# Patient Record
Sex: Female | Born: 1975
Health system: Southern US, Community
[De-identification: ages and names within clinical notes are randomized; demographics above are authoritative.]

## PROBLEM LIST (undated history)

## (undated) ENCOUNTER — Ambulatory Visit (HOSPITAL_COMMUNITY): Admission: EM | Payer: Commercial Managed Care - HMO

## (undated) DIAGNOSIS — E669 Obesity, unspecified: Secondary | ICD-10-CM

## (undated) DIAGNOSIS — G510 Bell's palsy: Secondary | ICD-10-CM

## (undated) DIAGNOSIS — E119 Type 2 diabetes mellitus without complications: Secondary | ICD-10-CM

## (undated) DIAGNOSIS — D649 Anemia, unspecified: Secondary | ICD-10-CM

## (undated) DIAGNOSIS — I10 Essential (primary) hypertension: Secondary | ICD-10-CM

## (undated) DIAGNOSIS — M722 Plantar fascial fibromatosis: Secondary | ICD-10-CM

## (undated) HISTORY — PX: WISDOM TOOTH EXTRACTION: SHX21

## (undated) HISTORY — PX: TUBAL LIGATION: SHX77

## (undated) HISTORY — DX: Obesity, unspecified: E66.9

## (undated) HISTORY — PX: DILATION AND CURETTAGE OF UTERUS: SHX78

---

## 2000-07-17 ENCOUNTER — Encounter: Payer: Self-pay | Admitting: Emergency Medicine

## 2000-07-17 ENCOUNTER — Emergency Department (HOSPITAL_COMMUNITY): Admission: EM | Admit: 2000-07-17 | Discharge: 2000-07-17 | Payer: Self-pay | Admitting: Emergency Medicine

## 2000-09-06 ENCOUNTER — Emergency Department (HOSPITAL_COMMUNITY): Admission: EM | Admit: 2000-09-06 | Discharge: 2000-09-06 | Payer: Self-pay

## 2001-02-16 ENCOUNTER — Inpatient Hospital Stay (HOSPITAL_COMMUNITY): Admission: AD | Admit: 2001-02-16 | Discharge: 2001-02-16 | Payer: Self-pay | Admitting: Obstetrics and Gynecology

## 2001-03-10 ENCOUNTER — Inpatient Hospital Stay (HOSPITAL_COMMUNITY): Admission: AD | Admit: 2001-03-10 | Discharge: 2001-03-10 | Payer: Self-pay | Admitting: Obstetrics and Gynecology

## 2001-06-01 ENCOUNTER — Emergency Department (HOSPITAL_COMMUNITY): Admission: EM | Admit: 2001-06-01 | Discharge: 2001-06-02 | Payer: Self-pay | Admitting: Unknown Physician Specialty

## 2001-06-03 ENCOUNTER — Emergency Department (HOSPITAL_COMMUNITY): Admission: EM | Admit: 2001-06-03 | Discharge: 2001-06-03 | Payer: Self-pay | Admitting: Emergency Medicine

## 2001-07-02 ENCOUNTER — Other Ambulatory Visit: Admission: RE | Admit: 2001-07-02 | Discharge: 2001-07-02 | Payer: Self-pay | Admitting: Obstetrics and Gynecology

## 2001-08-07 ENCOUNTER — Inpatient Hospital Stay (HOSPITAL_COMMUNITY): Admission: AD | Admit: 2001-08-07 | Discharge: 2001-08-07 | Payer: Self-pay | Admitting: Obstetrics and Gynecology

## 2001-08-20 ENCOUNTER — Ambulatory Visit (HOSPITAL_COMMUNITY): Admission: RE | Admit: 2001-08-20 | Discharge: 2001-08-20 | Payer: Self-pay | Admitting: Obstetrics and Gynecology

## 2001-08-20 ENCOUNTER — Encounter: Payer: Self-pay | Admitting: Obstetrics and Gynecology

## 2001-11-11 ENCOUNTER — Inpatient Hospital Stay (HOSPITAL_COMMUNITY): Admission: AD | Admit: 2001-11-11 | Discharge: 2001-11-11 | Payer: Self-pay | Admitting: Obstetrics and Gynecology

## 2001-11-20 ENCOUNTER — Inpatient Hospital Stay (HOSPITAL_COMMUNITY): Admission: AD | Admit: 2001-11-20 | Discharge: 2001-11-24 | Payer: Self-pay | Admitting: Obstetrics and Gynecology

## 2001-11-20 ENCOUNTER — Encounter (INDEPENDENT_AMBULATORY_CARE_PROVIDER_SITE_OTHER): Payer: Self-pay

## 2001-11-25 ENCOUNTER — Encounter: Admission: RE | Admit: 2001-11-25 | Discharge: 2001-12-25 | Payer: Self-pay | Admitting: Obstetrics and Gynecology

## 2001-12-26 ENCOUNTER — Encounter: Admission: RE | Admit: 2001-12-26 | Discharge: 2002-01-25 | Payer: Self-pay | Admitting: Obstetrics and Gynecology

## 2002-01-28 ENCOUNTER — Encounter: Payer: Self-pay | Admitting: Obstetrics and Gynecology

## 2002-01-28 ENCOUNTER — Inpatient Hospital Stay (HOSPITAL_COMMUNITY): Admission: AD | Admit: 2002-01-28 | Discharge: 2002-01-28 | Payer: Self-pay | Admitting: Obstetrics and Gynecology

## 2002-02-19 ENCOUNTER — Inpatient Hospital Stay (HOSPITAL_COMMUNITY): Admission: AD | Admit: 2002-02-19 | Discharge: 2002-02-19 | Payer: Self-pay | Admitting: Obstetrics and Gynecology

## 2002-02-22 ENCOUNTER — Encounter: Payer: Self-pay | Admitting: Obstetrics and Gynecology

## 2002-02-22 ENCOUNTER — Ambulatory Visit (HOSPITAL_COMMUNITY): Admission: RE | Admit: 2002-02-22 | Discharge: 2002-02-22 | Payer: Self-pay | Admitting: Obstetrics and Gynecology

## 2002-03-23 ENCOUNTER — Inpatient Hospital Stay (HOSPITAL_COMMUNITY): Admission: AD | Admit: 2002-03-23 | Discharge: 2002-03-23 | Payer: Self-pay | Admitting: Obstetrics and Gynecology

## 2002-06-21 ENCOUNTER — Emergency Department (HOSPITAL_COMMUNITY): Admission: EM | Admit: 2002-06-21 | Discharge: 2002-06-21 | Payer: Self-pay

## 2002-08-18 ENCOUNTER — Emergency Department (HOSPITAL_COMMUNITY): Admission: EM | Admit: 2002-08-18 | Discharge: 2002-08-18 | Payer: Self-pay

## 2002-08-18 ENCOUNTER — Encounter: Payer: Self-pay | Admitting: Emergency Medicine

## 2002-10-03 ENCOUNTER — Other Ambulatory Visit: Admission: RE | Admit: 2002-10-03 | Discharge: 2002-10-03 | Payer: Self-pay | Admitting: Obstetrics and Gynecology

## 2002-10-28 ENCOUNTER — Inpatient Hospital Stay (HOSPITAL_COMMUNITY): Admission: AD | Admit: 2002-10-28 | Discharge: 2002-10-28 | Payer: Self-pay | Admitting: Obstetrics and Gynecology

## 2002-11-13 ENCOUNTER — Inpatient Hospital Stay (HOSPITAL_COMMUNITY): Admission: AD | Admit: 2002-11-13 | Discharge: 2002-11-13 | Payer: Self-pay | Admitting: Obstetrics and Gynecology

## 2002-11-21 ENCOUNTER — Encounter: Payer: Self-pay | Admitting: Obstetrics and Gynecology

## 2002-11-21 ENCOUNTER — Ambulatory Visit (HOSPITAL_COMMUNITY): Admission: RE | Admit: 2002-11-21 | Discharge: 2002-11-21 | Payer: Self-pay | Admitting: Obstetrics and Gynecology

## 2003-02-03 ENCOUNTER — Inpatient Hospital Stay (HOSPITAL_COMMUNITY): Admission: AD | Admit: 2003-02-03 | Discharge: 2003-02-03 | Payer: Self-pay | Admitting: Obstetrics and Gynecology

## 2003-02-13 ENCOUNTER — Inpatient Hospital Stay (HOSPITAL_COMMUNITY): Admission: AD | Admit: 2003-02-13 | Discharge: 2003-02-14 | Payer: Self-pay | Admitting: Obstetrics and Gynecology

## 2003-02-14 ENCOUNTER — Inpatient Hospital Stay (HOSPITAL_COMMUNITY): Admission: AD | Admit: 2003-02-14 | Discharge: 2003-02-14 | Payer: Self-pay | Admitting: Obstetrics and Gynecology

## 2003-02-15 ENCOUNTER — Inpatient Hospital Stay (HOSPITAL_COMMUNITY): Admission: AD | Admit: 2003-02-15 | Discharge: 2003-02-15 | Payer: Self-pay | Admitting: Obstetrics and Gynecology

## 2003-02-18 ENCOUNTER — Inpatient Hospital Stay (HOSPITAL_COMMUNITY): Admission: AD | Admit: 2003-02-18 | Discharge: 2003-02-19 | Payer: Self-pay | Admitting: Obstetrics and Gynecology

## 2003-02-20 ENCOUNTER — Inpatient Hospital Stay (HOSPITAL_COMMUNITY): Admission: AD | Admit: 2003-02-20 | Discharge: 2003-02-20 | Payer: Self-pay | Admitting: Obstetrics and Gynecology

## 2003-03-24 ENCOUNTER — Inpatient Hospital Stay (HOSPITAL_COMMUNITY): Admission: AD | Admit: 2003-03-24 | Discharge: 2003-03-25 | Payer: Self-pay | Admitting: Obstetrics and Gynecology

## 2003-03-26 ENCOUNTER — Inpatient Hospital Stay (HOSPITAL_COMMUNITY): Admission: AD | Admit: 2003-03-26 | Discharge: 2003-03-26 | Payer: Self-pay | Admitting: Obstetrics and Gynecology

## 2003-03-31 ENCOUNTER — Inpatient Hospital Stay (HOSPITAL_COMMUNITY): Admission: AD | Admit: 2003-03-31 | Discharge: 2003-04-02 | Payer: Self-pay | Admitting: Obstetrics and Gynecology

## 2003-07-08 ENCOUNTER — Emergency Department (HOSPITAL_COMMUNITY): Admission: EM | Admit: 2003-07-08 | Discharge: 2003-07-08 | Payer: Self-pay | Admitting: Emergency Medicine

## 2004-05-29 ENCOUNTER — Emergency Department (HOSPITAL_COMMUNITY): Admission: EM | Admit: 2004-05-29 | Discharge: 2004-05-29 | Payer: Self-pay | Admitting: Family Medicine

## 2004-07-12 ENCOUNTER — Emergency Department (HOSPITAL_COMMUNITY): Admission: EM | Admit: 2004-07-12 | Discharge: 2004-07-12 | Payer: Self-pay | Admitting: Emergency Medicine

## 2005-11-29 ENCOUNTER — Emergency Department (HOSPITAL_COMMUNITY): Admission: EM | Admit: 2005-11-29 | Discharge: 2005-11-29 | Payer: Self-pay | Admitting: *Deleted

## 2005-12-27 ENCOUNTER — Emergency Department (HOSPITAL_COMMUNITY): Admission: EM | Admit: 2005-12-27 | Discharge: 2005-12-27 | Payer: Self-pay | Admitting: Emergency Medicine

## 2006-06-09 ENCOUNTER — Other Ambulatory Visit: Admission: RE | Admit: 2006-06-09 | Discharge: 2006-06-09 | Payer: Self-pay | Admitting: Family Medicine

## 2006-08-30 ENCOUNTER — Emergency Department (HOSPITAL_COMMUNITY): Admission: EM | Admit: 2006-08-30 | Discharge: 2006-08-30 | Payer: Self-pay | Admitting: Emergency Medicine

## 2006-08-31 ENCOUNTER — Encounter: Admission: RE | Admit: 2006-08-31 | Discharge: 2006-08-31 | Payer: Self-pay | Admitting: Family Medicine

## 2006-12-07 ENCOUNTER — Emergency Department (HOSPITAL_COMMUNITY): Admission: EM | Admit: 2006-12-07 | Discharge: 2006-12-07 | Payer: Self-pay | Admitting: Emergency Medicine

## 2007-08-26 ENCOUNTER — Emergency Department (HOSPITAL_COMMUNITY): Admission: EM | Admit: 2007-08-26 | Discharge: 2007-08-26 | Payer: Self-pay | Admitting: Emergency Medicine

## 2008-02-26 ENCOUNTER — Emergency Department (HOSPITAL_COMMUNITY): Admission: EM | Admit: 2008-02-26 | Discharge: 2008-02-26 | Payer: Self-pay | Admitting: Emergency Medicine

## 2008-12-13 ENCOUNTER — Emergency Department (HOSPITAL_COMMUNITY): Admission: EM | Admit: 2008-12-13 | Discharge: 2008-12-13 | Payer: Self-pay | Admitting: Family Medicine
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
## 2010-10-31 ENCOUNTER — Emergency Department (HOSPITAL_COMMUNITY)
Admission: EM | Admit: 2010-10-31 | Discharge: 2010-10-31 | Disposition: A | Discharge status: Home / Self Care | Payer: Medicaid Other | Attending: Emergency Medicine | Admitting: Emergency Medicine

## 2010-10-31 DIAGNOSIS — M25539 Pain in unspecified wrist: Secondary | ICD-10-CM | POA: Insufficient documentation

## 2010-10-31 DIAGNOSIS — L2989 Other pruritus: Secondary | ICD-10-CM | POA: Insufficient documentation

## 2010-10-31 DIAGNOSIS — M25439 Effusion, unspecified wrist: Secondary | ICD-10-CM | POA: Insufficient documentation

## 2010-10-31 DIAGNOSIS — IMO0002 Reserved for concepts with insufficient information to code with codable children: Secondary | ICD-10-CM | POA: Insufficient documentation

## 2010-10-31 DIAGNOSIS — L298 Other pruritus: Secondary | ICD-10-CM | POA: Insufficient documentation

## 2010-10-31 DIAGNOSIS — IMO0001 Reserved for inherently not codable concepts without codable children: Secondary | ICD-10-CM | POA: Insufficient documentation

## 2010-12-25 ENCOUNTER — Inpatient Hospital Stay (INDEPENDENT_AMBULATORY_CARE_PROVIDER_SITE_OTHER)
Admission: RE | Admit: 2010-12-25 | Discharge: 2010-12-25 | Disposition: A | Discharge status: Home / Self Care | Payer: Medicaid Other | Source: Ambulatory Visit | Attending: Emergency Medicine | Admitting: Emergency Medicine

## 2010-12-25 DIAGNOSIS — S139XXA Sprain of joints and ligaments of unspecified parts of neck, initial encounter: Secondary | ICD-10-CM

## 2010-12-25 DIAGNOSIS — M799 Soft tissue disorder, unspecified: Secondary | ICD-10-CM

## 2011-02-15 LAB — URINE MICROSCOPIC-ADD ON

## 2011-02-15 LAB — CBC
MCHC: 33.3
MCV: 81.8
RDW: 13.5

## 2011-02-15 LAB — POCT PREGNANCY, URINE: Preg Test, Ur: NEGATIVE

## 2011-02-15 LAB — POCT I-STAT, CHEM 8
Calcium, Ion: 1.16
Creatinine, Ser: 1
Glucose, Bld: 82
Hemoglobin: 13.6
Sodium: 141
TCO2: 27

## 2011-02-15 LAB — URINALYSIS, ROUTINE W REFLEX MICROSCOPIC
Bilirubin Urine: NEGATIVE
Leukocytes, UA: NEGATIVE
Nitrite: NEGATIVE
Specific Gravity, Urine: 1.026
pH: 6

## 2011-02-15 LAB — DIFFERENTIAL
Basophils Absolute: 0.1
Basophils Relative: 1
Eosinophils Absolute: 0.3
Neutro Abs: 3.9
Neutrophils Relative %: 48

## 2011-02-21 LAB — URINALYSIS, ROUTINE W REFLEX MICROSCOPIC
Bilirubin Urine: NEGATIVE
Glucose, UA: NEGATIVE
Ketones, ur: NEGATIVE
Nitrite: NEGATIVE
Protein, ur: NEGATIVE

## 2011-02-21 LAB — CBC
HCT: 36.3
Hemoglobin: 12.2
MCHC: 33.5
Platelets: 300
RDW: 13.4

## 2011-02-21 LAB — POCT I-STAT, CHEM 8
BUN: 5 — ABNORMAL LOW
Calcium, Ion: 1.12
HCT: 38
Hemoglobin: 12.9
Sodium: 139
TCO2: 27

## 2011-02-21 LAB — DIFFERENTIAL
Basophils Absolute: 0
Basophils Relative: 1
Eosinophils Relative: 4
Lymphocytes Relative: 43
Monocytes Absolute: 0.5

## 2011-02-21 LAB — GLUCOSE, CAPILLARY

## 2011-02-25 ENCOUNTER — Other Ambulatory Visit: Payer: Self-pay | Admitting: Family Medicine

## 2011-02-25 DIAGNOSIS — Z803 Family history of malignant neoplasm of breast: Secondary | ICD-10-CM

## 2011-02-25 DIAGNOSIS — Z1231 Encounter for screening mammogram for malignant neoplasm of breast: Secondary | ICD-10-CM

## 2011-03-23 ENCOUNTER — Ambulatory Visit: Payer: Medicaid Other

## 2011-09-11 ENCOUNTER — Emergency Department (HOSPITAL_COMMUNITY)
Admission: EM | Admit: 2011-09-11 | Discharge: 2011-09-12 | Disposition: A | Discharge status: Home / Self Care | Payer: Medicaid Other | Attending: Emergency Medicine | Admitting: Emergency Medicine

## 2011-09-11 ENCOUNTER — Encounter (HOSPITAL_COMMUNITY): Payer: Self-pay | Admitting: Emergency Medicine

## 2011-09-11 DIAGNOSIS — IMO0002 Reserved for concepts with insufficient information to code with codable children: Secondary | ICD-10-CM | POA: Insufficient documentation

## 2011-09-11 DIAGNOSIS — S0093XA Contusion of unspecified part of head, initial encounter: Secondary | ICD-10-CM

## 2011-09-11 DIAGNOSIS — S0003XA Contusion of scalp, initial encounter: Secondary | ICD-10-CM | POA: Insufficient documentation

## 2011-09-11 DIAGNOSIS — R51 Headache: Secondary | ICD-10-CM | POA: Insufficient documentation

## 2011-09-11 DIAGNOSIS — Y92009 Unspecified place in unspecified non-institutional (private) residence as the place of occurrence of the external cause: Secondary | ICD-10-CM | POA: Insufficient documentation

## 2011-09-11 DIAGNOSIS — M542 Cervicalgia: Secondary | ICD-10-CM | POA: Insufficient documentation

## 2011-09-11 DIAGNOSIS — S139XXA Sprain of joints and ligaments of unspecified parts of neck, initial encounter: Secondary | ICD-10-CM | POA: Insufficient documentation

## 2011-09-11 DIAGNOSIS — S161XXA Strain of muscle, fascia and tendon at neck level, initial encounter: Secondary | ICD-10-CM

## 2011-09-11 NOTE — ED Notes (Signed)
Pt states she was moving into a new apartment and a cabinet fell and hit the L side of her head/forehead.  C/o pain to head and neck pain.  Denies LOC.

## 2011-09-12 ENCOUNTER — Emergency Department (HOSPITAL_COMMUNITY): Payer: Medicaid Other

## 2011-09-12 ENCOUNTER — Encounter (HOSPITAL_COMMUNITY): Payer: Self-pay | Admitting: Radiology

## 2011-09-12 MED ORDER — OXYCODONE-ACETAMINOPHEN 5-325 MG PO TABS: 2.0000 | ORAL_TABLET | Freq: Once | ORAL | Status: AC

## 2011-09-12 MED ORDER — METAXALONE 800 MG PO TABS: 400.0000 mg | ORAL_TABLET | Freq: Three times a day (TID) | ORAL | Status: AC

## 2011-09-12 MED ORDER — OXYCODONE-ACETAMINOPHEN 5-325 MG PO TABS
2.0000 | ORAL_TABLET | Freq: Once | ORAL | Status: AC
  Administered 2011-09-12: 2 via ORAL
  Filled 2011-09-12: qty 2

## 2011-09-12 NOTE — ED Notes (Signed)
Patient transported to X-ray 

## 2011-09-12 NOTE — ED Provider Notes (Signed)
Medical screening examination/treatment/procedure(s) were performed by non-physician practitioner and as supervising physician I was immediately available for consultation/collaboration.  Jasmine Awe, MD 09/12/11 [phone]
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
## 2011-09-12 NOTE — ED Provider Notes (Signed)
History     CSN: [id number]  Arrival date & time 09/11/11  2225   First MD Initiated Contact with Patient 09/11/11 2358      Chief Complaint  Patient presents with  . Head Injury    (Consider location/radiation/quality/duration/timing/severity/associated sxs/prior treatment) HPI Comments: Patient states she was moving into a new apartment when a wall.  Cabinet fell, striking her on the left for head.  She is now complaining of headache, nausea, that has resolved, and neck pain.  She has not any over-the-counter medication, denies loss of consciousness  Patient is a 36 y.o. female presenting with head injury. The history is provided by the patient.  Head Injury  The incident occurred 1 to 2 hours ago. She came to the ER via walk-in. The injury mechanism was a direct blow. There was no loss of consciousness. There was no blood loss. The quality of the pain is described as throbbing. The pain is at a severity of 6/10. The pain is moderate. The pain has been constant since the injury. Pertinent negatives include no vomiting, no weakness and no memory loss. She has tried nothing for the symptoms.    History reviewed. No pertinent past medical history.  Past Surgical History  Procedure Date  . Tubal ligation     No family history on file.  History  Substance Use Topics  . Smoking status: Never Smoker   . Smokeless tobacco: Not on file  . Alcohol Use: No    OB History    Grav Para Term Preterm Abortions TAB SAB Ect Mult Living                  Review of Systems  Constitutional: Negative for fever.  Eyes: Negative for visual disturbance.  Gastrointestinal: Negative for vomiting.  Musculoskeletal: Negative for joint swelling.  Neurological: Positive for headaches. Negative for dizziness and weakness.  Psychiatric/Behavioral: Negative for memory loss.    Allergies  Review of patient's allergies indicates no known allergies.  Home Medications  No current outpatient  prescriptions on file.  BP 124/81  Pulse 83  Temp(Src) 98.5 F (36.9 C) (Oral)  Resp 18  SpO2 100%  LMP 09/09/2011  Physical Exam  Constitutional: She is oriented to person, place, and time. She appears well-developed and well-nourished.  HENT:  Head: Normocephalic. Head is with contusion. Head is without abrasion and without laceration.  Neck: Spinous process tenderness present.  Cardiovascular: Normal rate.   Pulmonary/Chest: Effort normal.  Abdominal: Soft.  Musculoskeletal: Normal range of motion.  Neurological: She is alert and oriented to person, place, and time.  Skin: Skin is warm.    ED Course  Procedures (including critical care time)  Labs Reviewed - No data to display No results found.   No diagnosis found.    MDM  Patient has midline cervical spine tenderness and a small hematoma on the left for head.  Will CT head and neck to evaluate for intracranial injury, as well as cervical spine injury, although this is doubtful.  While patient is awaiting scans and x-ray will provide pain medication in the form of Percocet CT scan and x-rays are normal       Arman Filter, NP 09/12/11 0058  Arman Filter, NP 09/12/11 0100

## 2012-11-09 ENCOUNTER — Encounter (HOSPITAL_COMMUNITY): Payer: Self-pay | Admitting: Emergency Medicine

## 2012-11-09 ENCOUNTER — Emergency Department (INDEPENDENT_AMBULATORY_CARE_PROVIDER_SITE_OTHER): Payer: Medicaid Other

## 2012-11-09 ENCOUNTER — Emergency Department (HOSPITAL_COMMUNITY)
Admission: EM | Admit: 2012-11-09 | Discharge: 2012-11-09 | Disposition: A | Discharge status: Home / Self Care | Payer: Medicaid Other | Source: Home / Self Care | Attending: Family Medicine | Admitting: Family Medicine

## 2012-11-09 DIAGNOSIS — S9030XA Contusion of unspecified foot, initial encounter: Secondary | ICD-10-CM

## 2012-11-09 DIAGNOSIS — S9032XA Contusion of left foot, initial encounter: Secondary | ICD-10-CM

## 2012-11-09 HISTORY — DX: Plantar fascial fibromatosis: M72.2

## 2012-11-09 MED ORDER — IBUPROFEN 600 MG PO TABS: 600.0000 mg | ORAL_TABLET | Freq: Three times a day (TID) | ORAL | Status: DC | PRN

## 2012-11-09 MED ORDER — TRAMADOL HCL 50 MG PO TABS: 50.0000 mg | ORAL_TABLET | Freq: Four times a day (QID) | ORAL | Status: DC | PRN

## 2012-11-09 NOTE — ED Notes (Signed)
Left foot pain and swelling .  Pain in top of left foot, at the base of toes, into great toe.  Patient reports dropping can on left foot.

## 2012-11-09 NOTE — ED Provider Notes (Signed)
History     CSN: [id number]  Arrival date & time 11/09/12  1303   First MD Initiated Contact with Patient 11/09/12 1339      Chief Complaint  Patient presents with  . Foot Pain    (Consider location/radiation/quality/duration/timing/severity/associated sxs/prior treatment) HPI Comments: 37 year old female here complaining of left foot pain and swelling. Patient states she had it can drop over her left foot about a week ago. Area has been stat tender and swollen things. Not taking any medications for her symptoms. Uncomfortable with walking but no limping.  Patient is a 37 y.o. female presenting with lower extremity pain.  Foot Pain    Past Medical History  Diagnosis Date  . Plantar fasciitis, bilateral     Past Surgical History  Procedure Laterality Date  . Tubal ligation      No family history on file.  History  Substance Use Topics  . Smoking status: Never Smoker   . Smokeless tobacco: Not on file  . Alcohol Use: No    OB History   Grav Para Term Preterm Abortions TAB SAB Ect Mult Living                  Review of Systems  Constitutional: Negative for fever and chills.  Musculoskeletal:       Left foot pain as per HPI  Skin: Negative for wound.  Neurological: Negative for weakness and numbness.    Allergies  Review of patient's allergies indicates no known allergies.  Home Medications   Current Outpatient Rx  Name  Route  Sig  Dispense  Refill  . acetaminophen (TYLENOL) 325 MG tablet   Oral   Take 650 mg by mouth every 6 (six) hours as needed for pain.         Marland Kitchen ibuprofen (ADVIL,MOTRIN) 600 MG tablet   Oral   Take 1 tablet (600 mg total) by mouth every 8 (eight) hours as needed for pain (take with food).   21 tablet   0   . traMADol (ULTRAM) 50 MG tablet   Oral   Take 1 tablet (50 mg total) by mouth every 6 (six) hours as needed for pain.   15 tablet   0     BP 128/83  Pulse 90  Temp(Src) 98.3 F (36.8 C)  Resp 18  SpO2 99%   LMP 11/09/2012  Physical Exam  Nursing note and vitals reviewed. Constitutional: She is oriented to person, place, and time. She appears well-developed and well-nourished. No distress.  Cardiovascular: Normal heart sounds.   Pulmonary/Chest: Breath sounds normal.  Musculoskeletal:  Left foot: no swelling or deformity when compared with right foot. Reported diffused tenderness with palpation over lateral dorsum of the foot pain increased with dorsiflexion. No crepitus or irregularities. No ankle laxity. Entire right foot appears neurovascularly intact.   Neurological: She is alert and oriented to person, place, and time.  Skin: No rash noted. She is not diaphoretic.  No bruising, rash, abrasions or wounds.    ED Course  Procedures (including critical care time)  Labs Reviewed - No data to display Dg Foot Complete Left  11/09/2012   *RADIOLOGY REPORT*  Clinical Data: Injury to left foot.  Left foot pain.  LEFT FOOT - COMPLETE 3+ VIEW  Comparison: None.  Findings: No fracture.  The joints normally spaced and aligned. The soft tissues are unremarkable.  IMPRESSION: No fracture or joint abnormality.   Original Report Authenticated By: Amie Portland, M.D.     1.  Foot contusion, left, initial encounter       MDM  No significant swelling compared with right foot. No fractures on x-rays. Placed on rigid soled shoe. Prescribed ibuprofen and tramadol.     Sharin Grave, MD 11/10/12 1652

## 2013-01-24 ENCOUNTER — Emergency Department (HOSPITAL_COMMUNITY): Payer: Medicaid Other

## 2013-01-24 ENCOUNTER — Encounter (HOSPITAL_COMMUNITY): Payer: Self-pay | Admitting: Neurology

## 2013-01-24 ENCOUNTER — Emergency Department (HOSPITAL_COMMUNITY)
Admission: EM | Admit: 2013-01-24 | Discharge: 2013-01-24 | Disposition: A | Discharge status: Home / Self Care | Payer: Medicaid Other | Attending: Emergency Medicine | Admitting: Emergency Medicine

## 2013-01-24 DIAGNOSIS — R300 Dysuria: Secondary | ICD-10-CM | POA: Insufficient documentation

## 2013-01-24 DIAGNOSIS — Z8739 Personal history of other diseases of the musculoskeletal system and connective tissue: Secondary | ICD-10-CM | POA: Insufficient documentation

## 2013-01-24 DIAGNOSIS — R1011 Right upper quadrant pain: Secondary | ICD-10-CM | POA: Insufficient documentation

## 2013-01-24 DIAGNOSIS — Z9851 Tubal ligation status: Secondary | ICD-10-CM | POA: Insufficient documentation

## 2013-01-24 DIAGNOSIS — R112 Nausea with vomiting, unspecified: Secondary | ICD-10-CM

## 2013-01-24 DIAGNOSIS — R1013 Epigastric pain: Secondary | ICD-10-CM | POA: Insufficient documentation

## 2013-01-24 HISTORY — DX: Type 2 diabetes mellitus without complications: E11.9

## 2013-01-24 LAB — COMPREHENSIVE METABOLIC PANEL
ALT: 16 U/L (ref 0–35)
AST: 15 U/L (ref 0–37)
Albumin: 3.6 g/dL (ref 3.5–5.2)
Alkaline Phosphatase: 71 U/L (ref 39–117)
Calcium: 9.1 mg/dL (ref 8.4–10.5)
GFR calc Af Amer: >90 mL/min (ref >90)
Potassium: 4.3 mEq/L (ref 3.5–5.1)
Sodium: 136 mEq/L (ref 135–145)
Total Protein: 7.7 g/dL (ref 6.0–8.3)

## 2013-01-24 LAB — URINALYSIS, ROUTINE W REFLEX MICROSCOPIC
Leukocytes, UA: NEGATIVE
Nitrite: NEGATIVE
Specific Gravity, Urine: 1.015 (ref 1.005–1.030)
Urobilinogen, UA: 0.2 mg/dL (ref 0.0–1.0)
pH: 6.5 (ref 5.0–8.0)

## 2013-01-24 LAB — CBC WITH DIFFERENTIAL/PLATELET
Basophils Relative: 0 % (ref 0–1)
Eosinophils Relative: 4 % (ref 0–5)
HCT: 39 % (ref 36.0–46.0)
Hemoglobin: 12.9 g/dL (ref 12.0–15.0)
MCHC: 33.1 g/dL (ref 30.0–36.0)
MCV: 81.6 fL (ref 78.0–100.0)
Monocytes Absolute: 0.5 10*3/uL (ref 0.1–1.0)
Monocytes Relative: 6 % (ref 3–12)
Neutro Abs: 3.2 10*3/uL (ref 1.7–7.7)

## 2013-01-24 LAB — WET PREP, GENITAL
Clue Cells Wet Prep HPF POC: NONE SEEN
Trich, Wet Prep: NONE SEEN

## 2013-01-24 LAB — URINE MICROSCOPIC-ADD ON

## 2013-01-24 LAB — POCT PREGNANCY, URINE: Preg Test, Ur: NEGATIVE

## 2013-01-24 MED ORDER — ONDANSETRON HCL 4 MG PO TABS: 4.0000 mg | ORAL_TABLET | Freq: Three times a day (TID) | ORAL | Status: DC | PRN

## 2013-01-24 MED ORDER — IOHEXOL 300 MG/ML  SOLN
25.0000 mL | INTRAMUSCULAR | Status: AC
  Administered 2013-01-24: 25 mL via ORAL

## 2013-01-24 MED ORDER — IOHEXOL 300 MG/ML  SOLN
100.0000 mL | Freq: Once | INTRAMUSCULAR | Status: AC | PRN
  Administered 2013-01-24: 80 mL via INTRAVENOUS

## 2013-01-24 NOTE — ED Notes (Signed)
Pt discharged.Vital signs stable and GCS 15 

## 2013-01-24 NOTE — ED Provider Notes (Signed)
I saw and evaluated the patient, reviewed the resident's note and I agree with the findings and plan.   Dagmar Hait, MD 01/24/13 [phone]

## 2013-01-24 NOTE — ED Notes (Signed)
Pt reporting RLQ side, flank and back pain since this morning. Also pain when she urinates. Yesterday was vomiting. Today is nauseated. Pt is a x 4

## 2013-01-24 NOTE — ED Provider Notes (Signed)
CSN: [id number]     Arrival date & time 01/24/13  0759 History     Chief Complaint  Patient presents with  . Flank Pain   HPI Comments: Pt is a 37 y/o female with PMHx of D&C in 1999 w/ Tubal Ligation who presents to the ED with RUQ pain, R flank pain, and suprapubic/groin pain with urination.  Pt was in her normal state of health up until two days ago when she started to not feel well.  Yesterday, pt became nauseated and had multiple episodes of nonbloody/non bilious vomitus, abdominal pain.  She was not able to keep PO down and progressed to have suprapubic pain and dysuria today when she decided to come into the ED.  She denies sick contacts, recent travel, fever, chills, sweats, CVA pain, pain with deep inspiration or referred to her shoulder, hematuria, melena, hematochezia, diarrhea, constipation, RUQ pain with fatty meals, vague periumbilical pain with radiation to the RLQ, recent vaginal discharge/sexual intercourse.  She does not have Hx of appendectomy or cholecystectomy.  Does state her menses are irregular and her FDLMP was 01/10/13, but thought maybe the pain was related to the onset.    The history is provided by the patient.    Past Medical History  Diagnosis Date  . Plantar fasciitis, bilateral    Past Surgical History  Procedure Laterality Date  . Tubal ligation     No family history on file. History  Substance Use Topics  . Smoking status: Never Smoker   . Smokeless tobacco: Not on file  . Alcohol Use: No   OB History   Grav Para Term Preterm Abortions TAB SAB Ect Mult Living                 Review of Systems  Constitutional: Positive for appetite change. Negative for fever, chills, diaphoresis, fatigue and unexpected weight change.  Eyes: Negative.   Respiratory: Negative.   Cardiovascular: Negative.   Gastrointestinal: Positive for nausea, vomiting and abdominal pain (epigastric, RUQ, suprapubic, radiation to R groin). Negative for diarrhea, constipation, blood  in stool, abdominal distention, anal bleeding and rectal pain.  Endocrine: Negative.   Genitourinary: Positive for dysuria and flank pain. Negative for frequency, hematuria, decreased urine volume, vaginal bleeding, vaginal discharge, difficulty urinating, vaginal pain, menstrual problem and pelvic pain.  Musculoskeletal: Negative.   Skin: Negative.   Allergic/Immunologic: Negative.   Neurological: Negative.  Negative for headaches.  Hematological: Negative.   Psychiatric/Behavioral: Negative.     Allergies  Review of patient's allergies indicates no known allergies.  Home Medications   No current outpatient prescriptions on file. There were no vitals taken for this visit. Physical Exam  Constitutional: She is oriented to person, place, and time. She appears well-developed and well-nourished. No distress.  HENT:  Head: Normocephalic and atraumatic.  Mouth/Throat: Oropharynx is clear and moist.  Eyes: EOM are normal. No scleral icterus.  Neck: Normal range of motion. Neck supple.  Cardiovascular: Normal rate, regular rhythm and normal heart sounds.   No murmur heard. Pulmonary/Chest: Effort normal and breath sounds normal.  Abdominal: Soft. Bowel sounds are normal. She exhibits no distension and no pulsatile liver. There is no hepatosplenomegaly. There is tenderness (RUQ (worse with inspiration, but no cessation), epigastric, suprapubic with slight radiation to R groin/RLQ). There is positive Murphy's sign. There is no rigidity, no rebound, no guarding, no CVA tenderness and no tenderness at McBurney's point. No hernia.  Negative Rovsing Sign   Genitourinary: Vagina normal and uterus normal.  There is no rash, tenderness, lesion or injury on the right labia. There is no rash, tenderness, lesion or injury on the left labia. Cervix exhibits no motion tenderness, no discharge and no friability. Right adnexum displays no mass, no tenderness and no fullness. Left adnexum displays no mass, no  tenderness and no fullness.  Musculoskeletal: Normal range of motion.  Lymphadenopathy:    She has no cervical adenopathy.  Neurological: She is alert and oriented to person, place, and time.  Skin: Skin is warm and dry. No rash noted. No erythema.  Psychiatric: She has a normal mood and affect. Her behavior is normal.    ED Course  Pelvic exam Date/Time: 01/24/2013 8:43 AM Performed by: Briscoe Deutscher Authorized by: Dagmar Hait Consent: Verbal consent obtained. written consent not obtained. Risks and benefits: risks, benefits and alternatives were discussed Consent given by: patient Required items: required blood products, implants, devices, and special equipment available Patient identity confirmed: verbally with patient Local anesthesia used: no Patient sedated: no Patient tolerance: Patient tolerated the procedure well with no immediate complications.   (including critical care time) Labs Review Labs Reviewed  WET PREP, GENITAL - Abnormal; Notable for the following:    WBC, Wet Prep HPF POC MANY (*)    All other components within normal limits  URINALYSIS, ROUTINE W REFLEX MICROSCOPIC - Abnormal; Notable for the following:    Hgb urine dipstick SMALL (*)    All other components within normal limits  COMPREHENSIVE METABOLIC PANEL - Abnormal; Notable for the following:    Glucose, Bld 108 (*)    GFR calc non Af Amer 88 (*)    All other components within normal limits  CBC WITH DIFFERENTIAL - Abnormal; Notable for the following:    Neutrophils Relative % 42 (*)    Lymphocytes Relative 47 (*)    All other components within normal limits  URINE MICROSCOPIC-ADD ON - Abnormal; Notable for the following:    Squamous Epithelial / LPF FEW (*)    All other components within normal limits  LIPASE, BLOOD  MISCELLANEOUS TEST  POCT PREGNANCY, URINE  CERVICOVAGINAL ANCILLARY ONLY   Imaging Review US Abdomen Complete  01/24/2013   *RADIOLOGY REPORT*  Abdominal ultrasound   History: Abdominal pain  Comparison: CT abdomen September 01, 2006  Findings:  Gallbladder is visualized in multiple projections. There are no gallstones, gallbladder wall thickening, or pericholecystic fluid collection.  There is no intrahepatic, common hepatic, or common bile duct dilatation.  Pancreas appears normal.  There is diffuse increased echogenicity throughout the liver.  No focal liver lesions are identified on this study.  Spleen is normal in size and homogeneous in echotexture.  Kidneys bilaterally appear normal.  There is no ascites.  Aorta is nonaneurysmal.  Inferior vena cava appears normal.  Conclusion:  Diffuse increased echogenicity of the liver, probably indicative of diffuse fatty change.  While no focal liver lesions are identified, it must be cautioned that the sensitivity of ultrasound for focal liver lesions is diminished given this degree of increased echogenicity.  Study otherwise unremarkable.   Original Report Authenticated By: Bretta Bang, M.D.   Ct Abdomen Pelvis W Contrast  01/24/2013   *RADIOLOGY REPORT*  Clinical Data: Right lower quadrant pain and right flank pain since early this morning.  Pain upon urination.  Nausea vomiting.  Denies fever.  History of tubal ligation.  CT ABDOMEN AND PELVIS WITH CONTRAST  Technique:  Multidetector CT imaging of the abdomen and pelvis was performed following  the standard protocol during bolus administration of intravenous contrast.  Contrast: 80mL OMNIPAQUE IOHEXOL 300 MG/ML  SOLN  Comparison: 09/01/2006.  Findings: No extraluminal bowel inflammatory process, free fluid or free air.  Specifically, no inflammation noted surrounding the appendix.  Portions of the bowel are under distended with slight wall thickening (ascending colon).  Elongated fatty liver without focal worrisome hepatic lesion.  No splenic, pancreatic, renal or adrenal abnormality.  Distended gallbladder without calcified gallstones or CT evidence of inflammation.  If this  were of high clinical concern then ultrasound can be obtained for further delineation.  Lung bases clear.  No abdominal aortic aneurysm.  Tiny calcification in the left internal iliac artery may represent slightly advanced atherosclerotic type changes for the patient's age.  No adenopathy.  No bowel containing hernia.  Noncontrast filled views of the urinary bladder unremarkable. Uterus unremarkable.  Post tubal ligation.  Bilateral ovarian cysts measuring up to 3.9 cm on the right with left ovary containing 2 cysts side-by-side or single cyst with a septation measuring up to 4.7 cm.  This can be evaluated by follow- up pelvic sonogram if clinically desired.  No bony destructive lesion.  Mild sacroiliac joint degenerative changes.  IMPRESSION: No extraluminal bowel inflammatory process, free fluid or free air. Specifically, no inflammation noted surrounding the appendix.  Elongated fatty liver without focal worrisome hepatic lesion.  Distended gallbladder without calcified gallstones or CT evidence of inflammation.  Tiny calcification in the left internal iliac artery may represent slightly advanced atherosclerotic type changes for the patient's age.  Bilateral ovarian cysts measuring up to 3.9 cm on the right with left ovary containing 2 cysts side-by-side or single cyst with a septation measuring up to 4.7 cm.  This can be evaluated by follow- up pelvic sonogram if clinically desired.   Original Report Authenticated By: Lacy Duverney, M.D.    MDM   1. RUQ abdominal pain   2. Nausea & vomiting   Differential is quite broad and includes GI source including viral/bacterial gastroenteritis (no fever, bloody stool), cholecystitis, biliary colic, hepatic source, pancreatitis, appendicitis.  Will get Lipase, CMET, RUQ [place] as cholecystitis/biliary colic are high on the differential.  If these are negative, will consider CT abdomen to r/o appendicitis.  GU complaints include pyelo (no systemic Sx), UTI, STD, BV,  pregnancy.  Will get U preg, UA, GC/Chlamydia, and do speculum/manual exam to r/o possible causes.    9:30 AM - [place] negative for cholecystitis or chololithiasis, Labs unremarkable to this point.  Speculum exam w/o frank pus, and no CMT.  Will get CT abdomen w/ contrast to r/o appendicitis as pt continuing to have some RLQ tenderness.   12:15 PM - CT abdomen pelvis w/o free fluid or appendix inflammation.  Most likely viral gastro or biliary colic causing her pain/nausea.  Will tx symptomatically with zofran and ibuprofen/tylenol PRN. Pt will be d/c home w/ specific instructions on when to return and when to follow up with her PCP.   Twana First Paulina Fusi, DO of [facility] 01/24/2013, 12:16 PM      Briscoe Deutscher, DO 01/24/13 1216

## 2013-01-25 LAB — MISCELLANEOUS TEST: Miscellaneous Test: 5990

## 2013-05-14 ENCOUNTER — Emergency Department (HOSPITAL_COMMUNITY)
Admission: EM | Admit: 2013-05-14 | Discharge: 2013-05-14 | Disposition: A | Discharge status: Home / Self Care | Payer: Medicaid Other | Attending: Emergency Medicine | Admitting: Emergency Medicine

## 2013-05-14 ENCOUNTER — Encounter (HOSPITAL_COMMUNITY): Payer: Self-pay | Admitting: Emergency Medicine

## 2013-05-14 ENCOUNTER — Emergency Department (HOSPITAL_COMMUNITY): Payer: Medicaid Other

## 2013-05-14 DIAGNOSIS — R209 Unspecified disturbances of skin sensation: Secondary | ICD-10-CM | POA: Insufficient documentation

## 2013-05-14 DIAGNOSIS — E119 Type 2 diabetes mellitus without complications: Secondary | ICD-10-CM | POA: Insufficient documentation

## 2013-05-14 DIAGNOSIS — R002 Palpitations: Secondary | ICD-10-CM | POA: Insufficient documentation

## 2013-05-14 DIAGNOSIS — R0789 Other chest pain: Secondary | ICD-10-CM | POA: Insufficient documentation

## 2013-05-14 LAB — POCT I-STAT, CHEM 8
Calcium, Ion: 1.19 mmol/L (ref 1.12–1.23)
Creatinine, Ser: 0.8 mg/dL (ref 0.50–1.10)
Glucose, Bld: 102 mg/dL — ABNORMAL HIGH (ref 70–99)
Hemoglobin: 12.6 g/dL (ref 12.0–15.0)
Potassium: 3.7 mEq/L (ref 3.5–5.1)
TCO2: 22 mmol/L (ref 0–100)

## 2013-05-14 MED ORDER — IBUPROFEN 800 MG PO TABS
800.0000 mg | ORAL_TABLET | Freq: Once | ORAL | Status: AC
  Administered 2013-05-14: 800 mg via ORAL
  Filled 2013-05-14: qty 1

## 2013-05-14 NOTE — ED Provider Notes (Signed)
CSN: [id number]     Arrival date & time 05/14/13  0203 History   First MD Initiated Contact with Patient 05/14/13 0316     Chief Complaint  Patient presents with  . Hyperventilating   (Consider location/radiation/quality/duration/timing/severity/associated sxs/prior Treatment) HPI Comments: 37 yo female with panic attack hx presents after episode of chest pressure, heart racing, numbness both arms and breathing fast.  Sxs have resolved on exam except mild chest pressure.  No cardiac risks.  No PE or recent surgery.  Non radiating, mild anterior.  No exertional or diaphoresis.  No leg swelling or pain.  No cardiac hx.   The history is provided by the patient.    Past Medical History  Diagnosis Date  . Plantar fasciitis, bilateral   . Diabetes mellitus without complication    Past Surgical History  Procedure Laterality Date  . Tubal ligation    . Dilation and curettage of uterus     No family history on file. History  Substance Use Topics  . Smoking status: Never Smoker   . Smokeless tobacco: Not on file  . Alcohol Use: No   OB History   Grav Para Term Preterm Abortions TAB SAB Ect Mult Living                 Review of Systems  Constitutional: Negative for fever and chills.  HENT: Negative for congestion.   Eyes: Negative for visual disturbance.  Respiratory: Negative for cough and shortness of breath.   Cardiovascular: Positive for chest pain (pressure) and palpitations.  Gastrointestinal: Negative for vomiting, abdominal pain and blood in stool.  Genitourinary: Negative for dysuria and flank pain.  Musculoskeletal: Negative for back pain, neck pain and neck stiffness.  Skin: Negative for rash.  Neurological: Positive for numbness. Negative for light-headedness and headaches.    Allergies  Bee venom  Home Medications   Current Outpatient Rx  Name  Route  Sig  Dispense  Refill  . EPINEPHrine (EPIPEN) 0.3 mg/0.3 mL SOAJ injection   Intramuscular   Inject 0.3 mg  into the muscle once as needed (for allergic reaction).          BP 115/72  Pulse 81  Temp(Src) 98.5 F (36.9 C) (Oral)  Resp 17  SpO2 98% Physical Exam  Nursing note and vitals reviewed. Constitutional: She is oriented to person, place, and time. She appears well-developed and well-nourished.  HENT:  Head: Normocephalic and atraumatic.  Eyes: Conjunctivae are normal. Right eye exhibits no discharge. Left eye exhibits no discharge.  Neck: Normal range of motion. Neck supple. No tracheal deviation present.  Cardiovascular: Normal rate, regular rhythm and intact distal pulses.   Pulmonary/Chest: Effort normal and breath sounds normal.  Abdominal: Soft. She exhibits no distension. There is no tenderness. There is no guarding.  Musculoskeletal: She exhibits no edema and no tenderness.  Neurological: She is alert and oriented to person, place, and time. No cranial nerve deficit.  Skin: Skin is warm. No rash noted.  Psychiatric: She has a normal mood and affect.    ED Course  Procedures (including critical care time) Labs Review Labs Reviewed  POCT I-STAT, CHEM 8 - Abnormal; Notable for the following:    Glucose, Bld 102 (*)    All other components within normal limits  POCT I-STAT TROPONIN I   Imaging Review Dg Chest 2 View  05/14/2013   CLINICAL DATA:  Chest pain, tachypnea  EXAM: CHEST  2 VIEW  COMPARISON:  Prior radiograph from 09/29/2006  FINDINGS: The cardiac and mediastinal silhouettes are stable in size and contour, and remain within normal limits.  The lungs are normally inflated. No airspace consolidation, pleural effusion, or pulmonary edema is identified. There is no pneumothorax.  No acute osseous abnormality identified.  IMPRESSION: No active cardiopulmonary disease.   Electronically Signed   By: Rise Mu M.D.   On: 05/14/2013 06:17    EKG Interpretation    Date/Time:  Tuesday May 14 2013 02:19:17 EST Ventricular Rate:  80 PR Interval:  144 QRS  Duration: 83 QT Interval:  401 QTC Calculation: 463 R Axis:   54 Text Interpretation:  Sinus rhythm Confirmed by Tenya Araque  MD, Zerrick Hanssen ([id number]) on 05/14/2013 4:55:28 AM            MDM   1. Chest pressure    Well appearing. Clinically panic attack with multiple sxs. Low risk cardiac. Very low risk PE, PERC neg, no ddimer indicated. Screening ekg and troponin unremarkable. Close fup outpt.  Pain improved on recheck, well appearing.   Discussed outpt fup. And possible stress test if recurs.  .Results and differential diagnosis were discussed with the patient. Close follow up outpatient was discussed, patient comfortable with the plan.   Diagnosis: Chest pressure, anxiety   Enid Skeens, MD 05/14/13 [phone]

## 2013-05-14 NOTE — ED Notes (Signed)
Per EMS pt was at work when she started to feel weak, chest pressure, fatigue, numbness and tingling in her arms and legs, and began to hyperventilate. EMS was called and upon initial assessment pt's RR was 60 and HR was 120. Pt's HR and RR decreased upon transition to the department, HR 83, RR 22. EMS reports pt has a hx of anxiety but states that this feels different, pt reports her last anxiety attack was 10 years ago. Pt is A&O X4.

## 2013-05-20 ENCOUNTER — Other Ambulatory Visit (HOSPITAL_COMMUNITY): Payer: Self-pay | Admitting: Family Medicine

## 2013-05-20 DIAGNOSIS — E669 Obesity, unspecified: Secondary | ICD-10-CM

## 2013-05-20 DIAGNOSIS — R0789 Other chest pain: Secondary | ICD-10-CM

## 2013-05-22 ENCOUNTER — Ambulatory Visit (HOSPITAL_COMMUNITY)
Admission: RE | Admit: 2013-05-22 | Discharge: 2013-05-22 | Disposition: A | Discharge status: Home / Self Care | Payer: Medicaid Other | Source: Ambulatory Visit | Attending: Family Medicine | Admitting: Family Medicine

## 2013-05-22 DIAGNOSIS — R0789 Other chest pain: Secondary | ICD-10-CM

## 2013-05-22 DIAGNOSIS — R079 Chest pain, unspecified: Secondary | ICD-10-CM | POA: Insufficient documentation

## 2013-05-22 DIAGNOSIS — E669 Obesity, unspecified: Secondary | ICD-10-CM

## 2013-06-03 ENCOUNTER — Ambulatory Visit (HOSPITAL_COMMUNITY)
Admission: RE | Admit: 2013-06-03 | Discharge: 2013-06-03 | Disposition: A | Discharge status: Home / Self Care | Payer: Medicaid Other | Source: Ambulatory Visit | Attending: Cardiology | Admitting: Cardiology

## 2013-06-03 DIAGNOSIS — E119 Type 2 diabetes mellitus without complications: Secondary | ICD-10-CM | POA: Insufficient documentation

## 2013-06-03 DIAGNOSIS — R0789 Other chest pain: Secondary | ICD-10-CM

## 2013-06-03 DIAGNOSIS — R079 Chest pain, unspecified: Secondary | ICD-10-CM | POA: Insufficient documentation

## 2013-06-03 DIAGNOSIS — E669 Obesity, unspecified: Secondary | ICD-10-CM

## 2013-06-03 NOTE — Progress Notes (Signed)
2D Echo Performed 06/03/2013    Findley Vi, RCS  

## 2013-06-03 NOTE — Progress Notes (Deleted)
2D Echo Performed 06/03/2013    Jaxiel Kines, RCS  

## 2013-08-07 ENCOUNTER — Ambulatory Visit: Payer: Medicaid Other | Admitting: *Deleted

## 2013-08-19 ENCOUNTER — Encounter: Payer: Self-pay | Admitting: Dietician

## 2013-08-19 ENCOUNTER — Encounter: Payer: Medicaid Other | Attending: Family Medicine | Admitting: Dietician

## 2013-08-19 VITALS — Ht 64.0 in | Wt 208.8 lb

## 2013-08-19 DIAGNOSIS — Z6835 Body mass index (BMI) 35.0-35.9, adult: Secondary | ICD-10-CM | POA: Insufficient documentation

## 2013-08-19 DIAGNOSIS — E669 Obesity, unspecified: Secondary | ICD-10-CM

## 2013-08-19 DIAGNOSIS — E663 Overweight: Secondary | ICD-10-CM | POA: Insufficient documentation

## 2013-08-19 DIAGNOSIS — Z713 Dietary counseling and surveillance: Secondary | ICD-10-CM | POA: Insufficient documentation

## 2013-08-19 NOTE — Progress Notes (Signed)
  Medical Nutrition Therapy:  Appt start time: 1530 end time:  1630.   Assessment:  Primary concerns today: Renee Knapp is here today since she is trying to lose weight. Currently her doctor is watching her blood sugar since her mother has diabetes. Last Hgb A1c was 6.0% in October and will have it tested again soon. States that she cannot get below 200 lbs (since 2010). Has tried to lose weight by eating ground [place] instead of beef, eating more vegetables.    Lives with her two boys and will play basketball with them. 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 States her portions don't seem too big.   Preferred Learning Style:   No preference indicated   Learning Readiness:   Ready  MEDICATIONS: see list   DIETARY INTAKE:  24-hr recall:  B ( AM): egg and toast or muffin with applesauce or [place] bacon/sausage   Snk ( AM): [facility] Protein bar L ( PM): salad with kale and spinach, cucumber, tomatoes, cheese with [place] dressing  Snk ( PM): [employer] bar or [employer]  D ( PM): pork chop with mixed vegetables and potato Snk ( PM): celery and peanut butter or grapes, shake with vegetables and fruit Beverages: water or (8 oz juice)  Usual physical activity: walking at the gym 2 x week for 30 minutes  Estimated energy needs: 1800 calories 200 g carbohydrates 135 g protein 50 g fat  Progress Towards Goal(s):  In progress.   Nutritional Diagnosis:  Nichols-3.3 Overweight/obesity As related to hx of large portion sizes and physical inactivity.  As evidenced by BMI of 35.8.    Intervention:  Nutrition counseling provided. Plan: Aim to work up to 30 minutes of activity 5 x week. Portion out food on small plates and try to take 20 minutes to eat (chew 20-30 times). Have seconds if your stomach is hungry. Fill have of your plate with vegetables, limit starch to quarter of you plate. Try the 50 calorie cranberry juice and/or dilute your juice with selzer to cut back on some sugar.   Have some  protein with carbohydrates for snacks [facility]([employer] Protein), cheese stick and fruit or crackers, peanut butter, nuts.  Teaching Method Utilized:  Visual Auditory Hands on  Handouts given during visit include:  Living Well with Diabetes  MyPlate Handout  Yellow Card  15 g CHO Snacks  Barriers to learning/adherence to lifestyle change: none  Demonstrated degree of understanding via:  Teach Back   Monitoring/Evaluation:  Dietary intake, exercise, and body weight in 2 month(s).

## 2013-08-19 NOTE — Patient Instructions (Addendum)
Aim to work up to 30 minutes of activity 5 x week. Portion out food on small plates and try to take 20 minutes to eat (chew 20-30 times). Have seconds if your stomach is hungry. Fill have of your plate with vegetables, limit starch to quarter of you plate. Try the 50 calorie cranberry juice and/or dilute your juice with selzer to cut back on some sugar.   Have some protein with carbohydrates for snacks [facility]([employer] Protein), cheese stick and fruit or crackers, peanut butter, nuts.

## 2013-09-10 ENCOUNTER — Encounter (HOSPITAL_COMMUNITY): Payer: Self-pay | Admitting: Emergency Medicine

## 2013-09-10 ENCOUNTER — Emergency Department (HOSPITAL_COMMUNITY)
Admission: EM | Admit: 2013-09-10 | Discharge: 2013-09-10 | Disposition: A | Discharge status: Home / Self Care | Payer: Medicaid Other | Source: Home / Self Care | Attending: Emergency Medicine | Admitting: Emergency Medicine

## 2013-09-10 DIAGNOSIS — M109 Gout, unspecified: Secondary | ICD-10-CM

## 2013-09-10 LAB — URIC ACID: Uric Acid, Serum: 6.9 mg/dL (ref 2.4–7.0)

## 2013-09-10 MED ORDER — KETOROLAC TROMETHAMINE 60 MG/2ML IM SOLN
INTRAMUSCULAR | Status: AC
  Filled 2013-09-10: qty 2

## 2013-09-10 MED ORDER — KETOROLAC TROMETHAMINE 60 MG/2ML IM SOLN
60.0000 mg | Freq: Once | INTRAMUSCULAR | Status: AC
  Administered 2013-09-10: 60 mg via INTRAMUSCULAR

## 2013-09-10 MED ORDER — OXYCODONE-ACETAMINOPHEN 5-325 MG PO TABS: ORAL_TABLET | ORAL | Status: DC

## 2013-09-10 MED ORDER — COLCHICINE 0.6 MG PO TABS: ORAL_TABLET | ORAL | Status: DC

## 2013-09-10 NOTE — ED Provider Notes (Signed)
  Chief Complaint   Chief Complaint  Patient presents with  . Foot Pain    History of Present Illness   Renee Knapp is -year-old female who has had a five-day history of pain over the dorsum of the foot and in the MTP joint of the great toe. There is swelling, slight erythema, and heat. No history of trauma. She denies fever chills. No other joint pain. She herself has not had any history of gout, but a sister and father have both had gout.  Review of Systems   Other than as noted above, the patient denies any of the following symptoms: Systemic:  No fevers or chills. Musculoskeletal:  No joint pain or arthritis.  Neurological:  No muscular weakness, paresthesias.   PMFSH   Past medical history, family history, social history, meds, and allergies were reviewed.   She takes Prevacid. She has prediabetes.  Physical  Examination     Vital signs:  BP 148/94  Pulse 99  Temp(Src) 98.7 F (37.1 C) (Oral)  Resp 16  SpO2 100% Gen:  Alert and oriented times 3.  In no distress. Musculoskeletal:  Exam of the foot reveals slight swelling, erythema, and tenderness to palpation over the MTP joint of the great toe to the dorsum of the foot. Pulses were full, sensation and capillary refill are normal. Joint survey was unremarkable.  Otherwise, all joints had a full a ROM with no swelling, bruising or deformity.  No edema, pulses full. Extremities were warm and pink.  Capillary refill was brisk.  Skin:  Clear, warm and dry.  No rash. Neuro:  Alert and oriented times 3.  Muscle strength was normal.  Sensation was intact to light touch.     Labs   Results for orders placed during the hospital encounter of 09/10/13  URIC ACID      Result Value Ref Range   Uric Acid, Serum 6.9  2.4 - 7.0 mg/dL   Course in Urgent Care Center   Given Toradol 60 mg IM.  Assessment   The encounter diagnosis was Gout attack.  Even though her uric acid level was high normal, I still think she has gout,  based upon her clinical presentation.  Plan    1.  Meds:  The following meds were prescribed:   Discharge Medication List as of 09/10/2013  5:50 PM    START taking these medications   Details  colchicine 0.6 MG tablet Take 2 now and 1 in 1 hour.  May repeat dose once daily.  For gout attack., Normal    oxyCODONE-acetaminophen (PERCOCET) 5-325 MG per tablet 1 to 2 tablets every 6 hours as needed for pain., Print        2.  Patient Education/Counseling:  The patient was given appropriate handouts, self care instructions, and instructed in symptomatic relief including rest and activity, elevation, application of ice and compression.    3.  Follow up:  The patient was told to follow up here if no better in 3 to 4 days, or sooner if becoming worse in any way, and given some red flag symptoms such as worsening pain or neurological symptoms which would prompt immediate return.  Follow up  with her primary care physician next week.       Reuben Likesavid C Dametria Tuzzolino, MD 09/10/13 [phone]

## 2013-09-10 NOTE — Discharge Instructions (Signed)
Dietary treatment plays a supplementary role in treatment of gout.  Diet can be expected to decrease the uric acid level by 10 to 15%.  Often medication can effect a much more substantial reduction.  An extremely restrictive diet is not necessary, but here are a few suggestions that might help decrease the frequency of gout attacks. ° °The first and most important measure is to achieve and maintain a healthy body weight (BMI of 20 to 25).  It's been shown that people with a BMI over 25 have an increased risk of gout attacks compared to people with a BMI of less than 25.  Also, gout patients who lose as little as 4.5 kg or 9.9 lbs will decrease their risk of gout attacks. ° °Beyond that, here are a few general guidelines: ° °Eat less: °Red meat °Seafood °Beer and hard alcohol (e.g. gin, vodka, whiskey) °Foods that contain high fructose corn syrup (found in sweets and non-diet sodas) °Organ meats (liver, kidneys, brains, sweetbreads) or foods made from these meats (hot dogs, bologna). ° °Eat more: °Low fat dairy products °A moderate amount of wine (up to two 5 oz servings per day) is not likely to increase the risk of a gout attack. °Coffee may decrease the risk of gout attacks °Vitamin C (500 mg per day) has a mild urate lowering effect ° ° ° °Gout °Gout is an inflammatory arthritis caused by a buildup of uric acid crystals in the joints. Uric acid is a chemical that is normally present in the blood. When the level of uric acid in the blood is too high it can form crystals that deposit in your joints and tissues. This causes joint redness, soreness, and swelling (inflammation). Repeat attacks are common. Over time, uric acid crystals can form into masses (tophi) near a joint, destroying bone and causing disfigurement. Gout is treatable and often preventable. °CAUSES  °The disease begins with elevated levels of uric acid in the blood. Uric acid is produced by your body when it breaks down a naturally found substance  called purines. Certain foods you eat, such as meats and fish, contain high amounts of purines. Causes of an elevated uric acid level include: °· Being passed down from parent to child (heredity). °· Diseases that cause increased uric acid production (such as obesity, psoriasis, and certain cancers). °· Excessive alcohol use. °· Diet, especially diets rich in meat and seafood. °· Medicines, including certain cancer-fighting medicines (chemotherapy), water pills (diuretics), and aspirin. °· Chronic kidney disease. The kidneys are no longer able to remove uric acid well. °· Problems with metabolism. °Conditions strongly associated with gout include: °· Obesity. °· High blood pressure. °· High cholesterol. °· Diabetes. °Not everyone with elevated uric acid levels gets gout. It is not understood why some people get gout and others do not. Surgery, joint injury, and eating too much of certain foods are some of the factors that can lead to gout attacks. °SYMPTOMS  °· An attack of gout comes on quickly. It causes intense pain with redness, swelling, and warmth in a joint. °· Fever can occur. °· Often, only one joint is involved. Certain joints are more commonly involved: °· Base of the big toe. °· Knee. °· Ankle. °· Wrist. °· Finger. °Without treatment, an attack usually goes away in a few days to weeks. Between attacks, you usually will not have symptoms, which is different from many other forms of arthritis. °DIAGNOSIS  °Your caregiver will suspect gout based on your symptoms and exam. In   some cases, tests may be recommended. The tests may include: °· Blood tests. °· Urine tests. °· X-rays. °· Joint fluid exam. This exam requires a needle to remove fluid from the joint (arthrocentesis). Using a microscope, gout is confirmed when uric acid crystals are seen in the joint fluid. °TREATMENT  °There are two phases to gout treatment: treating the sudden onset (acute) attack and preventing attacks (prophylaxis). °· Treatment of  an Acute Attack. °· Medicines are used. These include anti-inflammatory medicines or steroid medicines. °· An injection of steroid medicine into the affected joint is sometimes necessary. °· The painful joint is rested. Movement can worsen the arthritis. °· You may use warm or cold treatments on painful joints, depending which works best for you. °· Treatment to Prevent Attacks. °· If you suffer from frequent gout attacks, your caregiver may advise preventive medicine. These medicines are started after the acute attack subsides. These medicines either help your kidneys eliminate uric acid from your body or decrease your uric acid production. You may need to stay on these medicines for a very long time. °· The early phase of treatment with preventive medicine can be associated with an increase in acute gout attacks. For this reason, during the first few months of treatment, your caregiver may also advise you to take medicines usually used for acute gout treatment. Be sure you understand your caregiver's directions. Your caregiver may make several adjustments to your medicine dose before these medicines are effective. °· Discuss dietary treatment with your caregiver or dietitian. Alcohol and drinks high in sugar and fructose and foods such as meat, poultry, and seafood can increase uric acid levels. Your caregiver or dietician can advise you on drinks and foods that should be limited. °HOME CARE INSTRUCTIONS  °· Do not take aspirin to relieve pain. This raises uric acid levels. °· Only take over-the-counter or prescription medicines for pain, discomfort, or fever as directed by your caregiver. °· Rest the joint as much as possible. When in bed, keep sheets and blankets off painful areas. °· Keep the affected joint raised (elevated). °· Apply warm or cold treatments to painful joints. Use of warm or cold treatments depends on which works best for you. °· Use crutches if the painful joint is in your leg. °· Drink enough  fluids to keep your urine clear or pale yellow. This helps your body get rid of uric acid. Limit alcohol, sugary drinks, and fructose drinks. °· Follow your dietary instructions. Pay careful attention to the amount of protein you eat. Your daily diet should emphasize fruits, vegetables, whole grains, and fat-free or low-fat milk products. Discuss the use of coffee, vitamin C, and cherries with your caregiver or dietician. These may be helpful in lowering uric acid levels. °· Maintain a healthy body weight. °SEEK MEDICAL CARE IF:  °· You develop diarrhea, vomiting, or any side effects from medicines. °· You do not feel better in 24 hours, or you are getting worse. °SEEK IMMEDIATE MEDICAL CARE IF:  °· Your joint becomes suddenly more tender, and you have chills or a fever. °MAKE SURE YOU:  °· Understand these instructions. °· Will watch your condition. °· Will get help right away if you are not doing well or get worse. °Document Released: 05/06/2000 Document Revised: 09/03/2012 Document Reviewed: 12/21/2011 °[employer]® Patient Information ©2014 [employer]. ° °

## 2013-09-10 NOTE — ED Notes (Signed)
Foot pain and swelling, no injury

## 2013-09-11 DIAGNOSIS — M79673 Pain in unspecified foot: Secondary | ICD-10-CM | POA: Insufficient documentation

## 2013-09-15 ENCOUNTER — Telehealth (HOSPITAL_COMMUNITY): Payer: Self-pay | Admitting: *Deleted

## 2013-09-15 NOTE — ED Notes (Addendum)
Uric Acid 6.9. Dr. Lorenz CoasterKeller tried to call pt. to f/u with her PCP.  He asked me to send a letter.  I left a message for pt. to call. Renee Knapp 09/15/2013 Pt. called back and said she has f/u with Dr. Anna Gen[facility]phanie Powers at [facility].  Her doctor said she could see her results and is referring her to a foot specialist. Dr. Lorenz CoasterKeller notified. 09/15/2013

## 2013-09-18 ENCOUNTER — Ambulatory Visit: Payer: Medicaid Other | Admitting: Dietician

## 2013-10-21 ENCOUNTER — Ambulatory Visit: Payer: Medicaid Other | Admitting: Dietician

## 2013-10-28 DIAGNOSIS — N390 Urinary tract infection, site not specified: Secondary | ICD-10-CM | POA: Insufficient documentation

## 2013-11-01 DIAGNOSIS — A549 Gonococcal infection, unspecified: Secondary | ICD-10-CM | POA: Insufficient documentation

## 2013-11-01 DIAGNOSIS — A599 Trichomoniasis, unspecified: Secondary | ICD-10-CM | POA: Insufficient documentation

## 2013-12-20 ENCOUNTER — Other Ambulatory Visit: Payer: Self-pay | Admitting: Obstetrics and Gynecology

## 2013-12-27 ENCOUNTER — Other Ambulatory Visit: Payer: Self-pay | Admitting: Obstetrics and Gynecology

## 2013-12-27 DIAGNOSIS — R928 Other abnormal and inconclusive findings on diagnostic imaging of breast: Secondary | ICD-10-CM

## 2014-01-02 ENCOUNTER — Other Ambulatory Visit: Payer: Medicaid Other

## 2014-01-03 ENCOUNTER — Ambulatory Visit
Admission: RE | Admit: 2014-01-03 | Discharge: 2014-01-03 | Disposition: A | Discharge status: Home / Self Care | Payer: Medicaid Other | Source: Ambulatory Visit | Attending: Obstetrics and Gynecology | Admitting: Obstetrics and Gynecology

## 2014-01-03 DIAGNOSIS — R928 Other abnormal and inconclusive findings on diagnostic imaging of breast: Secondary | ICD-10-CM

## 2014-01-10 ENCOUNTER — Other Ambulatory Visit: Payer: Self-pay | Admitting: Obstetrics and Gynecology

## 2014-01-10 DIAGNOSIS — G8929 Other chronic pain: Secondary | ICD-10-CM

## 2014-01-10 DIAGNOSIS — R1084 Generalized abdominal pain: Principal | ICD-10-CM

## 2014-01-15 ENCOUNTER — Ambulatory Visit
Admission: RE | Admit: 2014-01-15 | Discharge: 2014-01-15 | Disposition: A | Discharge status: Home / Self Care | Payer: Medicaid Other | Source: Ambulatory Visit | Attending: Obstetrics and Gynecology | Admitting: Obstetrics and Gynecology

## 2014-01-15 DIAGNOSIS — R1084 Generalized abdominal pain: Principal | ICD-10-CM

## 2014-01-15 DIAGNOSIS — G8929 Other chronic pain: Secondary | ICD-10-CM

## 2014-01-15 MED ORDER — IOHEXOL 300 MG/ML  SOLN
100.0000 mL | Freq: Once | INTRAMUSCULAR | Status: AC | PRN
  Administered 2014-01-15: 100 mL via INTRAVENOUS

## 2014-02-11 ENCOUNTER — Encounter (HOSPITAL_COMMUNITY): Payer: Self-pay

## 2014-02-17 ENCOUNTER — Encounter (HOSPITAL_COMMUNITY): Payer: Self-pay

## 2014-02-17 ENCOUNTER — Encounter (HOSPITAL_COMMUNITY)
Admission: RE | Admit: 2014-02-17 | Discharge: 2014-02-17 | Disposition: A | Discharge status: Home / Self Care | Payer: Medicaid Other | Source: Ambulatory Visit | Attending: Obstetrics & Gynecology | Admitting: Obstetrics & Gynecology

## 2014-02-17 DIAGNOSIS — N949 Unspecified condition associated with female genital organs and menstrual cycle: Secondary | ICD-10-CM | POA: Diagnosis not present

## 2014-02-17 DIAGNOSIS — Z01812 Encounter for preprocedural laboratory examination: Secondary | ICD-10-CM | POA: Insufficient documentation

## 2014-02-17 HISTORY — DX: Anemia, unspecified: D64.9

## 2014-02-17 LAB — BASIC METABOLIC PANEL
Anion gap: 14 (ref 5–15)
BUN: 12 mg/dL (ref 6–23)
CHLORIDE: 100 meq/L (ref 96–112)
CO2: 26 mEq/L (ref 19–32)
CREATININE: 0.81 mg/dL (ref 0.50–1.10)
Calcium: 9.7 mg/dL (ref 8.4–10.5)
GFR calc Af Amer: >90 mL/min (ref >90)
GFR calc non Af Amer: 90 mL/min — ABNORMAL LOW (ref >90)
Glucose, Bld: 120 mg/dL — ABNORMAL HIGH (ref 70–99)
Potassium: 4.5 mEq/L (ref 3.7–5.3)
SODIUM: 140 meq/L (ref 137–147)

## 2014-02-17 LAB — CBC
HCT: 38.1 % (ref 36.0–46.0)
Hemoglobin: 12.6 g/dL (ref 12.0–15.0)
MCH: 26.7 pg (ref 26.0–34.0)
MCHC: 33.1 g/dL (ref 30.0–36.0)
MCV: 80.7 fL (ref 78.0–100.0)
PLATELETS: 351 10*3/uL (ref 150–400)
RBC: 4.72 MIL/uL (ref 3.87–5.11)
RDW: 14 % (ref 11.5–15.5)
WBC: 7.2 10*3/uL (ref 4.0–10.5)

## 2014-02-17 NOTE — Patient Instructions (Addendum)
   Your procedure is scheduled on:  Monday, Oct 5  Enter through the Main Entrance of [facility] at:  1045 AM Pick up the phone at the desk and dial [phone] and inform us of your arrival.  Please call this number if you have any problems the morning of surgery: [phone]  Remember: Do not eat food after midnight:Sunday Do not drink clear liquids after: 8 AM Monday, day of surgery Take these medicines the morning of surgery with a SIP OF WATER:  None  Do not wear jewelry, make-up, or FINGER nail polish No metal in your hair or on your body. Do not wear lotions, powders, perfumes.  You may wear deodorant.  Do not bring valuables to the hospital. Contacts, dentures or bridgework may not be worn into surgery.  Leave suitcase in the car. After Surgery it may be brought to your room. For patients being admitted to the hospital, checkout time is 11:00am the day of discharge.  Home with -to be  arranged prior to surgery.

## 2014-02-24 ENCOUNTER — Encounter (HOSPITAL_COMMUNITY): Payer: Medicaid Other | Admitting: Certified Registered Nurse Anesthetist

## 2014-02-24 ENCOUNTER — Encounter (HOSPITAL_COMMUNITY)
Admission: RE | Disposition: A | Discharge status: Home / Self Care | Payer: Self-pay | Source: Ambulatory Visit | Attending: Obstetrics & Gynecology

## 2014-02-24 ENCOUNTER — Ambulatory Visit (HOSPITAL_COMMUNITY): Payer: Medicaid Other | Admitting: Certified Registered Nurse Anesthetist

## 2014-02-24 ENCOUNTER — Inpatient Hospital Stay (HOSPITAL_COMMUNITY)
Admission: RE | Admit: 2014-02-24 | Discharge: 2014-02-25 | DRG: 742 | Disposition: A | Discharge status: Home / Self Care | Payer: Medicaid Other | Source: Ambulatory Visit | Attending: Obstetrics & Gynecology | Admitting: Obstetrics & Gynecology

## 2014-02-24 DIAGNOSIS — E119 Type 2 diabetes mellitus without complications: Secondary | ICD-10-CM | POA: Diagnosis present

## 2014-02-24 DIAGNOSIS — G8929 Other chronic pain: Secondary | ICD-10-CM | POA: Diagnosis present

## 2014-02-24 DIAGNOSIS — Z833 Family history of diabetes mellitus: Secondary | ICD-10-CM

## 2014-02-24 DIAGNOSIS — N9971 Accidental puncture and laceration of a genitourinary system organ or structure during a genitourinary system procedure: Secondary | ICD-10-CM | POA: Diagnosis present

## 2014-02-24 DIAGNOSIS — N946 Dysmenorrhea, unspecified: Secondary | ICD-10-CM | POA: Diagnosis present

## 2014-02-24 DIAGNOSIS — N949 Unspecified condition associated with female genital organs and menstrual cycle: Secondary | ICD-10-CM | POA: Diagnosis present

## 2014-02-24 DIAGNOSIS — D649 Anemia, unspecified: Secondary | ICD-10-CM | POA: Diagnosis present

## 2014-02-24 DIAGNOSIS — Z6835 Body mass index (BMI) 35.0-35.9, adult: Secondary | ICD-10-CM

## 2014-02-24 DIAGNOSIS — Z90710 Acquired absence of both cervix and uterus: Secondary | ICD-10-CM | POA: Diagnosis present

## 2014-02-24 HISTORY — PX: CYSTOSCOPY: SHX5120

## 2014-02-24 HISTORY — PX: LAPAROSCOPIC ASSISTED VAGINAL HYSTERECTOMY: SHX5398

## 2014-02-24 LAB — GLUCOSE, CAPILLARY
Glucose-Capillary: 100 mg/dL — ABNORMAL HIGH (ref 70–99)
Glucose-Capillary: 149 mg/dL — ABNORMAL HIGH (ref 70–99)

## 2014-02-24 LAB — PREGNANCY, URINE: Preg Test, Ur: NEGATIVE

## 2014-02-24 SURGERY — HYSTERECTOMY, VAGINAL, LAPAROSCOPY-ASSISTED
Anesthesia: General | Site: Bladder

## 2014-02-24 MED ORDER — DEXAMETHASONE SODIUM PHOSPHATE 10 MG/ML IJ SOLN
INTRAMUSCULAR | Status: DC | PRN
  Administered 2014-02-24: 5 mg via INTRAVENOUS

## 2014-02-24 MED ORDER — KETOROLAC TROMETHAMINE 30 MG/ML IJ SOLN: 15.0000 mg | Freq: Once | INTRAMUSCULAR | Status: DC | PRN

## 2014-02-24 MED ORDER — MIDAZOLAM HCL 2 MG/2ML IJ SOLN
INTRAMUSCULAR | Status: DC | PRN
  Administered 2014-02-24: 1.5 mg via INTRAVENOUS
  Administered 2014-02-24: 0.5 mg via INTRAVENOUS

## 2014-02-24 MED ORDER — MENTHOL 3 MG MT LOZG: 1.0000 | LOZENGE | OROMUCOSAL | Status: DC | PRN

## 2014-02-24 MED ORDER — HYDROMORPHONE HCL 1 MG/ML IJ SOLN
INTRAMUSCULAR | Status: AC
  Filled 2014-02-24: qty 1

## 2014-02-24 MED ORDER — FENTANYL CITRATE 0.05 MG/ML IJ SOLN
INTRAMUSCULAR | Status: DC | PRN
  Administered 2014-02-24 (×2): 100 ug via INTRAVENOUS
  Administered 2014-02-24: 50 ug via INTRAVENOUS
  Administered 2014-02-24 (×2): 100 ug via INTRAVENOUS
  Administered 2014-02-24: 50 ug via INTRAVENOUS

## 2014-02-24 MED ORDER — LACTATED RINGERS IV SOLN
INTRAVENOUS | Status: DC
  Administered 2014-02-24 (×2): via INTRAVENOUS

## 2014-02-24 MED ORDER — ZOLPIDEM TARTRATE 5 MG PO TABS: 5.0000 mg | ORAL_TABLET | Freq: Every evening | ORAL | Status: DC | PRN

## 2014-02-24 MED ORDER — FENTANYL CITRATE 0.05 MG/ML IJ SOLN
INTRAMUSCULAR | Status: AC
  Filled 2014-02-24: qty 5

## 2014-02-24 MED ORDER — SIMETHICONE 80 MG PO CHEW
80.0000 mg | CHEWABLE_TABLET | Freq: Four times a day (QID) | ORAL | Status: DC
  Administered 2014-02-24 – 2014-02-25 (×2): 80 mg via ORAL
  Filled 2014-02-24 (×2): qty 1

## 2014-02-24 MED ORDER — SCOPOLAMINE 1 MG/3DAYS TD PT72
1.0000 | MEDICATED_PATCH | Freq: Once | TRANSDERMAL | Status: DC
  Administered 2014-02-24: 1.5 mg via TRANSDERMAL

## 2014-02-24 MED ORDER — HYDROMORPHONE HCL 1 MG/ML IJ SOLN: 0.2000 mg | INTRAMUSCULAR | Status: DC | PRN

## 2014-02-24 MED ORDER — PNEUMOCOCCAL VAC POLYVALENT 25 MCG/0.5ML IJ INJ
0.5000 mL | INJECTION | INTRAMUSCULAR | Status: DC
  Filled 2014-02-24: qty 0.5

## 2014-02-24 MED ORDER — ESTRADIOL 0.1 MG/GM VA CREA
TOPICAL_CREAM | VAGINAL | Status: DC | PRN
  Administered 2014-02-24: 1 via VAGINAL

## 2014-02-24 MED ORDER — LIDOCAINE HCL (CARDIAC) 20 MG/ML IV SOLN
INTRAVENOUS | Status: DC | PRN
  Administered 2014-02-24: 80 mg via INTRAVENOUS

## 2014-02-24 MED ORDER — ROCURONIUM BROMIDE 100 MG/10ML IV SOLN
INTRAVENOUS | Status: DC | PRN
  Administered 2014-02-24: 50 mg via INTRAVENOUS
  Administered 2014-02-24 (×3): 10 mg via INTRAVENOUS

## 2014-02-24 MED ORDER — LIDOCAINE-EPINEPHRINE 1 %-1:100000 IJ SOLN
INTRAMUSCULAR | Status: AC
  Filled 2014-02-24: qty 1

## 2014-02-24 MED ORDER — NEOSTIGMINE METHYLSULFATE 10 MG/10ML IV SOLN
INTRAVENOUS | Status: DC | PRN
  Administered 2014-02-24: 3 mg via INTRAVENOUS

## 2014-02-24 MED ORDER — MIDAZOLAM HCL 2 MG/2ML IJ SOLN
INTRAMUSCULAR | Status: AC
  Filled 2014-02-24: qty 2

## 2014-02-24 MED ORDER — ROCURONIUM BROMIDE 100 MG/10ML IV SOLN
INTRAVENOUS | Status: AC
  Filled 2014-02-24: qty 1

## 2014-02-24 MED ORDER — KETOROLAC TROMETHAMINE 30 MG/ML IJ SOLN
30.0000 mg | Freq: Four times a day (QID) | INTRAMUSCULAR | Status: DC
  Administered 2014-02-24 – 2014-02-25 (×2): 30 mg via INTRAVENOUS
  Filled 2014-02-24 (×2): qty 1

## 2014-02-24 MED ORDER — METHYLENE BLUE 1 % INJ SOLN
INTRAMUSCULAR | Status: AC
  Filled 2014-02-24: qty 1

## 2014-02-24 MED ORDER — CEFAZOLIN SODIUM-DEXTROSE 2-3 GM-% IV SOLR
INTRAVENOUS | Status: AC
  Filled 2014-02-24: qty 50

## 2014-02-24 MED ORDER — ONDANSETRON HCL 4 MG/2ML IJ SOLN
INTRAMUSCULAR | Status: DC | PRN
  Administered 2014-02-24 (×2): 2 mg via INTRAVENOUS

## 2014-02-24 MED ORDER — PROPOFOL 10 MG/ML IV BOLUS
INTRAVENOUS | Status: DC | PRN
  Administered 2014-02-24: 150 mg via INTRAVENOUS

## 2014-02-24 MED ORDER — ONDANSETRON HCL 4 MG/2ML IJ SOLN
INTRAMUSCULAR | Status: AC
  Filled 2014-02-24: qty 2

## 2014-02-24 MED ORDER — ONDANSETRON HCL 4 MG PO TABS: 4.0000 mg | ORAL_TABLET | Freq: Four times a day (QID) | ORAL | Status: DC | PRN

## 2014-02-24 MED ORDER — KETOROLAC TROMETHAMINE 30 MG/ML IJ SOLN: 30.0000 mg | Freq: Four times a day (QID) | INTRAMUSCULAR | Status: DC

## 2014-02-24 MED ORDER — STERILE WATER FOR IRRIGATION IR SOLN
Status: DC | PRN
  Administered 2014-02-24: 1000 mL via INTRAVESICAL

## 2014-02-24 MED ORDER — HYDROMORPHONE HCL 1 MG/ML IJ SOLN
INTRAMUSCULAR | Status: DC | PRN
  Administered 2014-02-24: 2 mg via INTRAVENOUS

## 2014-02-24 MED ORDER — PROPOFOL 10 MG/ML IV EMUL
INTRAVENOUS | Status: AC
  Filled 2014-02-24: qty 20

## 2014-02-24 MED ORDER — OXYCODONE-ACETAMINOPHEN 5-325 MG PO TABS
1.0000 | ORAL_TABLET | ORAL | Status: DC | PRN
  Administered 2014-02-24 – 2014-02-25 (×2): 1 via ORAL
  Filled 2014-02-24 (×2): qty 1

## 2014-02-24 MED ORDER — ESTRADIOL 0.1 MG/GM VA CREA
TOPICAL_CREAM | VAGINAL | Status: AC
  Filled 2014-02-24: qty 42.5

## 2014-02-24 MED ORDER — LACTATED RINGERS IV SOLN
INTRAVENOUS | Status: DC
  Administered 2014-02-24: 18:00:00 via INTRAVENOUS
  Administered 2014-02-25: 100 mL/h via INTRAVENOUS

## 2014-02-24 MED ORDER — HYDROMORPHONE HCL 1 MG/ML IJ SOLN
INTRAMUSCULAR | Status: AC
  Administered 2014-02-24: 0.5 mg via INTRAVENOUS
  Filled 2014-02-24: qty 1

## 2014-02-24 MED ORDER — LIDOCAINE HCL (CARDIAC) 20 MG/ML IV SOLN
INTRAVENOUS | Status: AC
  Filled 2014-02-24: qty 5

## 2014-02-24 MED ORDER — SCOPOLAMINE 1 MG/3DAYS TD PT72
MEDICATED_PATCH | TRANSDERMAL | Status: AC
  Administered 2014-02-24: 1.5 mg via TRANSDERMAL
  Filled 2014-02-24: qty 1

## 2014-02-24 MED ORDER — SODIUM CHLORIDE 0.9 % IJ SOLN
INTRAMUSCULAR | Status: AC
  Filled 2014-02-24: qty 10

## 2014-02-24 MED ORDER — GLYCOPYRROLATE 0.2 MG/ML IJ SOLN
INTRAMUSCULAR | Status: AC
  Filled 2014-02-24: qty 3

## 2014-02-24 MED ORDER — MEPERIDINE HCL 25 MG/ML IJ SOLN: 6.2500 mg | INTRAMUSCULAR | Status: DC | PRN

## 2014-02-24 MED ORDER — HYDROMORPHONE HCL 1 MG/ML IJ SOLN
0.2500 mg | INTRAMUSCULAR | Status: DC | PRN
  Administered 2014-02-24 (×3): 0.5 mg via INTRAVENOUS

## 2014-02-24 MED ORDER — BUPIVACAINE HCL (PF) 0.25 % IJ SOLN
INTRAMUSCULAR | Status: AC
  Filled 2014-02-24: qty 30

## 2014-02-24 MED ORDER — CEFAZOLIN SODIUM-DEXTROSE 2-3 GM-% IV SOLR
2.0000 g | INTRAVENOUS | Status: AC
  Administered 2014-02-24: 2 g via INTRAVENOUS

## 2014-02-24 MED ORDER — LACTATED RINGERS IR SOLN
Status: DC | PRN
  Administered 2014-02-24: 3000 mL

## 2014-02-24 MED ORDER — ONDANSETRON HCL 4 MG/2ML IJ SOLN: 4.0000 mg | Freq: Four times a day (QID) | INTRAMUSCULAR | Status: DC | PRN

## 2014-02-24 MED ORDER — GLYCOPYRROLATE 0.2 MG/ML IJ SOLN
INTRAMUSCULAR | Status: DC | PRN
  Administered 2014-02-24: 0.1 mg via INTRAVENOUS
  Administered 2014-02-24: 0.4 mg via INTRAVENOUS

## 2014-02-24 MED ORDER — PANTOPRAZOLE SODIUM 40 MG IV SOLR
40.0000 mg | Freq: Every day | INTRAVENOUS | Status: DC
  Administered 2014-02-24: 40 mg via INTRAVENOUS
  Filled 2014-02-24 (×2): qty 40

## 2014-02-24 MED ORDER — NEOSTIGMINE METHYLSULFATE 10 MG/10ML IV SOLN
INTRAVENOUS | Status: AC
  Filled 2014-02-24: qty 1

## 2014-02-24 MED ORDER — KETOROLAC TROMETHAMINE 30 MG/ML IJ SOLN
INTRAMUSCULAR | Status: AC
  Filled 2014-02-24: qty 1

## 2014-02-24 MED ORDER — DEXAMETHASONE SODIUM PHOSPHATE 10 MG/ML IJ SOLN
INTRAMUSCULAR | Status: AC
  Filled 2014-02-24: qty 1

## 2014-02-24 MED ORDER — MIDAZOLAM HCL 2 MG/2ML IJ SOLN: 0.5000 mg | Freq: Once | INTRAMUSCULAR | Status: DC | PRN

## 2014-02-24 MED ORDER — PROMETHAZINE HCL 25 MG/ML IJ SOLN: 6.2500 mg | INTRAMUSCULAR | Status: DC | PRN

## 2014-02-24 MED ORDER — LACTATED RINGERS IV SOLN: INTRAVENOUS | Status: DC

## 2014-02-24 MED ORDER — LIDOCAINE-EPINEPHRINE 1 %-1:100000 IJ SOLN
INTRAMUSCULAR | Status: DC | PRN
  Administered 2014-02-24: 13 mL

## 2014-02-24 SURGICAL SUPPLY — 51 items
ADH SKN CLS APL DERMABOND .7 (GAUZE/BANDAGES/DRESSINGS) ×2
BLADE SURG 10 STRL SS (BLADE) ×4 IMPLANT
BLADE SURG 11 STRL SS (BLADE) ×8 IMPLANT
CLOTH BEACON ORANGE TIMEOUT ST (SAFETY) ×4 IMPLANT
COVER LIGHT HANDLE  1/PK (MISCELLANEOUS) ×2
COVER LIGHT HANDLE 1/PK (MISCELLANEOUS) ×2 IMPLANT
COVER TABLE BACK 60X90 (DRAPES) ×4 IMPLANT
DECANTER SPIKE VIAL GLASS SM (MISCELLANEOUS) ×2 IMPLANT
DERMABOND ADVANCED (GAUZE/BANDAGES/DRESSINGS) ×2
DERMABOND ADVANCED .7 DNX12 (GAUZE/BANDAGES/DRESSINGS) IMPLANT
DRESSING OPSITE X SMALL 2X3 (GAUZE/BANDAGES/DRESSINGS) ×2 IMPLANT
DRSG COVADERM PLUS 2X2 (GAUZE/BANDAGES/DRESSINGS) ×8 IMPLANT
DRSG OPSITE POSTOP 3X4 (GAUZE/BANDAGES/DRESSINGS) ×4 IMPLANT
DURAPREP 26ML APPLICATOR (WOUND CARE) ×4 IMPLANT
ELECT REM PT RETURN 9FT ADLT (ELECTROSURGICAL) ×4
ELECTRODE REM PT RTRN 9FT ADLT (ELECTROSURGICAL) ×2 IMPLANT
EVACUATOR SMOKE 8.L (FILTER) ×4 IMPLANT
GAUZE PACKING 1 X5 YD ST (GAUZE/BANDAGES/DRESSINGS) ×2 IMPLANT
GLOVE BIO SURGEON STRL SZ 6.5 (GLOVE) ×6 IMPLANT
GLOVE BIO SURGEON STRL SZ7 (GLOVE) ×8 IMPLANT
GLOVE BIO SURGEONS STRL SZ 6.5 (GLOVE) ×2
GLOVE ECLIPSE 7.5 STRL STRAW (GLOVE) ×4 IMPLANT
GLOVE INDICATOR 7.5 STRL GRN (GLOVE) ×4 IMPLANT
GOWN STRL REUS W/ TWL LRG LVL3 (GOWN DISPOSABLE) ×14 IMPLANT
GOWN STRL REUS W/TWL LRG LVL3 (GOWN DISPOSABLE) ×28
LIGASURE 5MM LAPAROSCOPIC (INSTRUMENTS) ×2 IMPLANT
LIGASURE LAP ATLAS 10MM 37CM (INSTRUMENTS) ×2 IMPLANT
NS IRRIG 1000ML POUR BTL (IV SOLUTION) ×4 IMPLANT
PACK LAVH (CUSTOM PROCEDURE TRAY) ×4 IMPLANT
PROTECTOR NERVE ULNAR (MISCELLANEOUS) ×4 IMPLANT
SET CYSTO W/LG BORE CLAMP LF (SET/KITS/TRAYS/PACK) ×6 IMPLANT
SET IRRIG TUBING LAPAROSCOPIC (IRRIGATION / IRRIGATOR) ×2 IMPLANT
SLEEVE XCEL OPT CAN 5 100 (ENDOMECHANICALS) ×4 IMPLANT
SUT MON AB 2-0 CT2 27 (SUTURE) ×4 IMPLANT
SUT MON AB 4-0 PS1 27 (SUTURE) ×4 IMPLANT
SUT VIC AB 0 CT1 18XCR BRD8 (SUTURE) ×4 IMPLANT
SUT VIC AB 0 CT1 27 (SUTURE) ×24
SUT VIC AB 0 CT1 27XBRD ANBCTR (SUTURE) ×4 IMPLANT
SUT VIC AB 0 CT1 27XCR 8 STRN (SUTURE) IMPLANT
SUT VIC AB 0 CT1 8-18 (SUTURE) ×8
SUT VIC AB 2-0 CT1 (SUTURE) ×8 IMPLANT
SUT VIC AB 2-0 UR6 27 (SUTURE) ×2 IMPLANT
SUT VICRYL 0 TIES 12 18 (SUTURE) ×4 IMPLANT
SUT VICRYL 0 UR6 27IN ABS (SUTURE) ×8 IMPLANT
SYR BULB IRRIGATION 50ML (SYRINGE) ×4 IMPLANT
TOWEL OR 17X24 6PK STRL BLUE (TOWEL DISPOSABLE) ×8 IMPLANT
TRAY FOLEY CATH 14FR (SET/KITS/TRAYS/PACK) ×6 IMPLANT
TROCAR OPTI TIP 5M 100M (ENDOMECHANICALS) ×4 IMPLANT
TROCAR XCEL DIL TIP R 11M (ENDOMECHANICALS) ×4 IMPLANT
WARMER LAPAROSCOPE (MISCELLANEOUS) ×4 IMPLANT
WATER STERILE IRR 1000ML POUR (IV SOLUTION) ×4 IMPLANT

## 2014-02-24 NOTE — H&P (Signed)
Renee Knapp is an 38 y.o. female presents for scheduled  LAVH. Patient notes that since menarche at age 89, she has always had very painful menses. She notes it has always been debilitating. For the past year the pain has become constant and is not cyclical, worse on her right side. She recently had a CT scan of the abdomen and pelvis that was negative. She denies abnormal uterine bleeding however she notes first few days of her period are heavy. She is not sexually active but when she was it was not painful, she denies dyschezia.  She had a tubal ligation in 2005 and is unsure if she was told she had endometriosis.    Pertinent Gynecological History: Menses: flow is excessive with use of 4 pads or tampons on heaviest days Bleeding: dysfunctional uterine bleeding Contraception: tubal ligation DES exposure: denies Blood transfusions: none Sexually transmitted diseases: no past history Previous GYN Procedures: DNC  Last mammogram: none Last pap: normal Date: 02/2013 OB History: G3, P2   Menstrual History: Menarche age: 56  Patient's last menstrual period was 02/06/2014.    Past Medical History  Diagnosis Date  . Plantar fasciitis, bilateral     History - recvd cortisone injections bilateral- no current prob as 01/2014  . Obesity   . SVD (spontaneous vaginal delivery)     x 2  . Diabetes mellitus without complication     diet and exercise controlled - no meds  . Anemia     History    Past Surgical History  Procedure Laterality Date  . Tubal ligation    . Dilation and curettage of uterus      MAB  . Wisdom tooth extraction      Family History  Problem Relation Age of Onset  . Cancer Other   . Hypertension Other   . Diabetes Other     Social History:  reports that she has never smoked. She has never used smokeless tobacco. She reports that she does not drink alcohol or use illicit drugs.  Allergies:  Allergies  Allergen Reactions  . Bee Venom Anaphylaxis     Prescriptions prior to admission  Medication Sig Dispense Refill  . ibuprofen (ADVIL,MOTRIN) 200 MG tablet Take 400 mg by mouth at bedtime as needed for mild pain.      Marland Kitchen. EPINEPHrine (EPIPEN) 0.3 mg/0.3 mL SOAJ injection Inject 0.3 mg into the muscle once as needed (for allergic reaction).        Review of Systems  Constitutional: Negative for fever and chills.  HENT: Negative for hearing loss.   Eyes: Negative for blurred vision and double vision.  Respiratory: Negative for cough.   Cardiovascular: Negative for chest pain.  Gastrointestinal: Negative for heartburn and nausea.  Genitourinary: Negative for dysuria.  Musculoskeletal: Negative for myalgias.  Skin: Negative for rash.  Neurological: Negative for dizziness, tingling and headaches.  Psychiatric/Behavioral: Negative for depression.  All other systems reviewed and are negative.   Blood pressure 136/82, pulse 73, temperature 98.4 F (36.9 C), temperature source Oral, resp. rate 18, last menstrual period 02/06/2014, SpO2 98.00%. Physical Exam Vulva: no masses, atrophy, or lesions. Bladder/Urethra: no urethral discharge or mass and normal meatus and bladder non distended. Vagina no tenderness, erythema, cystocele, rectocele, abnormal vaginal discharge, or vesicle(s) or ulcers. Cervix: no discharge or cervical motion tenderness and grossly normal. Uterus: tender; at fundus, some decent. Adnexa/Parametria: no parametrial tenderness or mass and no adnexal tenderness or ovarian mass.   Results for orders placed during  the hospital encounter of 02/24/14 (from the past 24 hour(s))  GLUCOSE, CAPILLARY     Status: Abnormal   Collection Time    02/24/14 10:36 AM      Result Value Ref Range   Glucose-Capillary 100 (*) 70 - 99 mg/dL    No results found.  Assessment/Plan: 38 yo AAF with chronic pelvic pain, heavier menses.  We discussed that the possible etiology of her dysmenorrhea may be endometriosis.  We discussed what is  endometriosis  and that it may be ectopic endometrial implants in the pelvis. Patient desire definitive management in form of a hysterectomy.  Patient counseled that removing the uterus alone may not alleviate her pain if her pain is caused by endometriosis; as endometriosis is stimulated by flux in estrogen secreted by her ovaries.  We discussed that if there is endometriotic implants during the surgery we can at that time remove her ovaries.  Patient desires ovarian conservation of her ovaries if there is no visible endometriosis (she is aware that it may be microscopic) The R/B of surgery were discussed w/ the pt to include, but not limited to bleeding/transfusion, infection, wound breakdown/poor healing, damage to organs in abd/pelvis w/ possible need for further surgical repair, need for abdominal incision to complete procedure, blood clot, PE/MI/stroke. Additionally, risks/benefits of ovarian preservation were discussed to include 1/70 lifetime risk of ovarian cancer and 5-10% risk for need of future surgery for ovarian pathology. Pt understands these risks and consents to surgery.  Will schedule patient for LAVH / possible BSO / cystoscopy   Chiara Coltrin STACIA 02/24/2014, 11:30 AM

## 2014-02-24 NOTE — Transfer of Care (Signed)
Immediate Anesthesia Transfer of Care Note  Patient: Renee Knapp  Procedure(s) Performed: Procedure(s): LAPAROSCOPIC ASSISTED VAGINAL HYSTERECTOMY WITH BILATERAL SALPINGECTOMY  (N/A) CYSTOSCOPY (N/A)  Patient Location: PACU  Anesthesia Type:General  Level of Consciousness: awake, alert  and oriented  Airway & Oxygen Therapy: Patient Spontanous Breathing and Patient connected to nasal cannula oxygen  Post-op Assessment: Report given to PACU RN and Post -op Vital signs reviewed and stable  Post vital signs: Reviewed and stable  Complications: No apparent anesthesia complications

## 2014-02-24 NOTE — Anesthesia Postprocedure Evaluation (Signed)
  Anesthesia Post-op Note  Anesthesia Post Note  Patient: Renee Knapp  Procedure(s) Performed: Procedure(s) (LRB): LAPAROSCOPIC ASSISTED VAGINAL HYSTERECTOMY WITH BILATERAL SALPINGECTOMY  (N/A) CYSTOSCOPY (N/A)  Anesthesia type: General  Patient location: PACU  Post pain: Pain level controlled  Post assessment: Post-op Vital signs reviewed  Last Vitals:  Filed Vitals:   02/24/14 1715  BP: 135/64  Pulse: 105  Temp:   Resp: 16    Post vital signs: Reviewed  Level of consciousness: sedated  Complications: No apparent anesthesia complications

## 2014-02-24 NOTE — Anesthesia Preprocedure Evaluation (Addendum)
Anesthesia Evaluation  Patient identified by MRN, date of birth, ID band Patient awake    Reviewed: Allergy & Precautions, H&P , Patient's Chart, lab work & pertinent test results, reviewed documented beta blocker date and time   History of Anesthesia Complications Negative for: history of anesthetic complications  Airway Mallampati: III TM Distance: >3 FB Neck ROM: full    Dental   Pulmonary  breath sounds clear to auscultation        Cardiovascular Exercise Tolerance: Good Rhythm:regular Rate:Normal     Neuro/Psych    GI/Hepatic   Endo/Other  diabetesMorbid obesity  Renal/GU      Musculoskeletal   Abdominal   Peds  Hematology  (+) anemia ,   Anesthesia Other Findings   Reproductive/Obstetrics                          Anesthesia Physical Anesthesia Plan  ASA: II  Anesthesia Plan: General ETT   Post-op Pain Management:    Induction:   Airway Management Planned:   Additional Equipment:   Intra-op Plan:   Post-operative Plan:   Informed Consent: I have reviewed the patients History and Physical, chart, labs and discussed the procedure including the risks, benefits and alternatives for the proposed anesthesia with the patient or authorized representative who has indicated his/her understanding and acceptance.   Dental Advisory Given  Plan Discussed with: CRNA and Surgeon  Anesthesia Plan Comments:        Anesthesia Quick Evaluation

## 2014-02-24 NOTE — Brief Op Note (Signed)
02/24/2014  3:58 PM  PATIENT:  Renee Knapp  38 y.o. female  PRE-OPERATIVE DIAGNOSIS:  PELVIC PAIN  POST-OPERATIVE DIAGNOSIS:  PELVIC PAIN  PROCEDURE:  Procedure(s): LAPAROSCOPIC ASSISTED VAGINAL HYSTERECTOMY WITH BILATERAL SALPINGECTOMY  (N/A) CYSTOSCOPY (N/A)  SURGEON:  Surgeon(s) and Role:    * Essie HartWalda Denys Labree, MD - Primary    * Mickel Baasichard D Kaplan, MD - Assisting   ANESTHESIA:   general  EBL:  Total I/O In: 1000 [I.V.:1000] Out: 850 [Urine:550; Blood:300]  BLOOD ADMINISTERED:none  DRAINS: Urinary Catheter (Foley)   LOCAL MEDICATIONS USED:  LIDOCAINE  and Amount: 20 ml  SPECIMEN:  Source of Specimen:  uterus, cervix, bilateral fallopian tubes  DISPOSITION OF SPECIMEN:  PATHOLOGY  COUNTS:  YES  TOURNIQUET:  * No tourniquets in log *  DICTATION: .Note written in EPIC  PLAN OF CARE: Admit to inpatient   PATIENT DISPOSITION:  PACU - hemodynamically stable.   Delay start of Pharmacological VTE agent (>24hrs) due to surgical blood loss or risk of bleeding: not applicable  COMPLICATIONS: Bladder injury  FINDINGS: Normal appearing uterus, bilateral ovaries, fallopian tubes.  Filshie clip seen in posterior cul de sac.  Bladder injury during case repaired then water tight as seen on cystoscopy. Ureters with good brisk jets bilaterally  Renee Knapp

## 2014-02-25 ENCOUNTER — Encounter (HOSPITAL_COMMUNITY): Payer: Self-pay | Admitting: *Deleted

## 2014-02-25 LAB — CBC
HEMATOCRIT: 32.6 % — AB (ref 36.0–46.0)
Hemoglobin: 10.7 g/dL — ABNORMAL LOW (ref 12.0–15.0)
MCH: 26.8 pg (ref 26.0–34.0)
MCHC: 32.8 g/dL (ref 30.0–36.0)
MCV: 81.5 fL (ref 78.0–100.0)
Platelets: 304 10*3/uL (ref 150–400)
RBC: 4 MIL/uL (ref 3.87–5.11)
RDW: 13.7 % (ref 11.5–15.5)
WBC: 14.2 10*3/uL — AB (ref 4.0–10.5)

## 2014-02-25 LAB — COMPREHENSIVE METABOLIC PANEL
ALT: 12 U/L (ref 0–35)
AST: 10 U/L (ref 0–37)
Albumin: 3.1 g/dL — ABNORMAL LOW (ref 3.5–5.2)
Alkaline Phosphatase: 65 U/L (ref 39–117)
Anion gap: 14 (ref 5–15)
BILIRUBIN TOTAL: 0.2 mg/dL — AB (ref 0.3–1.2)
BUN: 6 mg/dL (ref 6–23)
CALCIUM: 9.3 mg/dL (ref 8.4–10.5)
CHLORIDE: 102 meq/L (ref 96–112)
CO2: 26 meq/L (ref 19–32)
CREATININE: 0.72 mg/dL (ref 0.50–1.10)
GFR calc non Af Amer: >90 mL/min (ref >90)
GLUCOSE: 137 mg/dL — AB (ref 70–99)
Potassium: 4.6 mEq/L (ref 3.7–5.3)
Sodium: 142 mEq/L (ref 137–147)
Total Protein: 6.7 g/dL (ref 6.0–8.3)

## 2014-02-25 MED ORDER — NITROFURANTOIN MONOHYD MACRO 100 MG PO CAPS: 100.0000 mg | ORAL_CAPSULE | Freq: Two times a day (BID) | ORAL | Status: AC

## 2014-02-25 MED ORDER — OXYCODONE-ACETAMINOPHEN 5-325 MG PO TABS: 1.0000 | ORAL_TABLET | ORAL | Status: DC | PRN

## 2014-02-25 MED ORDER — IBUPROFEN 600 MG PO TABS: 600.0000 mg | ORAL_TABLET | Freq: Four times a day (QID) | ORAL | Status: DC | PRN

## 2014-02-25 MED ORDER — DOCUSATE SODIUM 100 MG PO CAPS: 100.0000 mg | ORAL_CAPSULE | Freq: Two times a day (BID) | ORAL | Status: DC | PRN

## 2014-02-25 MED ORDER — INFLUENZA VAC SPLIT QUAD 0.5 ML IM SUSY
0.5000 mL | PREFILLED_SYRINGE | INTRAMUSCULAR | Status: AC
  Administered 2014-02-25: 0.5 mL via INTRAMUSCULAR
  Filled 2014-02-25: qty 0.5

## 2014-02-25 NOTE — Care Management (Signed)
Ur chart review completed.  

## 2014-02-25 NOTE — Addendum Note (Signed)
Addendum created 02/25/14 [id number] by Lenox Ahravid A Mikinzie Maciejewski, CRNA   Modules edited: Notes Section   Notes Section:  File: [id number]

## 2014-02-25 NOTE — Progress Notes (Signed)
1 Day Post-Op Procedure(s) (LRB): LAPAROSCOPIC ASSISTED VAGINAL HYSTERECTOMY WITH BILATERAL SALPINGECTOMY  (N/A) CYSTOSCOPY (N/A)  Subjective: Patient reports tolerating PO and + flatus.  She notes  Her pain from before is improved and her post operative pain is "not bad" She is ambulating without difficulty  Objective: I have reviewed patient's vital signs, intake and output, medications and labs.  General: alert, cooperative and no distress Cardio: regular rate and rhythm, S1, S2 normal, no murmur, click, rub or gallop GI: soft, non-tender; bowel sounds normal; no masses,  no organomegaly and incision: clean, dry and intact Extremities: extremities normal, atraumatic, no cyanosis or edema, Homans sign is negative, no sign of DVT and no edema, redness or tenderness in the calves or thighs Vaginal Bleeding: minimal vaginal packing removed.   Results for Renee Knapp, [name] (MRN [id number]) as of 02/25/2014 09:51  Ref. Range 02/25/2014 05:25  Sodium Latest Range: 137-147 mEq/L 142  Potassium Latest Range: 3.7-5.3 mEq/L 4.6  Chloride Latest Range: 96-112 mEq/L 102  CO2 Latest Range: 19-32 mEq/L 26  BUN Latest Range: 6-23 mg/dL 6  Creatinine Latest Range: 0.50-1.10 mg/dL 4.090.72  Calcium Latest Range: 8.4-10.5 mg/dL 9.3  GFR calc non Af Amer Latest Range: >90 mL/min >90  GFR calc Af Amer Latest Range: >90 mL/min >90  Glucose Latest Range: 70-99 mg/dL 811137 (H)  Anion gap Latest Range: 5-15  14  Alkaline Phosphatase Latest Range: 39-117 U/L 65  Albumin Latest Range: 3.5-5.2 g/dL 3.1 (L)  AST Latest Range: 0-37 U/L 10  ALT Latest Range: 0-35 U/L 12  Total Protein Latest Range: 6.0-8.3 g/dL 6.7  Total Bilirubin Latest Range: 0.3-1.2 mg/dL 0.2 (L)  WBC Latest Range: 4.0-10.5 K/uL 14.2 (H)  RBC Latest Range: 3.87-5.11 MIL/uL 4.00  Hemoglobin Latest Range: 12.0-15.0 g/dL 91.410.7 (L)  HCT Latest Range: 36.0-46.0 % 32.6 (L)  MCV Latest Range: 78.0-100.0 fL 81.5  MCH Latest Range: 26.0-34.0 pg 26.8   MCHC Latest Range: 30.0-36.0 g/dL 78.232.8  RDW Latest Range: 11.5-15.5 % 13.7  Platelets Latest Range: 150-400 K/uL 304    Assessment: s/p Procedure(s): LAPAROSCOPIC ASSISTED VAGINAL HYSTERECTOMY WITH BILATERAL SALPINGECTOMY  (N/A) CYSTOSCOPY (N/A): stable, progressing well and tolerating diet  Plan: Continue foley due to bladder injury Discharge home  LOS: 1 day    Renee Knapp STACIA 02/25/2014, 9:49 AM

## 2014-02-25 NOTE — Discharge Summary (Signed)
Physician Discharge Summary  Patient ID: Renee Knapp MRN: [id number] DOB/AGE: [birth year] 38 y.o.  Admit date: 02/24/2014 Discharge date: 02/25/2014  Admission Diagnoses: Chronic pelvic pain  Discharge Diagnoses:  Active Problems:   S/P laparoscopic assisted vaginal hysterectomy (LAVH)   Discharged Condition: good  Hospital Course: Patient admitted for scheduled surgery: LAVH / bilateral salpingectomy/ cystoscopy.  Hysterectomy performed, with complication of bladder injury which was repaired intraoperatively.  Patient did well postoperatively, ambulating, tolerating po and pain well controlled.  Her urine output was more than adequate and creatinine normal range. Patient discharged home on POD #1  Consults: None  Significant Diagnostic Studies: labs: H/H  Treatments: IV hydration, antibiotics: Ancef and surgery: LAVH, Bilateral salpingectomy, bladder repair, cystoscopy  Surgical Findings: Exam under anesthesia: Normal external vulva and normal vagina. Grossly normal appearing uterus Uterus: mobile, good descent adnexa: no palpable masses Laparoscopy: Normal appearing uterus, Filshie clip in Pouch of Douglas removed. Normal appearing fallopian Tubes and ovaries bilaterally. Appendix normal, liver edge and Gallbladder normal Cystoscopy: After bladder repair, bladder water tight, brisk jets from bilateral ureteral orifices.   Discharge Exam: Blood pressure 116/67, pulse 88, temperature 98.9 F (37.2 C), temperature source Oral, resp. rate 18, height 5\' 4"  (1.626 m), weight 93.895 kg (207 lb), last menstrual period 02/06/2014, SpO2 96.00%. General: alert, cooperative and no distress  Cardio: regular rate and rhythm, S1, S2 normal, no murmur, click, rub or gallop  GI: soft, non-tender; bowel sounds normal; no masses, no organomegaly and incision: clean, dry and intact  Extremities: extremities normal, atraumatic, no cyanosis or edema, Homans sign is negative, no sign of DVT and  no edema, redness or tenderness in the calves or thighs  Vaginal Bleeding: minimal  vaginal packing removed.    Disposition: 01-Home or Self Care  Discharge Instructions   Call MD for:  difficulty breathing, headache or visual disturbances    Complete by:  As directed      Call MD for:  extreme fatigue    Complete by:  As directed      Call MD for:  hives    Complete by:  As directed      Call MD for:  persistant dizziness or light-headedness    Complete by:  As directed      Call MD for:  persistant nausea and vomiting    Complete by:  As directed      Call MD for:  redness, tenderness, or signs of infection (pain, swelling, redness, odor or green/yellow discharge around incision site)    Complete by:  As directed      Call MD for:  severe uncontrolled pain    Complete by:  As directed      Call MD for:  temperature >100.4    Complete by:  As directed      Call MD for:    Complete by:  As directed   Abnormal foul smelling vaginal discharge or heavy vaginal bleeding     Diet - low sodium heart healthy    Complete by:  As directed      Discharge instructions    Complete by:  As directed   Call for your post op visit, and if you have any questions or concerns.     Discharge wound care:    Complete by:  As directed   No using savs or lotions on incisions as they have skin glue, remove dressing from belly button in 4-5 days     Driving Restrictions  Complete by:  As directed   No driving for 2 weeks or while taking percocet     Increase activity slowly    Complete by:  As directed      Lifting restrictions    Complete by:  As directed   No lifting anything heavier than 20 pounds     Sexual Activity Restrictions    Complete by:  As directed   No intercourse or anything in vagina for 4-6 weeks            Medication List         docusate sodium 100 MG capsule  Commonly known as:  COLACE  Take 1 capsule (100 mg total) by mouth 2 (two) times daily as needed for mild  constipation.     EPIPEN 0.3 mg/0.3 mL Soaj injection  Generic drug:  EPINEPHrine  Inject 0.3 mg into the muscle once as needed (for allergic reaction).     ibuprofen 600 MG tablet  Commonly known as:  ADVIL,MOTRIN  Take 1 tablet (600 mg total) by mouth every 6 (six) hours as needed for mild pain or moderate pain.     oxyCODONE-acetaminophen 5-325 MG per tablet  Commonly known as:  PERCOCET/ROXICET  Take 1-2 tablets by mouth every 4 (four) hours as needed for moderate pain (moderate to severe pain (when tolerating fluids)).         SignedWynonia Hazard: Ankita Newcomer STACIA 02/25/2014, 9:56 AM

## 2014-02-25 NOTE — Anesthesia Postprocedure Evaluation (Signed)
  Anesthesia Post-op Note  Patient: Renee Knapp  Procedure(s) Performed: Procedure(s): LAPAROSCOPIC ASSISTED VAGINAL HYSTERECTOMY WITH BILATERAL SALPINGECTOMY  (N/A) CYSTOSCOPY (N/A)  Patient Location: PACU and [facility] Unit  Anesthesia Type:General  Level of Consciousness: awake, alert  and oriented  Airway and Oxygen Therapy: Patient Spontanous Breathing  Post-op Pain: mild  Post-op Assessment: Post-op Vital signs reviewed, Respiratory Function Stable, Patent Airway, No signs of Nausea or vomiting, Adequate PO intake and Pain level controlled  Post-op Vital Signs: Reviewed and stable  Last Vitals:  Filed Vitals:   02/25/14 0552  BP: 116/67  Pulse: 88  Temp: 37.2 C  Resp: 18    Complications: No apparent anesthesia complications

## 2014-02-25 NOTE — Op Note (Addendum)
OPERATIVE NOTE  Renee Knapp  DOB:    [birth year]  MRN:    [id number]  CSN:    [id number]  Date of Surgery:  02/24/2014  Preoperative Diagnosis: Chronic pelvic pain  Postoperative Diagnosis: Same as above  Procedure: Laparoscopic Assisted Vaginal Hysterectomy Bilateral salpingectomy Repair of bladder Cystoscopy  Surgeon: Delila SpenceWalda S. Jarris Kortz MD  Assistant: Ilda Moriichard Kaplan MD  Anesthetic: General  Disposition:  The patient presents with the above-mentioned diagnosis. She understands the indications for surgical procedure.  She also understands the alternative treatment options. She accepts the risk of, but not limited to, anesthetic complications, bleeding, infections, and possible damage to the surrounding organs.  Findings: Exam under anesthesia: Normal external vulva and normal vagina. Grossly normal appearing uterus Uterus: mobile, good descent adnexa: no palpable masses Laparoscopy: Normal appearing uterus, Filshie clip in Pouch of Douglas removed. Normal appearing fallopian Tubes and ovaries bilaterally. Appendix normal, liver edge and Gallbladder normal Cystoscopy: After bladder repair, bladder water tight, brisk jets from bilateral ureteral orifices.   Procedure:  The patient was taken to the operating room where a general anesthetic was given. The patient's  abdomen was prepped with DuraPrep. The perineum and vagina were prepped with multiple layers of Betadine. A Foley catheter was placed in the bladder. An examination under anesthesia was performed. A Hulka tenaculum was placed inside the uterus. The patient was sterilely draped.  A subumbilical incision was made and the 11mm Excel Visiport attached to the 10 mm 0-degree laparoscope was used to attempt direct entry into the peritoneal cavity while tenting up the abdomen. This was not successful, so open laparoscopy was performed but identifying the fascia, tenting it up with Kocher clamps and entering it sharply using  Mayo scissors. The ends of the fascia were tagged with 0-vicryl on a UR-6 needle. The peritoneum was tented up between two hemostats and entered sharply using metzenbaum scissors. The trocar was placed into the abdomen with laparoscope attached and entry into the peritoneal cavity was confirm by video visualization. Approximately 3 L of CO2 gas was placed in the abdomen and the patient was placed in steep Trendelenburg.   A small incision was made along the right lower abdomen and a 5 mm trocar was inserted into the abdominal cavity under direct visualization. An identical procedure was carried out on the opposite side. A complete pelvic survey was performed with findings noted above. Pictures were taken of the patient's pelvic structures. The Filshie clip in the posterior cul de sac was removed. The right fallopian tube was identified and followed to its fimbriated end. The proximal portion of the right fallopian tube was cauterized and  transected off the ovary using the 5mm Ligasure. The left fallopian tube was transected off the uterine corpus and placed in the posterior cul de sac to be retrieved later.  The right round ligament was identified. The round ligament was cauterized using the Ligasure. The round ligament was then cut.  The right utero-ovarian ligament was cauterized and cut. The bladder flap was developed anteriorly. We then located the left round ligament. The left round ligament was cauterized and cut using the bipolar cautery. The left utero-ovarian ligament were cauterized and cut as we did on the opposite side. The bladder flap was developed further. At this point we felt we were ready to proceed with the vaginal portion of the procedure. The patient was placed in a more lithotomy position. A weighted speculum was placed in the posterior vagina. The cervix was injected with  one percent lidocaine with epinephrine. A circumferential incision was made around the cervix. The vaginal mucosa was  advanced anteriorly and posteriorly. The anterior cul-de-sac could not be adequately entered so in the posterior cul-de-sac were sharply entered with Mayo scissors. The posterior peritoneum was tagged to the posterior vagina using 0-vicryl. A duck bill weight speculum was placed in the posterior cul de sac. The cardinal ligaments were clamped with Heaney clamps, transected and suture ligated with 0-vicryl. Attempt was made to enter the anterior cul de sac, however it was noted that the bladder was entered inadvertently as the foley bulb was palpated. The bladder was repaired in layers by first re-approximating the mucosal edges with 2-0 monocryl then imbricating the adventitia with a second layer. The hysterectomy was resumed and  alternating from right to left the uterosacral ligaments, paracervical tissues, parametrial tissues, and uterine arteries were clamped, cut, sutured, and tied securely using 0-vicryl.  The uterus was removed from the operative field. Hemostasis was confirmed.   Cystoscopy was performed at this time. A complete bladder survey was performed. The repair appear to be water tight. The injury was noted to be present mid-left of the trigone. The ureteral orifices were visualized on both sides and noted to have brisk jets. A new foley catheter was placed.   The sutures attached to the uterosacral ligaments were brought out through the vaginal angles and then tied securely. A McCall culdoplasty suture was placed in the posterior cul-de-sac incorporating the uterosacral ligaments bilaterally and the posterior peritoneum. A final check was made for hemostasis and again hemostasis was confirmed. The vaginal cuff was closed using running locked sutures incorporating the anterior vaginal mucosa, the anterior peritoneum, posterior peritoneum, and the posterior vaginal mucosa. The McCall culdoplasty suture was tied securely and the apex of the vagina was noted to elevate into the midpelvis.  The  operator then changed gown and gloves. The pneumoperitoneum was reestablished. The pelvis was inspected and hemostasis was adequate. The pelvis was irrigated. We felt that we are ready to end the procedure. The 5 mm trochars were removed under direct visualization. The pneumoperitoneum was allowed to escape. The subumbilical trocar was removed. The subumbilical incision was closed the previously tagged suture of 0 Vicryl followed by skin closure of 3-0 Monocryl and dermabond. The 5mm incisions were closed with Dermabond.  The patient tolerated her procedure well. She was awakened from her anesthetic without difficulty and then transported to the recovery room in stable condition. Sponge, needle, and instrument counts were correct on 3 occasions. The estimated blood loss was 300 cc's.  The uterus, with cervix and bilateral fallopian tubes were sent to pathology.  Renee Knapp

## 2014-02-25 NOTE — Discharge Instructions (Signed)
Foley Catheter Care °A Foley catheter is a soft, flexible tube that is placed into the bladder to drain urine. A Foley catheter may be inserted if: °· You leak urine or are not able to control when you urinate (urinary incontinence). °· You are not able to urinate when you need to (urinary retention). °· You had prostate surgery or surgery on the genitals. °· You have certain medical conditions, such as multiple sclerosis, dementia, or a spinal cord injury. °If you are going home with a Foley catheter in place, follow the instructions below. °TAKING CARE OF THE CATHETER °1. Wash your hands with soap and water. °2. Using mild soap and warm water on a clean washcloth: °· Clean the area on your body closest to the catheter insertion site using a circular motion, moving away from the catheter. Never wipe toward the catheter because this could sweep bacteria up into the urethra and cause infection. °· Remove all traces of soap. Pat the area dry with a clean towel. For males, reposition the foreskin. °3. Attach the catheter to your leg so there is no tension on the catheter. Use adhesive tape or a leg strap. If you are using adhesive tape, remove any sticky residue left behind by the previous tape you used. °4. Keep the drainage bag below the level of the bladder, but keep it off the floor. °5. Check throughout the day to be sure the catheter is working and urine is draining freely. Make sure the tubing does not become kinked. °6. Do not pull on the catheter or try to remove it. Pulling could damage internal tissues. °TAKING CARE OF THE DRAINAGE BAGS °You will be given two drainage bags to take home. One is a large overnight drainage bag, and the other is a smaller leg bag that fits underneath clothing. You may wear the overnight bag at any time, but you should never wear the smaller leg bag at night. Follow the instructions below for how to empty, change, and clean your drainage bags. °Emptying the Drainage Bag °You must  empty your drainage bag when it is  -½ full or at least 2-3 times a day. °1. Wash your hands with soap and water. °2. Keep the drainage bag below your hips, below the level of your bladder. This stops urine from going back into the tubing and into your bladder. °3. Hold the dirty bag over the toilet or a clean container. °4. Open the pour spout at the bottom of the bag and empty the urine into the toilet or container. Do not let the pour spout touch the toilet, container, or any other surface. Doing so can place bacteria on the bag, which can cause an infection. °5. Clean the pour spout with a gauze pad or cotton ball that has rubbing alcohol on it. °6. Close the pour spout. °7. Attach the bag to your leg with adhesive tape or a leg strap. °8. Wash your hands well. °Changing the Drainage Bag °Change your drainage bag once a month or sooner if it starts to smell bad or look dirty. Below are steps to follow when changing the drainage bag. °1. Wash your hands with soap and water. °2. Pinch off the rubber catheter so that urine does not spill out. °3. Disconnect the catheter tube from the drainage tube at the connection valve. Do not let the tubes touch any surface. °4. Clean the end of the catheter tube with an alcohol wipe. Use a different alcohol wipe to clean the   end of the drainage tube. 5. Connect the catheter tube to the drainage tube of the clean drainage bag. 6. Attach the new bag to the leg with adhesive tape or a leg strap. Avoid attaching the new bag too tightly. 7. Wash your hands well. Cleaning the Drainage Bag 1. Wash your hands with soap and water. 2. Wash the bag in warm, soapy water. 3. Rinse the bag thoroughly with warm water. 4. Fill the bag with a solution of white vinegar and water (1 cup vinegar to 1 qt warm water [.2 L vinegar to 1 L warm water]). Close the bag and soak it for 30 minutes in the solution. 5. Rinse the bag with warm water. 6. Hang the bag to dry with the pour spout open  and hanging downward. 7. Store the clean bag (once it is dry) in a clean plastic bag. 8. Wash your hands well. PREVENTING INFECTION  Wash your hands before and after handling your catheter.  Take showers daily and wash the area where the catheter enters your body. Do not take baths. Replace wet leg straps with dry ones, if this applies.  Do not use powders, sprays, or lotions on the genital area. Only use creams, lotions, or ointments as directed by your caregiver.  For females, wipe from front to back after each bowel movement.  Drink enough fluids to keep your urine clear or pale yellow unless you have a fluid restriction.  Do not let the drainage bag or tubing touch or lie on the floor.  Wear cotton underwear to absorb moisture and to keep your skin drier. SEEK MEDICAL CARE IF:   Your urine is cloudy or smells unusually bad.  Your catheter becomes clogged.  You are not draining urine into the bag or your bladder feels full.  Your catheter starts to leak. SEEK IMMEDIATE MEDICAL CARE IF:   You have pain, swelling, redness, or pus where the catheter enters the body.  You have pain in the abdomen, legs, lower back, or bladder.  You have a fever.  You see blood fill the catheter, or your urine is pink or red.  You have nausea, vomiting, or chills.  Your catheter gets pulled out. MAKE SURE YOU:   Understand these instructions.  Will watch your condition.  Will get help right away if you are not doing well or get worse. Document Released: 05/09/2005 Document Revised: 09/23/2013 Document Reviewed: 04/30/2012 [facility] Patient Information 2015 [place], [place]. This information is not intended to replace advice given to you by your health care provider. Make sure you discuss any questions you have with your health care provider.  Whipple Procedure Care After Refer to this sheet in the next few weeks. These instructions provide you with information on caring for yourself  after your procedure. Your caregiver may also give you more specific instructions. Your treatment has been planned according to current medical practices, but problems sometimes occur. Call your caregiver if you have any problems or questions after your procedure. HOME CARE INSTRUCTIONS Medicines  Take pain medicine as prescribed by your caregiver. Do not take any over-the-counter pain medicines unless your caregiver says it is okay. Some pain medicines can cause bleeding for several weeks after surgery.  Constipation is common after this procedure. You may need to take medicine to prevent this.  Some people have trouble digesting food after a Whipple procedure. Your caregiver may give you medicine to help with digestion. Wound care  You may have drainage tubes still in  place when you go home. These tubes need to stay in place until no more fluid is draining from your body. Your caregiver will explain what you need to do. Follow the instructions carefully. Note the daily amount of drainage and color of the fluid in the drain. Be sure to ask when you should return to have the tubes taken out.  You may need to go back to have your stitches (sutures) or staples taken out. Make sure you know when to do that.  Carefully check your surgical cut (incision) area every day. Make sure there are no signs of infection, such as:  Pain.  Swelling.  Redness.  Warmth.  An opening of the incision.  Bleeding or leaking fluid.  Keep the incision area dry. Do not use lotions or creams unless your caregiver tells you to. Diet  You may not feel like eating for a while. This is normal and may last for a few weeks. When you are able to eat, try to eat:  Fruits and vegetables.  Foods with lean protein. Examples are boneless and skinless chicken breasts, lean beef, egg whites, and seafood like tuna and shrimp.  Low-fat dairy products.  Foods that are high in fiber. Examples are whole grains, beans,  most fruits, and nuts.  Do not eat foods that contain a lot of fat. They can be hard to digest.  Drink enough fluids to keep your urine clear or pale yellow. This helps prevent constipation.  Try eating small portions more often than 3 times a day. Do not skip meals.  Weigh yourself once a week. Wear the same amount of clothes each time you weigh yourself. Activities  Do not lift anything heavy for 3 to 6 weeks after your surgery. Do not lift anything heavier than 10 pounds (4.5 kg) until your caregiver says it is okay.  Try to walk 100 yards (90 meters) every day. Do not push yourself too hard. After a few weeks, start to slowly increase how far you walk.  You can take showers after your bandages are off. Do not take tub baths or swim until your caregiver says it is okay.  Do not drive if you are taking narcotic pain medicines.  Ask your caregiver whether it is okay for you do to certain activities, such as going back to work, driving a car, or having sex. It may be a few months before you can go back to all your normal activities. Follow up  Keep all your appointments. This is how your caregiver can tell if you are getting better.  Call your caregiver with any questions. SEEK MEDICAL CARE IF:  Your appetite does not get better.  You have nausea that will not go away.  You have constipation.  Your pain does not go away, even after taking pain medicine.  You become very thirsty, overly tired, dizzy, or you need to urinate often. SEEK IMMEDIATE MEDICAL CARE IF:  You cannot eat.  You are vomiting.  You have very bad pain that is getting worse.  You are very tired.  You have very bad constipation or diarrhea.  Your skin around the incision or drainage tubes becomes swollen, red, or leaks blood or other fluid.  Your incision or drainage area hurts.  You notice a bad smell or a change in the color of fluid in the drainage tube.  Your incision starts to open.  You  have a fever.  You have chest pain or difficulty breathing. MAKE SURE  YOU:  Understand these instructions.  Will watch your condition.  Will get help right away if you are not doing well or get worse. Document Released: 12/20/2010 Document Revised: 08/01/2011 Document Reviewed: 12/20/2010 [facility] Patient Information 2015 [place], [place]. This information is not intended to replace advice given to you by your health care provider. Make sure you discuss any questions you have with your health care provider.

## 2014-02-25 NOTE — Progress Notes (Signed)
Pt  Out in wheelchair   Teaching complete  

## 2014-03-24 ENCOUNTER — Encounter (HOSPITAL_COMMUNITY): Payer: Self-pay | Admitting: *Deleted

## 2014-10-05 ENCOUNTER — Encounter (HOSPITAL_COMMUNITY): Payer: Self-pay | Admitting: Family Medicine

## 2014-10-05 ENCOUNTER — Emergency Department (HOSPITAL_COMMUNITY)
Admission: EM | Admit: 2014-10-05 | Discharge: 2014-10-05 | Disposition: A | Discharge status: Home / Self Care | Payer: Medicaid Other | Source: Home / Self Care | Attending: Family Medicine | Admitting: Family Medicine

## 2014-10-05 DIAGNOSIS — R21 Rash and other nonspecific skin eruption: Secondary | ICD-10-CM

## 2014-10-05 LAB — SEDIMENTATION RATE: Sed Rate: 25 mm/hr — ABNORMAL HIGH (ref 0–22)

## 2014-10-05 LAB — C-REACTIVE PROTEIN: CRP: 1.5 mg/dL — ABNORMAL HIGH (ref <1.0)

## 2014-10-05 MED ORDER — PREDNISONE 10 MG (48) PO TBPK: ORAL_TABLET | Freq: Every day | ORAL | Status: DC

## 2014-10-05 MED ORDER — DEXAMETHASONE 4 MG PO TABS
10.0000 mg | ORAL_TABLET | Freq: Once | ORAL | Status: AC
  Administered 2014-10-05: 10 mg via ORAL

## 2014-10-05 MED ORDER — DEXAMETHASONE 10 MG/ML FOR PEDIATRIC ORAL USE
INTRAMUSCULAR | Status: AC
Start: 2014-10-05 — End: 2014-10-05
  Filled 2014-10-05: qty 1

## 2014-10-05 NOTE — Discharge Instructions (Signed)
The cause of your rash is not immediately clear but may be due to allergic reaction to something he came in contact with  or may be the beginnings of an autoimmune illness. You were treated with steroids but need to continue your steroids for the full 2 weeks. Please use Benadryl as needed for additional relief. Please call your primary care physician for follow-up in her office in 1-2 weeks. Please get to the emergency room if he gets significantly worse.

## 2014-10-05 NOTE — ED Notes (Signed)
ANA, RF and ANTI-DNA blood tests sent to lab also.

## 2014-10-05 NOTE — ED Notes (Signed)
Pt awoke this a.m with facial pain , face is swollen and itching.

## 2014-10-05 NOTE — ED Provider Notes (Signed)
CSN: [id number]     Arrival date & time 10/05/14  1134 History   First MD Initiated Contact with Patient 10/05/14 1307     Chief Complaint  Patient presents with  . Facial Swelling   (Consider location/radiation/quality/duration/timing/severity/associated sxs/prior Treatment) HPI  Facial rash: itching and painful. Across cheeks and forehead. Started this am. Denies any new soaps lotions detergents, dietary changes. No new environmental exposures. She is constantly getting worse. Denies fevers, nausea, vomiting, headache, neck stiffness, chest pain, shortness of breath, palpitations, dysuria, frequency, nausea, vomiting. Patient states she has extended family members with some kind of autoimmune illnesses but is unsure what they are.    Past Medical History  Diagnosis Date  . Plantar fasciitis, bilateral     History - recvd cortisone injections bilateral- no current prob as 01/2014  . Obesity   . SVD (spontaneous vaginal delivery)     x 2  . Diabetes mellitus without complication     diet and exercise controlled - no meds  . Anemia     History   Past Surgical History  Procedure Laterality Date  . Tubal ligation    . Dilation and curettage of uterus      MAB  . Wisdom tooth extraction    . Laparoscopic assisted vaginal hysterectomy N/A 02/24/2014    Procedure: LAPAROSCOPIC ASSISTED VAGINAL HYSTERECTOMY WITH BILATERAL SALPINGECTOMY ;  Surgeon: Sanjuana Kava, MD;  Location: [facility];  Service: Gynecology;  Laterality: N/A;  . Cystoscopy N/A 02/24/2014    Procedure: CYSTOSCOPY;  Surgeon: Sanjuana Kava, MD;  Location: [facility];  Service: Gynecology;  Laterality: N/A;   Family History  Problem Relation Age of Onset  . Cancer Other   . Hypertension Other   . Diabetes Other    History  Substance Use Topics  . Smoking status: Never Smoker   . Smokeless tobacco: Never Used  . Alcohol Use: No   OB History    Gravida Para Term Preterm AB TAB SAB Ectopic Multiple Living   _0 Review of Systems Per HPI with all other pertinent systems negative. \ Allergies  Bee venom  Home Medications   Prior to Admission medications   Medication Sig Start Date End Date Taking? Authorizing Provider  EPINEPHrine (EPIPEN) 0.3 mg/0.3 mL SOAJ injection Inject 0.3 mg into the muscle once as needed (for allergic reaction).    Historical Provider, MD  predniSONE (STERAPRED UNI-PAK 48 TAB) 10 MG (48) TBPK tablet Take by mouth daily. Take as instructed 10/05/14   Waldemar Dickens, MD   BP 124/78 mmHg  Pulse 78  Temp(Src) 97.2 F (36.2 C) (Oral)  SpO2 96%  LMP 02/06/2014 Physical Exam Physical Exam  Constitutional: oriented to person, place, and time. appears well-developed and well-nourished. No distress.  HENT:  Head: Normocephalic and atraumatic.  Eyes: EOMI. PERRL.  Neck: Normal range of motion.  Cardiovascular: RRR, no m/r/g, 2+ distal pulses,  Pulmonary/Chest: Effort normal and breath sounds normal. No respiratory distress.  Abdominal: Soft. Bowel sounds are normal. NonTTP, no distension.  Musculoskeletal: Normal range of motion. Non ttp, no effusion.  Neurological: alert and oriented to person, place, and time.  Skin: Mild erythematous rash across the cheeks, nasal bridge and lower part of the forehead. Looks like the beginnings of a classic malar rash.  Psychiatric: normal mood and affect. behavior is normal. Judgment and thought content normal.   ED Course  Procedures (including critical care  time) Labs Review Labs Reviewed - No data to display  Imaging Review No results found.   MDM   1. Rash    Etiology not immediately clear. Does not appear to be infectious. Suspect hypersensitivity dermatitis versus autoimmune illness. Start steroids and Benadryl. Initiation of lab workup started and patient follow-up with her PCP for further intervention if needed. CRP, ESR, ANA, DS DNA, RA   Waldemar Dickens, MD 10/05/14 [phone]

## 2014-10-06 LAB — ANTI-DNA ANTIBODY, DOUBLE-STRANDED: ds DNA Ab: 5 IU/mL — ABNORMAL HIGH

## 2014-10-06 LAB — RHEUMATOID FACTOR: RHEUMATOID FACTOR: 26.9 [IU]/mL — AB (ref 0.0–13.9)

## 2014-10-06 NOTE — ED Notes (Signed)
Abnormal lab results. Message sent to Dr. Merrell.  VassKonrad Doloresie MoselleYork, Sinclair Arrazola M 10/06/2014

## 2014-10-08 ENCOUNTER — Telehealth (HOSPITAL_COMMUNITY): Payer: Self-pay | Admitting: *Deleted

## 2014-10-08 NOTE — ED Notes (Signed)
Dr. Konrad DoloresMerrell wrote back the her rash is likely due to an autoimmune process that will require continuous monitoring and long term medical therapy. She will need to f/u with her PCP or a rheumatologist.  I called pt. Pt. verified x 2 and given results.  Pt. given Dr. Satira SarkMerrell's instructions.  She has a PCP, who can refer her to a rheumatologist if needed.  If her doctor is not on [facility], the office can call us, to fax over her results.  Pt. voiced understanding. Vassie MoselleYork, Yumi Insalaco M 10/08/2014

## 2015-05-07 ENCOUNTER — Encounter (HOSPITAL_COMMUNITY): Payer: Self-pay | Admitting: Emergency Medicine

## 2015-05-07 ENCOUNTER — Emergency Department (HOSPITAL_COMMUNITY)
Admission: EM | Admit: 2015-05-07 | Discharge: 2015-05-07 | Disposition: A | Discharge status: Home / Self Care | Payer: Medicaid Other | Source: Home / Self Care | Attending: Emergency Medicine | Admitting: Emergency Medicine

## 2015-05-07 DIAGNOSIS — H9203 Otalgia, bilateral: Secondary | ICD-10-CM

## 2015-05-07 DIAGNOSIS — B349 Viral infection, unspecified: Secondary | ICD-10-CM

## 2015-05-07 DIAGNOSIS — J040 Acute laryngitis: Secondary | ICD-10-CM | POA: Diagnosis not present

## 2015-05-07 LAB — POCT RAPID STREP A: STREPTOCOCCUS, GROUP A SCREEN (DIRECT): NEGATIVE

## 2015-05-07 MED ORDER — IBUPROFEN 800 MG PO TABS: 800.0000 mg | ORAL_TABLET | Freq: Three times a day (TID) | ORAL | Status: DC

## 2015-05-07 NOTE — ED Provider Notes (Signed)
HPI  SUBJECTIVE:  Renee Knapp is a 39 y.o. female who presents with sore throat starting yesterday. States that it has resolved today. She reports bilateral ear pain starting yesterday, and losing her voice today. She reports neck pain and a mild frontal headache that she states is "about to begin". She reports chills. No hearing changes, otorrhea. No nausea, vomiting, fevers, cough, wheeze, chest pain, shortness of breath. No abdominal pain. No body aches. No drooling, trismus, sensation of throat swelling shut, difficulty breathing, stridor. No nasal congestion, postnasal drip. No sinus pain/pressure, visual changes, photophobia, dental pain, neck stiffness. She does have several sick contacts with similar symptoms. She did not get a flu shot this year. She is a diabetic, but does not check her glucose at home. No history of hypertension.    Past Medical History  Diagnosis Date  . Plantar fasciitis, bilateral     History - recvd cortisone injections bilateral- no current prob as 01/2014  . Obesity   . SVD (spontaneous vaginal delivery)     x 2  . Diabetes mellitus without complication (HCC)     diet and exercise controlled - no meds  . Anemia     History    Past Surgical History  Procedure Laterality Date  . Tubal ligation    . Dilation and curettage of uterus      MAB  . Wisdom tooth extraction    . Laparoscopic assisted vaginal hysterectomy N/A 02/24/2014    Procedure: LAPAROSCOPIC ASSISTED VAGINAL HYSTERECTOMY WITH BILATERAL SALPINGECTOMY ;  Surgeon: Essie Hart, MD;  Location: [facility];  Service: Gynecology;  Laterality: N/A;  . Cystoscopy N/A 02/24/2014    Procedure: CYSTOSCOPY;  Surgeon: Essie Hart, MD;  Location: [facility];  Service: Gynecology;  Laterality: N/A;    Family History  Problem Relation Age of Onset  . Cancer Other   . Hypertension Other   . Diabetes Other     Social History  Substance Use Topics  . Smoking status: Never Smoker   . Smokeless tobacco: Never  Used  . Alcohol Use: No    No current facility-administered medications for this encounter.  Current outpatient prescriptions:  .  EPINEPHrine (EPIPEN) 0.3 mg/0.3 mL SOAJ injection, Inject 0.3 mg into the muscle once as needed (for allergic reaction)., Disp: , Rfl:  .  ibuprofen (ADVIL,MOTRIN) 800 MG tablet, Take 1 tablet (800 mg total) by mouth 3 (three) times daily., Disp: 30 tablet, Rfl: 0  Allergies  Allergen Reactions  . Bee Venom Anaphylaxis     ROS  As noted in HPI.   Physical Exam  BP 137/81 mmHg  Pulse 92  Temp(Src) 98.1 F (36.7 C) (Oral)  Resp 18  SpO2 100%  LMP 02/06/2014  Constitutional: Well developed, well nourished, no acute distress. Absent voice.  Eyes:  EOMI, conjunctiva normal bilaterally HENT: Normocephalic, atraumatic,mucus membranes moist. TMs normal bilaterally. EACs normal bilaterally. No sinus tenderness. No nasal congestion. No tenderness over TMJ bilaterally. Erythematous oropharynx, normal tonsils without exudates. Uvula midline Neck: No trapezial tenderness, no strap muscle tenderness. No meningismus. No drooling, stridor.  Lymph: Tender cervical lymphadenopathy bilaterally. Respiratory: Normal inspiratory effort Cardiovascular: Normal rate GI: nondistended skin: No rash, skin intact Musculoskeletal: no deformities Neurologic: Alert & oriented x 3, no focal neuro deficits Psychiatric: Speech and behavior appropriate   ED Course   Medications - No data to display  Orders Placed This Encounter  Procedures  . POCT rapid strep A [facility])    Standing  Status: Standing     Number of Occurrences: 1     Standing Expiration Date:     Results for orders placed or performed during the hospital encounter of 05/07/15 (from the past 24 hour(s))  POCT rapid strep A [facility]     Status: None   Collection Time: 05/07/15  6:27 PM  Result Value Ref Range   Streptococcus, Group A Screen (Direct) NEGATIVE NEGATIVE   No results  found.  ED Clinical Impression  Laryngitis  Otalgia, bilateral  Viral syndrome    ED Assessment/Plan  Rapid strep negative. Deferring culture. Patient has no complaints sore throat today. Feel that  the otalgia is referred pain from the lymphadenopathy in her neck. no evidence of sinusitis, OM, meningitis or airway compromise.  Presentation most consistent with a viral syndrome with laryngitis.  Home with ibuprofen 800, tylenol 1 gm, and increase fluids, work note.  Discussed labs, imaging, MDM, plan and followup with patient . Discussed sn/sx that should prompt return to the UC or ED. Patient  agrees with plan.  *This clinic note was created using Dragon dictation software. Therefore, there may be occasional mistakes despite careful proofreading.  ?    Domenick GongAshley Lowell Mcgurk, MD 05/07/15 2028

## 2015-05-07 NOTE — ED Notes (Signed)
C/o bilateral ear pain onset yest associated w/neck pain, HA, and chills Also reports she developed laryngitis this am and a ST A&O x4... No acute distress.

## 2015-05-10 LAB — CULTURE, GROUP A STREP: Strep A Culture: NEGATIVE

## 2015-06-26 ENCOUNTER — Encounter (HOSPITAL_COMMUNITY): Payer: Self-pay | Admitting: Emergency Medicine

## 2015-06-26 ENCOUNTER — Emergency Department (INDEPENDENT_AMBULATORY_CARE_PROVIDER_SITE_OTHER)
Admission: EM | Admit: 2015-06-26 | Discharge: 2015-06-26 | Disposition: A | Discharge status: Home / Self Care | Payer: Self-pay | Source: Home / Self Care | Attending: Family Medicine | Admitting: Family Medicine

## 2015-06-26 DIAGNOSIS — A084 Viral intestinal infection, unspecified: Secondary | ICD-10-CM

## 2015-06-26 MED ORDER — ONDANSETRON HCL 4 MG PO TABS: 4.0000 mg | ORAL_TABLET | Freq: Four times a day (QID) | ORAL | Status: DC

## 2015-06-26 NOTE — ED Provider Notes (Signed)
CSN: [id number]     Arrival date & time 06/26/15  1919 History   First MD Initiated Contact with Patient 06/26/15 2016     Chief Complaint  Patient presents with  . URI  . Abdominal Pain   (Consider location/radiation/quality/duration/timing/severity/associated sxs/prior Treatment) HPI Comments: Possibly 5 days ago this 40 year old female suffered PCP for a "stomach bug". She had few upper respiratory symptoms including a cough however her chief complaint was vomiting, diarrhea and chills. She was told to take Robitussin-DM but there were no medications or instructions regarding her GI symptoms. Denies having fevers. She states that she has vomited once today but she had no vomiting yesterday. She had diarrhea 4 times today and twice yesterday. She was feeling much better yesterday and decided to eat. Her meal included vegetable these so and date of chips. This caused an increase in her diarrhea. This may also lead in the worsening of her symptoms and thus her visit to the urgent care today. She is able to hold down liquids without vomiting. She is not showing signs of dehydration.   Past Medical History  Diagnosis Date  . Plantar fasciitis, bilateral     History - recvd cortisone injections bilateral- no current prob as 01/2014  . Obesity   . SVD (spontaneous vaginal delivery)     x 2  . Diabetes mellitus without complication (HCC)     diet and exercise controlled - no meds  . Anemia     History   Past Surgical History  Procedure Laterality Date  . Tubal ligation    . Dilation and curettage of uterus      MAB  . Wisdom tooth extraction    . Laparoscopic assisted vaginal hysterectomy N/A 02/24/2014    Procedure: LAPAROSCOPIC ASSISTED VAGINAL HYSTERECTOMY WITH BILATERAL SALPINGECTOMY ;  Surgeon: Essie Hart, MD;  Location: [facility];  Service: Gynecology;  Laterality: N/A;  . Cystoscopy N/A 02/24/2014    Procedure: CYSTOSCOPY;  Surgeon: Essie Hart, MD;  Location: [facility];  Service:  Gynecology;  Laterality: N/A;   Family History  Problem Relation Age of Onset  . Cancer Other   . Hypertension Other   . Diabetes Other    Social History  Substance Use Topics  . Smoking status: Never Smoker   . Smokeless tobacco: Never Used  . Alcohol Use: No   OB History    Gravida Para Term Preterm AB TAB SAB Ectopic Multiple Living   Review of Systems  Constitutional: Positive for activity change. Negative for fever and fatigue.  HENT: Negative.   Eyes: Negative.   Respiratory: Positive for cough. Negative for shortness of breath.   Cardiovascular: Negative for chest pain and leg swelling.  Gastrointestinal: Positive for nausea, vomiting, abdominal pain and diarrhea. Negative for constipation.       Crampy, knotty abdominal pain across the lower abdomen. Intermittent.  Genitourinary: Negative.   Musculoskeletal: Negative.   Skin: Negative.   Neurological: Negative.     Allergies  Bee venom  Home Medications   Prior to Admission medications   Medication Sig Start Date End Date Taking? Authorizing Provider  Chlorphen-Pseudoephed-APAP [facility] FLU/COLD PO) Take by mouth.   Yes Historical Provider, MD  Cholecalciferol (VITAMIN D PO) Take by mouth.   Yes Historical Provider, MD  guaiFENesin (MUCINEX) 600 MG 12 hr tablet Take by mouth 2 (two) times daily.   Yes Historical Provider, MD  METFORMIN HCL  PO Take by mouth.   Yes Historical Provider, MD  OVER THE COUNTER MEDICATION "medicine for nausea"   Yes Historical Provider, MD  EPINEPHrine (EPIPEN) 0.3 mg/0.3 mL SOAJ injection Inject 0.3 mg into the muscle once as needed (for allergic reaction).    Historical Provider, MD  ibuprofen (ADVIL,MOTRIN) 800 MG tablet Take 1 tablet (800 mg total) by mouth 3 (three) times daily. 05/07/15   Domenick Gong, MD  ondansetron (ZOFRAN) 4 MG tablet Take 1 tablet (4 mg total) by mouth every 6 (six) hours. 06/26/15   Hayden Rasmussen, NP   Meds Ordered and Administered this  Visit  Medications - No data to display  BP 137/90 mmHg  Pulse 86  Temp(Src) 98.5 F (36.9 C) (Oral)  SpO2 99%  LMP 02/06/2014 No data found.   Physical Exam  Constitutional: She appears well-developed and well-nourished. No distress.  Eyes: Conjunctivae and EOM are normal.  Neck: Normal range of motion. Neck supple.  Cardiovascular: Normal rate, regular rhythm, normal heart sounds and intact distal pulses.   Pulmonary/Chest: Effort normal and breath sounds normal. No respiratory distress. She has no wheezes. She has no rales.  Abdominal: Soft. Bowel sounds are normal. She exhibits no distension and no mass. There is no rebound and no guarding.  Mild generalized abdominal tenderness. No rebound or guarding. Bowel sounds are hyperactive. Normal pitch.  Musculoskeletal: Normal range of motion. She exhibits no edema.  Lymphadenopathy:    She has no cervical adenopathy.  Neurological: She is alert. No cranial nerve deficit. She exhibits normal muscle tone.  Skin: Skin is warm and dry.  Psychiatric: She has a normal mood and affect. Her behavior is normal.  Nursing note and vitals reviewed.   ED Course  Procedures (including critical care time)  Labs Review Labs Reviewed - No data to display  Imaging Review No results found.   Visual Acuity Review  Right Eye Distance:   Left Eye Distance:   Bilateral Distance:    Right Eye Near:   Left Eye Near:    Bilateral Near:         MDM   1. Viral gastroenteritis   Pt improving overall, another day of clear liquids. No fried foods and only a little at a time.  zofran as dir Clear liquids BRAT diet One immodium ad today only if needed.  Hayden Rasmussen, NP 06/26/15 2051

## 2015-06-26 NOTE — ED Notes (Signed)
Abdominal pain, nausea, vomiting and diarrhea since Monday 1/30.

## 2015-06-26 NOTE — Discharge Instructions (Signed)
Viral Gastroenteritis °Viral gastroenteritis is also known as stomach flu. This condition affects the stomach and intestinal tract. It can cause sudden diarrhea and vomiting. The illness typically lasts 3 to 8 days. Most people develop an immune response that eventually gets rid of the virus. While this natural response develops, the virus can make you quite ill. °CAUSES  °Many different viruses can cause gastroenteritis, such as rotavirus or noroviruses. You can catch one of these viruses by consuming contaminated food or water. You may also catch a virus by sharing utensils or other personal items with an infected person or by touching a contaminated surface. °SYMPTOMS  °The most common symptoms are diarrhea and vomiting. These problems can cause a severe loss of body fluids (dehydration) and a body salt (electrolyte) imbalance. Other symptoms may include: °· Fever. °· Headache. °· Fatigue. °· Abdominal pain. °DIAGNOSIS  °Your caregiver can usually diagnose viral gastroenteritis based on your symptoms and a physical exam. A stool sample may also be taken to test for the presence of viruses or other infections. °TREATMENT  °This illness typically goes away on its own. Treatments are aimed at rehydration. The most serious cases of viral gastroenteritis involve vomiting so severely that you are not able to keep fluids down. In these cases, fluids must be given through an intravenous line (IV). °HOME CARE INSTRUCTIONS  °· Drink enough fluids to keep your urine clear or pale yellow. Drink small amounts of fluids frequently and increase the amounts as tolerated. °· Ask your caregiver for specific rehydration instructions. °· Avoid: °¨ Foods high in sugar. °¨ Alcohol. °¨ Carbonated drinks. °¨ Tobacco. °¨ Juice. °¨ Caffeine drinks. °¨ Extremely hot or cold fluids. °¨ Fatty, greasy foods. °¨ Too much intake of anything at one time. °¨ Dairy products until 24 to 48 hours after diarrhea stops. °· You may consume probiotics.  Probiotics are active cultures of beneficial bacteria. They may lessen the amount and number of diarrheal stools in adults. Probiotics can be found in yogurt with active cultures and in supplements. °· Wash your hands well to avoid spreading the virus. °· Only take over-the-counter or prescription medicines for pain, discomfort, or fever as directed by your caregiver. Do not give aspirin to children. Antidiarrheal medicines are not recommended. °· Ask your caregiver if you should continue to take your regular prescribed and over-the-counter medicines. °· Keep all follow-up appointments as directed by your caregiver. °SEEK IMMEDIATE MEDICAL CARE IF:  °· You are unable to keep fluids down. °· You do not urinate at least once every 6 to 8 hours. °· You develop shortness of breath. °· You notice blood in your stool or vomit. This may look like coffee grounds. °· You have abdominal pain that increases or is concentrated in one small area (localized). °· You have persistent vomiting or diarrhea. °· You have a fever. °· The patient is a child younger than 3 months, and he or she has a fever. °· The patient is a child older than 3 months, and he or she has a fever and persistent symptoms. °· The patient is a child older than 3 months, and he or she has a fever and symptoms suddenly get worse. °· The patient is a baby, and he or she has no tears when crying. °MAKE SURE YOU:  °· Understand these instructions. °· Will watch your condition. °· Will get help right away if you are not doing well or get worse. °  °This information is not intended to replace   advice given to you by your health care provider. Make sure you discuss any questions you have with your health care provider. °  °Document Released: 05/09/2005 Document Revised: 08/01/2011 Document Reviewed: 02/23/2011 °Elsevier Interactive Patient Education ©2016 [employer] ° °Food Choices to Help Relieve Diarrhea, Adult °When you have diarrhea, the foods you eat and your  eating habits are very important. Choosing the right foods and drinks can help relieve diarrhea. Also, because diarrhea can last up to 7 days, you need to replace lost fluids and electrolytes (such as sodium, potassium, and chloride) in order to help prevent dehydration.  °WHAT GENERAL GUIDELINES DO I NEED TO FOLLOW? °· Slowly drink 1 cup (8 oz) of fluid for each episode of diarrhea. If you are getting enough fluid, your urine will be clear or pale yellow. °· Eat starchy foods. Some good choices include white rice, white toast, pasta, low-fiber cereal, baked potatoes (without the skin), saltine crackers, and bagels. °· Avoid large servings of any cooked vegetables. °· Limit fruit to two servings per day. A serving is ½ cup or 1 small piece. °· Choose foods with less than 2 g of fiber per serving. °· Limit fats to less than 8 tsp (38 g) per day. °· Avoid fried foods. °· Eat foods that have probiotics in them. Probiotics can be found in certain dairy products. °· Avoid foods and beverages that may increase the speed at which food moves through the stomach and intestines (gastrointestinal tract). Things to avoid include: °¨ High-fiber foods, such as dried fruit, raw fruits and vegetables, nuts, seeds, and whole grain foods. °¨ Spicy foods and high-fat foods. °¨ Foods and beverages sweetened with high-fructose corn syrup, honey, or sugar alcohols such as xylitol, sorbitol, and mannitol. °WHAT FOODS ARE RECOMMENDED? °Grains °White rice. White, French, or pita breads (fresh or toasted), including plain rolls, buns, or bagels. White pasta. Saltine, soda, or graham crackers. Pretzels. Low-fiber cereal. Cooked cereals made with water (such as cornmeal, farina, or cream cereals). Plain muffins. Matzo. Melba toast. Zwieback.  °Vegetables °Potatoes (without the skin). Strained tomato and vegetable juices. Most well-cooked and canned vegetables without seeds. Tender lettuce. °Fruits °Cooked or canned applesauce, apricots,  cherries, fruit cocktail, grapefruit, peaches, pears, or plums. Fresh bananas, apples without skin, cherries, grapes, cantaloupe, grapefruit, peaches, oranges, or plums.  °Meat and Other Protein Products °Baked or boiled chicken. Eggs. Tofu. Fish. Seafood. Smooth peanut butter. Ground or well-cooked tender beef, ham, veal, lamb, pork, or poultry.  °Dairy °Plain yogurt, kefir, and unsweetened liquid yogurt. Lactose-free milk, buttermilk, or soy milk. Plain hard cheese. °Beverages °Sport drinks. Clear broths. Diluted fruit juices (except prune). Regular, caffeine-free sodas such as ginger ale. Water. Decaffeinated teas. Oral rehydration solutions. Sugar-free beverages not sweetened with sugar alcohols. °Other °Bouillon, broth, or soups made from recommended foods.  °The items listed above may not be a complete list of recommended foods or beverages. Contact your dietitian for more options. °WHAT FOODS ARE NOT RECOMMENDED? °Grains °Whole grain, whole wheat, bran, or rye breads, rolls, pastas, crackers, and cereals. Wild or brown rice. Cereals that contain more than 2 g of fiber per serving. Corn tortillas or taco shells. Cooked or dry oatmeal. Granola. Popcorn. °Vegetables °Raw vegetables. Cabbage, broccoli, Brussels sprouts, artichokes, baked beans, beet greens, corn, kale, legumes, peas, sweet potatoes, and yams. Potato skins. Cooked spinach and cabbage. °Fruits °Dried fruit, including raisins and dates. Raw fruits. Stewed or dried prunes. Fresh apples with skin, apricots, mangoes, pears, raspberries, and strawberries.  °Meat   and Other Protein Products °Chunky peanut butter. Nuts and seeds. Beans and lentils. Bacon.  °Dairy °High-fat cheeses. Milk, chocolate milk, and beverages made with milk, such as milk shakes. Cream. Ice cream. °Sweets and Desserts °Sweet rolls, doughnuts, and sweet breads. Pancakes and waffles. °Fats and Oils °Butter. Cream sauces. Margarine. Salad oils. Plain salad dressings. Olives. Avocados.    °Beverages °Caffeinated beverages (such as coffee, tea, soda, or energy drinks). Alcoholic beverages. Fruit juices with pulp. Prune juice. Soft drinks sweetened with high-fructose corn syrup or sugar alcohols. °Other °Coconut. Hot sauce. Chili powder. Mayonnaise. Gravy. Cream-based or milk-based soups.  °The items listed above may not be a complete list of foods and beverages to avoid. Contact your dietitian for more information. °WHAT SHOULD I DO IF I BECOME DEHYDRATED? °Diarrhea can sometimes lead to dehydration. Signs of dehydration include dark urine and dry mouth and skin. If you think you are dehydrated, you should rehydrate with an oral rehydration solution. These solutions can be purchased at pharmacies, retail stores, or online.  °Drink ½-1 cup (120-240 mL) of oral rehydration solution each time you have an episode of diarrhea. If drinking this amount makes your diarrhea worse, try drinking smaller amounts more often. For example, drink 1-3 tsp (5-15 mL) every 5-10 minutes.  °A general rule for staying hydrated is to drink 1½-2 L of fluid per day. Talk to your health care provider about the specific amount you should be drinking each day. Drink enough fluids to keep your urine clear or pale yellow. °  °This information is not intended to replace advice given to you by your health care provider. Make sure you discuss any questions you have with your health care provider. °  °Document Released: 07/30/2003 Document Revised: 05/30/2014 Document Reviewed: 04/01/2013 °Elsevier Interactive Patient Education ©2016 [employer] ° °

## 2015-07-17 ENCOUNTER — Emergency Department (HOSPITAL_COMMUNITY)
Admission: EM | Admit: 2015-07-17 | Discharge: 2015-07-17 | Disposition: A | Discharge status: Home / Self Care | Payer: Self-pay | Attending: Emergency Medicine | Admitting: Emergency Medicine

## 2015-07-17 ENCOUNTER — Encounter (HOSPITAL_COMMUNITY): Payer: Self-pay | Admitting: Emergency Medicine

## 2015-07-17 DIAGNOSIS — Z862 Personal history of diseases of the blood and blood-forming organs and certain disorders involving the immune mechanism: Secondary | ICD-10-CM | POA: Insufficient documentation

## 2015-07-17 DIAGNOSIS — Z791 Long term (current) use of non-steroidal anti-inflammatories (NSAID): Secondary | ICD-10-CM | POA: Insufficient documentation

## 2015-07-17 DIAGNOSIS — R450 Nervousness: Secondary | ICD-10-CM

## 2015-07-17 DIAGNOSIS — Z8739 Personal history of other diseases of the musculoskeletal system and connective tissue: Secondary | ICD-10-CM | POA: Insufficient documentation

## 2015-07-17 DIAGNOSIS — E669 Obesity, unspecified: Secondary | ICD-10-CM | POA: Insufficient documentation

## 2015-07-17 DIAGNOSIS — R258 Other abnormal involuntary movements: Secondary | ICD-10-CM | POA: Insufficient documentation

## 2015-07-17 DIAGNOSIS — E119 Type 2 diabetes mellitus without complications: Secondary | ICD-10-CM | POA: Insufficient documentation

## 2015-07-17 DIAGNOSIS — Z7984 Long term (current) use of oral hypoglycemic drugs: Secondary | ICD-10-CM | POA: Insufficient documentation

## 2015-07-17 DIAGNOSIS — Z79899 Other long term (current) drug therapy: Secondary | ICD-10-CM | POA: Insufficient documentation

## 2015-07-17 LAB — COMPREHENSIVE METABOLIC PANEL
ALK PHOS: 75 U/L (ref 38–126)
ALT: 15 U/L (ref 14–54)
AST: 18 U/L (ref 15–41)
Albumin: 3.5 g/dL (ref 3.5–5.0)
Anion gap: 12 (ref 5–15)
BUN: 8 mg/dL (ref 6–20)
CALCIUM: 9.4 mg/dL (ref 8.9–10.3)
CHLORIDE: 104 mmol/L (ref 101–111)
CO2: 27 mmol/L (ref 22–32)
CREATININE: 0.76 mg/dL (ref 0.44–1.00)
GFR calc Af Amer: >60 mL/min (ref >60)
Glucose, Bld: 83 mg/dL (ref 65–99)
Potassium: 3.8 mmol/L (ref 3.5–5.1)
Sodium: 143 mmol/L (ref 135–145)
Total Bilirubin: 0.2 mg/dL — ABNORMAL LOW (ref 0.3–1.2)
Total Protein: 7.2 g/dL (ref 6.5–8.1)

## 2015-07-17 LAB — CBC WITH DIFFERENTIAL/PLATELET
BASOS ABS: 0 10*3/uL (ref 0.0–0.1)
Basophils Relative: 0 %
EOS PCT: 5 %
Eosinophils Absolute: 0.4 10*3/uL (ref 0.0–0.7)
HCT: 37.8 % (ref 36.0–46.0)
HEMOGLOBIN: 12.2 g/dL (ref 12.0–15.0)
LYMPHS ABS: 4.1 10*3/uL — AB (ref 0.7–4.0)
LYMPHS PCT: 51 %
MCH: 26.5 pg (ref 26.0–34.0)
MCHC: 32.3 g/dL (ref 30.0–36.0)
MCV: 82.2 fL (ref 78.0–100.0)
Monocytes Absolute: 0.5 10*3/uL (ref 0.1–1.0)
Monocytes Relative: 6 %
Neutro Abs: 3.1 10*3/uL (ref 1.7–7.7)
Neutrophils Relative %: 38 %
Platelets: 341 10*3/uL (ref 150–400)
RBC: 4.6 MIL/uL (ref 3.87–5.11)
RDW: 13.4 % (ref 11.5–15.5)
WBC: 8.1 10*3/uL (ref 4.0–10.5)

## 2015-07-17 LAB — CBG MONITORING, ED: Glucose-Capillary: 158 mg/dL — ABNORMAL HIGH (ref 65–99)

## 2015-07-17 MED ORDER — DIAZEPAM 5 MG PO TABS: 5.0000 mg | ORAL_TABLET | Freq: Two times a day (BID) | ORAL | Status: DC

## 2015-07-17 NOTE — ED Provider Notes (Signed)
CSN: [id number]     Arrival date & time 07/17/15  1208 History  By signing my name below, I, Lyndel Safe, attest that this documentation has been prepared under the direction and in the presence of Shawn Joy, PA-C. Electronically Signed: Lyndel Safe, ED Scribe. 07/17/2015. 4:58 PM.   Chief Complaint  Patient presents with  . jittery    The history is provided by the patient. No language interpreter was used.   HPI Comments: Renee Knapp is a 40 y.o. female, with a h/o DM on metformin, who presents to the Emergency Department complaining of a shaking and 'weird' feeling onset this morning at 9 AM after drinking a new Keurig coffee blend which she has not had before. Pt reports she then ate a pizza pocket and took her metformoin, without relief of symptoms. She notes the jittery feeling has since subsided but that she feels 'not normal' currently with intermittent dizziness occurring while in ED waiting room, but currently denies dizziness.  She has a h/o DM but does not check her CBG at home. No prior history of similar episodes. No new medications or medication changes. Pt denies fever, nausea, vomiting, SOB, chest pain, palpitations, diarrhea, constipation, or recent illness (notes a GI illness ~ 2 weeks ago).     Past Medical History  Diagnosis Date  . Plantar fasciitis, bilateral     History - recvd cortisone injections bilateral- no current prob as 01/2014  . Obesity   . SVD (spontaneous vaginal delivery)     x 2  . Diabetes mellitus without complication (HCC)     diet and exercise controlled - no meds  . Anemia     History   Past Surgical History  Procedure Laterality Date  . Tubal ligation    . Dilation and curettage of uterus      MAB  . Wisdom tooth extraction    . Laparoscopic assisted vaginal hysterectomy N/A 02/24/2014    Procedure: LAPAROSCOPIC ASSISTED VAGINAL HYSTERECTOMY WITH BILATERAL SALPINGECTOMY ;  Surgeon: Essie Hart, MD;  Location: [facility];  Service:  Gynecology;  Laterality: N/A;  . Cystoscopy N/A 02/24/2014    Procedure: CYSTOSCOPY;  Surgeon: Essie Hart, MD;  Location: [facility];  Service: Gynecology;  Laterality: N/A;   Family History  Problem Relation Age of Onset  . Cancer Other   . Hypertension Other   . Diabetes Other    Social History  Substance Use Topics  . Smoking status: Never Smoker   . Smokeless tobacco: Never Used  . Alcohol Use: No   OB History    Gravida Para Term Preterm AB TAB SAB Ectopic Multiple Living   Review of Systems  Constitutional: Negative for fever and chills.       "Feeling weird"  HENT: Negative for congestion and sore throat.   Respiratory: Negative for cough and shortness of breath.   Cardiovascular: Negative for chest pain and palpitations.  Gastrointestinal: Negative for nausea, vomiting, abdominal pain, diarrhea and constipation.  All other systems reviewed and are negative.  Allergies  Bee venom  Home Medications   Prior to Admission medications   Medication Sig Start Date End Date Taking? Authorizing Provider  Chlorphen-Pseudoephed-APAP [facility] FLU/COLD PO) Take by mouth.    Historical Provider, MD  Cholecalciferol (VITAMIN D PO) Take by mouth.    Historical Provider, MD  diazepam (VALIUM) 5 MG tablet Take 1 tablet (5 mg total) by  mouth 2 (two) times daily. 07/17/15   Shawn C Joy, PA-C  EPINEPHrine (EPIPEN) 0.3 mg/0.3 mL SOAJ injection Inject 0.3 mg into the muscle once as needed (for allergic reaction).    Historical Provider, MD  guaiFENesin (MUCINEX) 600 MG 12 hr tablet Take by mouth 2 (two) times daily.    Historical Provider, MD  ibuprofen (ADVIL,MOTRIN) 800 MG tablet Take 1 tablet (800 mg total) by mouth 3 (three) times daily. 05/07/15   Domenick Gong, MD  METFORMIN HCL PO Take by mouth.    Historical Provider, MD  ondansetron (ZOFRAN) 4 MG tablet Take 1 tablet (4 mg total) by mouth every 6 (six) hours. 06/26/15   Hayden Rasmussen, NP  OVER THE COUNTER MEDICATION  "medicine for nausea"    Historical Provider, MD   BP 110/79 mmHg  Pulse 82  Temp(Src) 98.6 F (37 C) (Oral)  Resp 18  SpO2 100%  LMP 02/06/2014 Physical Exam  Constitutional: She is oriented to person, place, and time. She appears well-developed and well-nourished. No distress.  HENT:  Head: Normocephalic.  Eyes: Conjunctivae are normal.  Neck: Normal range of motion. Neck supple.  No signs of thyroid dysfunction.   Cardiovascular: Normal rate, regular rhythm, normal heart sounds and intact distal pulses.   No peripheral edema.   Pulmonary/Chest: Effort normal and breath sounds normal. No respiratory distress. She has no wheezes. She has no rales.  Abdominal: Soft. There is no tenderness.  Musculoskeletal: Normal range of motion.  Full ROM in all extremities.  Lymphadenopathy:    She has no cervical adenopathy.  Neurological: She is alert and oriented to person, place, and time. Coordination normal.  No sensory deficits. Strength 5/5 in all extremities. No gait disturbance. Coordination intact. Cranial nerves III-XII grossly intact. No facial droop.  Skin: Skin is warm and dry.  Psychiatric: She has a normal mood and affect. Her behavior is normal.  Nursing note and vitals reviewed.   ED Course  Procedures  DIAGNOSTIC STUDIES: Oxygen Saturation is 100% on RA, normal by my interpretation.    COORDINATION OF CARE: 4:52 PM Discussed treatment plan which includes to order diagnostic labs with pt. Pt acknowledges and agrees to plan.    Labs Review Labs Reviewed  CBC WITH DIFFERENTIAL/PLATELET - Abnormal; Notable for the following:    Lymphs Abs 4.1 (*)    All other components within normal limits  CBG MONITORING, ED - Abnormal; Notable for the following:    Glucose-Capillary 158 (*)    All other components within normal limits  COMPREHENSIVE METABOLIC PANEL  I have personally reviewed and evaluated these lab results as part of my medical decision-making.   EKG  Interpretation None      MDM   Final diagnoses:  Jittery    Renee Knapp presents with "feeling jittery and weird."  This patient's complaint is rather nonspecific. The feeling of jitteriness could be from the new blender coffee that she tried. Patient has no neuro deficits and no visible tremors. Patient's behavior is normal and maintains good eye contact throughout the interview. Patient should follow-up with her PCP should the symptoms continue.  Filed Vitals:   07/17/15 1222 07/17/15 1425  BP: 153/102 110/79  Pulse: 102 82  Temp: 98.4 F (36.9 C) 98.6 F (37 C)  TempSrc: Oral Oral  Resp: 18 18  SpO2: 99% 100%     I personally performed the services described in this documentation, which was scribed in my presence. The recorded information has been reviewed  and is accurate.  Anselm Pancoast, PA-C 07/17/15 1823  Tilden Fossa, MD 07/18/15 [phone]

## 2015-07-17 NOTE — Discharge Instructions (Signed)
You have been seen today for "jitteriness." Your lab tests showed no abnormalities. Follow up with PCP if symptoms continue. Return to ED should symptoms worsen.

## 2015-07-17 NOTE — ED Notes (Signed)
Pt sts feels jittery today after drinking new type of coffee; pt denies other change

## 2015-08-27 ENCOUNTER — Encounter (HOSPITAL_COMMUNITY): Payer: Self-pay | Admitting: *Deleted

## 2015-08-27 ENCOUNTER — Emergency Department (HOSPITAL_COMMUNITY)
Admission: EM | Admit: 2015-08-27 | Discharge: 2015-08-27 | Disposition: A | Discharge status: Home / Self Care | Payer: Self-pay | Attending: Emergency Medicine | Admitting: Emergency Medicine

## 2015-08-27 ENCOUNTER — Emergency Department (HOSPITAL_COMMUNITY): Payer: Self-pay

## 2015-08-27 DIAGNOSIS — R072 Precordial pain: Secondary | ICD-10-CM | POA: Insufficient documentation

## 2015-08-27 DIAGNOSIS — Z8739 Personal history of other diseases of the musculoskeletal system and connective tissue: Secondary | ICD-10-CM | POA: Insufficient documentation

## 2015-08-27 DIAGNOSIS — Z791 Long term (current) use of non-steroidal anti-inflammatories (NSAID): Secondary | ICD-10-CM | POA: Insufficient documentation

## 2015-08-27 DIAGNOSIS — E119 Type 2 diabetes mellitus without complications: Secondary | ICD-10-CM | POA: Insufficient documentation

## 2015-08-27 DIAGNOSIS — Z862 Personal history of diseases of the blood and blood-forming organs and certain disorders involving the immune mechanism: Secondary | ICD-10-CM | POA: Insufficient documentation

## 2015-08-27 DIAGNOSIS — E669 Obesity, unspecified: Secondary | ICD-10-CM | POA: Insufficient documentation

## 2015-08-27 DIAGNOSIS — Z7984 Long term (current) use of oral hypoglycemic drugs: Secondary | ICD-10-CM | POA: Insufficient documentation

## 2015-08-27 DIAGNOSIS — Z79899 Other long term (current) drug therapy: Secondary | ICD-10-CM | POA: Insufficient documentation

## 2015-08-27 LAB — BASIC METABOLIC PANEL
Anion gap: 12 (ref 5–15)
BUN: 6 mg/dL (ref 6–20)
CALCIUM: 9.2 mg/dL (ref 8.9–10.3)
CO2: 24 mmol/L (ref 22–32)
Chloride: 105 mmol/L (ref 101–111)
Creatinine, Ser: 0.93 mg/dL (ref 0.44–1.00)
GFR calc Af Amer: >60 mL/min (ref >60)
GLUCOSE: 89 mg/dL (ref 65–99)
Potassium: 4.1 mmol/L (ref 3.5–5.1)
Sodium: 141 mmol/L (ref 135–145)

## 2015-08-27 LAB — CBC
HEMATOCRIT: 38.5 % (ref 36.0–46.0)
Hemoglobin: 12.4 g/dL (ref 12.0–15.0)
MCH: 26.5 pg (ref 26.0–34.0)
MCHC: 32.2 g/dL (ref 30.0–36.0)
MCV: 82.3 fL (ref 78.0–100.0)
PLATELETS: 372 10*3/uL (ref 150–400)
RBC: 4.68 MIL/uL (ref 3.87–5.11)
RDW: 13.7 % (ref 11.5–15.5)
WBC: 7.7 10*3/uL (ref 4.0–10.5)

## 2015-08-27 LAB — I-STAT TROPONIN, ED
TROPONIN I, POC: 0.02 ng/mL (ref 0.00–0.08)
TROPONIN I, POC: 0.01 ng/mL (ref 0.00–0.08)

## 2015-08-27 MED ORDER — FAMOTIDINE 20 MG PO TABS
20.0000 mg | ORAL_TABLET | Freq: Once | ORAL | Status: AC
  Administered 2015-08-27: 20 mg via ORAL
  Filled 2015-08-27: qty 1

## 2015-08-27 MED ORDER — ACETAMINOPHEN 325 MG PO TABS
650.0000 mg | ORAL_TABLET | Freq: Once | ORAL | Status: AC
  Administered 2015-08-27: 650 mg via ORAL
  Filled 2015-08-27: qty 2

## 2015-08-27 MED ORDER — GI COCKTAIL ~~LOC~~
30.0000 mL | Freq: Once | ORAL | Status: AC
  Administered 2015-08-27: 30 mL via ORAL
  Filled 2015-08-27: qty 30

## 2015-08-27 NOTE — Discharge Instructions (Signed)
It was our pleasure to provide your ER care today - we hope that you feel better.  Follow up with cardiologist in the coming week - see referral - call office tomorrow to arrange appointment.  You may take tylenol or advil as need for pain.  If gi symptoms/reflux, you may try taking pepcid and maalox as need.   Return to ER if worse, recurrent or persistent chest pain, trouble breathing, other concern.      Nonspecific Chest Pain  Chest pain can be caused by many different conditions. There is always a chance that your pain could be related to something serious, such as a heart attack or a blood clot in your lungs. Chest pain can also be caused by conditions that are not life-threatening. If you have chest pain, it is very important to follow up with your health care provider. CAUSES  Chest pain can be caused by:  Heartburn.  Pneumonia or bronchitis.  Anxiety or stress.  Inflammation around your heart (pericarditis) or lung (pleuritis or pleurisy).  A blood clot in your lung.  A collapsed lung (pneumothorax). It can develop suddenly on its own (spontaneous pneumothorax) or from trauma to the chest.  Shingles infection (varicella-zoster virus).  Heart attack.  Damage to the bones, muscles, and cartilage that make up your chest wall. This can include:  Bruised bones due to injury.  Strained muscles or cartilage due to frequent or repeated coughing or overwork.  Fracture to one or more ribs.  Sore cartilage due to inflammation (costochondritis). RISK FACTORS  Risk factors for chest pain may include:  Activities that increase your risk for trauma or injury to your chest.  Respiratory infections or conditions that cause frequent coughing.  Medical conditions or overeating that can cause heartburn.  Heart disease or family history of heart disease.  Conditions or health behaviors that increase your risk of developing a blood clot.  Having had chicken pox (varicella  zoster). SIGNS AND SYMPTOMS Chest pain can feel like:  Burning or tingling on the surface of your chest or deep in your chest.  Crushing, pressure, aching, or squeezing pain.  Dull or sharp pain that is worse when you move, cough, or take a deep breath.  Pain that is also felt in your back, neck, shoulder, or arm, or pain that spreads to any of these areas. Your chest pain may come and go, or it may stay constant. DIAGNOSIS Lab tests or other studies may be needed to find the cause of your pain. Your health care provider may have you take a test called an ambulatory ECG (electrocardiogram). An ECG records your heartbeat patterns at the time the test is performed. You may also have other tests, such as:  Transthoracic echocardiogram (TTE). During echocardiography, sound waves are used to create a picture of all of the heart structures and to look at how blood flows through your heart.  Transesophageal echocardiogram (TEE).This is a more advanced imaging test that obtains images from inside your body. It allows your health care provider to see your heart in finer detail.  Cardiac monitoring. This allows your health care provider to monitor your heart rate and rhythm in real time.  Holter monitor. This is a portable device that records your heartbeat and can help to diagnose abnormal heartbeats. It allows your health care provider to track your heart activity for several days, if needed.  Stress tests. These can be done through exercise or by taking medicine that makes your heart beat  more quickly.  Blood tests.  Imaging tests. TREATMENT  Your treatment depends on what is causing your chest pain. Treatment may include:  Medicines. These may include:  Acid blockers for heartburn.  Anti-inflammatory medicine.  Pain medicine for inflammatory conditions.  Antibiotic medicine, if an infection is present.  Medicines to dissolve blood clots.  Medicines to treat coronary artery  disease.  Supportive care for conditions that do not require medicines. This may include:  Resting.  Applying heat or cold packs to injured areas.  Limiting activities until pain decreases. HOME CARE INSTRUCTIONS  If you were prescribed an antibiotic medicine, finish it all even if you start to feel better.  Avoid any activities that bring on chest pain.  Do not use any tobacco products, including cigarettes, chewing tobacco, or electronic cigarettes. If you need help quitting, ask your health care provider.  Do not drink alcohol.  Take medicines only as directed by your health care provider.  Keep all follow-up visits as directed by your health care provider. This is important. This includes any further testing if your chest pain does not go away.  If heartburn is the cause for your chest pain, you may be told to keep your head raised (elevated) while sleeping. This reduces the chance that acid will go from your stomach into your esophagus.  Make lifestyle changes as directed by your health care provider. These may include:  Getting regular exercise. Ask your health care provider to suggest some activities that are safe for you.  Eating a heart-healthy diet. A registered dietitian can help you to learn healthy eating options.  Maintaining a healthy weight.  Managing diabetes, if necessary.  Reducing stress. SEEK MEDICAL CARE IF:  Your chest pain does not go away after treatment.  You have a rash with blisters on your chest.  You have a fever. SEEK IMMEDIATE MEDICAL CARE IF:   Your chest pain is worse.  You have an increasing cough, or you cough up blood.  You have severe abdominal pain.  You have severe weakness.  You faint.  You have chills.  You have sudden, unexplained chest discomfort.  You have sudden, unexplained discomfort in your arms, back, neck, or jaw.  You have shortness of breath at any time.  You suddenly start to sweat, or your skin gets  clammy.  You feel nauseous or you vomit.  You suddenly feel light-headed or dizzy.  Your heart begins to beat quickly, or it feels like it is skipping beats. These symptoms may represent a serious problem that is an emergency. Do not wait to see if the symptoms will go away. Get medical help right away. Call your local emergency services (911 in the [place] Do not drive yourself to the hospital.   This information is not intended to replace advice given to you by your health care provider. Make sure you discuss any questions you have with your health care provider.   Document Released: 02/16/2005 Document Revised: 05/30/2014 Document Reviewed: 12/13/2013 Elsevier Interactive Patient Education 2016 Elsevier Inc[employer]     Chest Wall Pain Chest wall pain is pain in or around the bones and muscles of your chest. Sometimes, an injury causes this pain. Sometimes, the cause may not be known. This pain may take several weeks or longer to get better. HOME CARE INSTRUCTIONS  Pay attention to any changes in your symptoms. Take these actions to help with your pain:   Rest as told by your health care provider.   Avoid  activities that cause pain. These include any activities that use your chest muscles or your abdominal and side muscles to lift heavy items.   If directed, apply ice to the painful area:  Put ice in a plastic bag.  Place a towel between your skin and the bag.  Leave the ice on for 20 minutes, 2-3 times per day.  Take over-the-counter and prescription medicines only as told by your health care provider.  Do not use tobacco products, including cigarettes, chewing tobacco, and e-cigarettes. If you need help quitting, ask your health care provider.  Keep all follow-up visits as told by your health care provider. This is important. SEEK MEDICAL CARE IF:  You have a fever.  Your chest pain becomes worse.  You have new symptoms. SEEK IMMEDIATE MEDICAL CARE IF:  You have nausea  or vomiting.  You feel sweaty or light-headed.  You have a cough with phlegm (sputum) or you cough up blood.  You develop shortness of breath.   This information is not intended to replace advice given to you by your health care provider. Make sure you discuss any questions you have with your health care provider.   Document Released: 05/09/2005 Document Revised: 01/28/2015 Document Reviewed: 08/04/2014 Elsevier Interactive Patient Education [employer]

## 2015-08-27 NOTE — ED Notes (Signed)
Pt here via [facility] from work for acute onset, non-radiating sub-sternal chest pain while sitting at desk. EKG unremarkable.  VS stable.  Given 325 asa by colleagues and 2 nitro en-route without relief.  States feels like weights are sitting on her chest.  Denies any recent travel, denies cough.

## 2015-08-27 NOTE — ED Provider Notes (Signed)
CSN: [id number]     Arrival date & time 08/27/15  1444 History   First MD Initiated Contact with Patient 08/27/15 1504     Chief Complaint  Patient presents with  . Chest Pain     (Consider location/radiation/quality/duration/timing/severity/associated sxs/prior Treatment) Patient is a 40 y.o. female presenting with chest pain. The history is provided by the patient.  Chest Pain Associated symptoms: no abdominal pain, no back pain, no cough, no fever, no headache, no shortness of breath and not vomiting   Patient c/o onset midchest pain at work today, at rest. Dull/heav pain midline. No radiation. Nausea. No vomiting. No diaphoresis. No sob. Denies cough or sore throat. No heartburn, denies hx reflux. No chest wall injury or strain.  Was at gym yesterday, and did crunches, but denies pain/injury. Pt denies leg pain or swelling. No pleuritic pain.   No recent trauma,  Immobility, travel, surgery. No hx dvt or pe. No fam hx premature cad. Non smoker.        Past Medical History  Diagnosis Date  . Plantar fasciitis, bilateral     History - recvd cortisone injections bilateral- no current prob as 01/2014  . Obesity   . SVD (spontaneous vaginal delivery)     x 2  . Diabetes mellitus without complication (HCC)     diet and exercise controlled - no meds  . Anemia     History   Past Surgical History  Procedure Laterality Date  . Tubal ligation    . Dilation and curettage of uterus      MAB  . Wisdom tooth extraction    . Laparoscopic assisted vaginal hysterectomy N/A 02/24/2014    Procedure: LAPAROSCOPIC ASSISTED VAGINAL HYSTERECTOMY WITH BILATERAL SALPINGECTOMY ;  Surgeon: Essie HartWalda Pinn, MD;  Location: [facility];  Service: Gynecology;  Laterality: N/A;  . Cystoscopy N/A 02/24/2014    Procedure: CYSTOSCOPY;  Surgeon: Essie HartWalda Pinn, MD;  Location: [facility];  Service: Gynecology;  Laterality: N/A;   Family History  Problem Relation Age of Onset  . Cancer Other   . Hypertension Other   .  Diabetes Other    Social History  Substance Use Topics  . Smoking status: Never Smoker   . Smokeless tobacco: Never Used  . Alcohol Use: No   OB History    Gravida Para Term Preterm AB TAB SAB Ectopic Multiple Living   3    1  1   2      Review of Systems  Constitutional: Negative for fever and chills.  HENT: Negative for sore throat.   Eyes: Negative for redness.  Respiratory: Negative for cough and shortness of breath.   Cardiovascular: Positive for chest pain. Negative for leg swelling.  Gastrointestinal: Negative for vomiting and abdominal pain.  Genitourinary: Negative for flank pain.  Musculoskeletal: Negative for back pain and neck pain.  Skin: Negative for rash.  Neurological: Negative for headaches.  Hematological: Does not bruise/bleed easily.  Psychiatric/Behavioral: Negative for confusion.      Allergies  Bee venom  Home Medications   Prior to Admission medications   Medication Sig Start Date End Date Taking? Authorizing Provider  Chlorphen-Pseudoephed-APAP [facility](THERAFLU FLU/COLD PO) Take by mouth.    Historical Provider, MD  Cholecalciferol (VITAMIN D PO) Take by mouth.    Historical Provider, MD  diazepam (VALIUM) 5 MG tablet Take 1 tablet (5 mg total) by mouth 2 (two) times daily. 07/17/15   Shawn C Joy, PA-C  EPINEPHrine (EPIPEN) 0.3 mg/0.3 mL SOAJ injection Inject  0.3 mg into the muscle once as needed (for allergic reaction).    Historical Provider, MD  guaiFENesin (MUCINEX) 600 MG 12 hr tablet Take by mouth 2 (two) times daily.    Historical Provider, MD  ibuprofen (ADVIL,MOTRIN) 800 MG tablet Take 1 tablet (800 mg total) by mouth 3 (three) times daily. 05/07/15   Domenick Gong, MD  METFORMIN HCL PO Take by mouth.    Historical Provider, MD  ondansetron (ZOFRAN) 4 MG tablet Take 1 tablet (4 mg total) by mouth every 6 (six) hours. 06/26/15   Hayden Rasmussen, NP  OVER THE COUNTER MEDICATION "medicine for nausea"    Historical Provider, MD   BP 131/86 mmHg  Pulse  88  Temp(Src) 98.6 F (37 C) (Oral)  Resp 16  Ht  (1.626 m)  Wt 90.719 kg  BMI 34.31 kg/m2  SpO2 96%  LMP 02/06/2014 Physical Exam  Constitutional: She appears well-developed and well-nourished. No distress.  HENT:  Nose: Nose normal.  Mouth/Throat: Oropharynx is clear and moist.  Eyes: Conjunctivae are normal. No scleral icterus.  Neck: Neck supple. No tracheal deviation present.  Cardiovascular: Normal rate, regular rhythm, normal heart sounds and intact distal pulses.  Exam reveals no gallop and no friction rub.   No murmur heard. Pulmonary/Chest: Effort normal and breath sounds normal. No respiratory distress. She exhibits tenderness.  Abdominal: Soft. Normal appearance and bowel sounds are normal. She exhibits no distension. There is no tenderness.  Musculoskeletal: She exhibits no edema or tenderness.  Neurological: She is alert.  Skin: Skin is warm and dry. No rash noted. She is not diaphoretic.  Psychiatric: She has a normal mood and affect.  Nursing note and vitals reviewed.   ED Course  Procedures (including critical care time) Labs Review   Results for orders placed or performed during the hospital encounter of 08/27/15  Basic metabolic panel  Result Value Ref Range   Sodium 141 135 - 145 mmol/L   Potassium 4.1 3.5 - 5.1 mmol/L   Chloride 105 101 - 111 mmol/L   CO2 24 22 - 32 mmol/L   Glucose, Bld 89 65 - 99 mg/dL   BUN 6 6 - 20 mg/dL   Creatinine, Ser 1.61 0.44 - 1.00 mg/dL   Calcium 9.2 8.9 - 09.6 mg/dL   GFR calc non Af Amer >60 >60 mL/min   GFR calc Af Amer >60 >60 mL/min   Anion gap 12 5 - 15  CBC  Result Value Ref Range   WBC 7.7 4.0 - 10.5 K/uL   RBC 4.68 3.87 - 5.11 MIL/uL   Hemoglobin 12.4 12.0 - 15.0 g/dL   HCT 04.5 40.9 - 81.1 %   MCV 82.3 78.0 - 100.0 fL   MCH 26.5 26.0 - 34.0 pg   MCHC 32.2 30.0 - 36.0 g/dL   RDW 91.4 78.2 - 95.6 %   Platelets 372 150 - 400 K/uL  I-stat troponin, ED (not at [facility])  Result Value Ref Range    Troponin i, poc 0.02 0.00 - 0.08 ng/mL   Comment 3          I-Stat Troponin, ED (not at [facility])  Result Value Ref Range   Troponin i, poc 0.01 0.00 - 0.08 ng/mL   Comment 3           Dg Chest 2 View  08/27/2015  CLINICAL DATA:  Acute onset, nonradiating substernal chest pain EXAM: CHEST  2 VIEW COMPARISON:  05/14/2013 FINDINGS: The heart size and  mediastinal contours are within normal limits. Both lungs are clear. The visualized skeletal structures are unremarkable. IMPRESSION: No active cardiopulmonary disease. Electronically Signed   By: Signa Kell M.D.   On: 08/27/2015 15:32      I have personally reviewed and evaluated these images and lab results as part of my medical decision-making.   EKG Interpretation   Date/Time:  Thursday August 27 2015 14:58:44 EDT Ventricular Rate:  91 PR Interval:  136 QRS Duration: 84 QT Interval:  356 QTC Calculation: 438 R Axis:   38 Text Interpretation:  Sinus rhythm Normal ECG Confirmed by Denton Lank  MD,  Decorian Schuenemann ([id number]) on 08/27/2015 3:08:25 PM      MDM   Iv ns. Labs. Monitor. Ecg.  Reviewed nursing notes and prior charts for additional history.   Initial and repeat trop negative.  pts symptoms atypical, at rest. No recent exertional cp or discomfort, no unusual doe or fatigue.  Heart score 1.   Relief with meds/gi meds.  Recheck pt comfortable. No distress. No sob. No pain.  Will give referral for outpt cardiology follow up, possible outpt stress test.  Pt currently appears stable for d/c.        Cathren Laine, MD 08/27/15 [phone]

## 2015-08-27 NOTE — ED Notes (Signed)
Pt tolerated po - states some increased pressure with water.

## 2015-09-01 ENCOUNTER — Ambulatory Visit (INDEPENDENT_AMBULATORY_CARE_PROVIDER_SITE_OTHER): Payer: BLUE CROSS/BLUE SHIELD | Admitting: Cardiovascular Disease

## 2015-09-01 ENCOUNTER — Encounter: Payer: Self-pay | Admitting: Cardiovascular Disease

## 2015-09-01 VITALS — BP 112/90 | HR 89 | Ht 64.0 in | Wt 199.8 lb

## 2015-09-01 DIAGNOSIS — I319 Disease of pericardium, unspecified: Secondary | ICD-10-CM

## 2015-09-01 DIAGNOSIS — I309 Acute pericarditis, unspecified: Secondary | ICD-10-CM | POA: Insufficient documentation

## 2015-09-01 MED ORDER — COLCHICINE 0.6 MG PO TABS
0.6000 mg | ORAL_TABLET | Freq: Two times a day (BID) | ORAL | Status: DC
Start: 2015-09-01 — End: 2016-11-09

## 2015-09-01 NOTE — Progress Notes (Signed)
Cardiology Office Note   Date:  09/01/2015   ID:  Theador Hawthorneatashi R Dicioccio, DOB 04-16-1976, MRN [id number]  PCP:  Dolan AmenBAILEY, SARAH M, FNP  Cardiologist:   Vesta MixerNahser, Siobhan Zaro J, MD   Chief Complaint  Patient presents with  . Chest Pain   1. Chest pain  2. Diabetes Mellitus  3.    History of Present Illness:  September 01, 2015  Aviva Signsatashi R Melina FiddlerLarkin is a 40 y.o. female who presents for evaluation of an episode of CP. She was seen in the ER with CP on April 6 ECG showed NSR with no ST or T wave changes  CP since Thursday , Heaviness,  Feels like lots of lead gowns on her chest Received a GI cocktail in the ER and the symptoms resolved.  Saw her primary care yesterday .  Labs were drawn.  Looking at pancreas and GB.  Constant since 5 days ago Seems to have a pleuretic component Does not vary with exertion , eating or drinking .  Associated with shortness of breath.   Has to take a breath in the middle of a sentence.   Location: mid sternal , worse with deep breath  Quality: pressure  Duration: 5 days  Timing: constant Associated signs and symptoms: no diaphoresis,   Some shortness of breath  Modifying factors: not relieved with anything     Past Medical History  Diagnosis Date  . Plantar fasciitis, bilateral     History - recvd cortisone injections bilateral- no current prob as 01/2014  . Obesity   . SVD (spontaneous vaginal delivery)     x 2  . Diabetes mellitus without complication (HCC)     diet and exercise controlled - no meds  . Anemia     History    Past Surgical History  Procedure Laterality Date  . Tubal ligation    . Dilation and curettage of uterus      MAB  . Wisdom tooth extraction    . Laparoscopic assisted vaginal hysterectomy N/A 02/24/2014    Procedure: LAPAROSCOPIC ASSISTED VAGINAL HYSTERECTOMY WITH BILATERAL SALPINGECTOMY ;  Surgeon: Essie HartWalda Pinn, MD;  Location: [facility];  Service: Gynecology;  Laterality: N/A;  . Cystoscopy N/A 02/24/2014    Procedure:  CYSTOSCOPY;  Surgeon: Essie HartWalda Pinn, MD;  Location: [facility];  Service: Gynecology;  Laterality: N/A;     Current Outpatient Prescriptions  Medication Sig Dispense Refill  . diazepam (VALIUM) 5 MG tablet Take 1 tablet (5 mg total) by mouth 2 (two) times daily. 5 tablet 0  . EPINEPHrine (EPIPEN) 0.3 mg/0.3 mL SOAJ injection Inject 0.3 mg into the muscle once as needed (for allergic reaction).    Marland Kitchen. ibuprofen (ADVIL,MOTRIN) 800 MG tablet Take 1 tablet (800 mg total) by mouth 3 (three) times daily. 30 tablet 0  . METFORMIN HCL PO Take 500 mg by mouth daily.     . ondansetron (ZOFRAN) 4 MG tablet Take 1 tablet (4 mg total) by mouth every 6 (six) hours. 12 tablet 0  . colchicine 0.6 MG tablet Take 1 tablet (0.6 mg total) by mouth 2 (two) times daily. 30 tablet 0   No current facility-administered medications for this visit.    Allergies:   Bee venom    Social History:  The patient  reports that she has never smoked. She has never used smokeless tobacco. She reports that she does not drink alcohol or use illicit drugs.   Family History:  The patient's family history includes Cancer in her  other; Diabetes in her other; Hypertension in her other.    ROS:  Please see the history of present illness.    Review of Systems: Constitutional:  denies fever, chills, diaphoresis, appetite change and fatigue.  HEENT: denies photophobia, eye pain, redness, hearing loss, ear pain, congestion, sore throat, rhinorrhea, sneezing, neck pain, neck stiffness and tinnitus.  Respiratory: admits to inability to take a deep breath   Cardiovascular: admits to constant chest pain,   Gastrointestinal: denies nausea, vomiting, abdominal pain, diarrhea, constipation, blood in stool.  Genitourinary: denies dysuria, urgency, frequency, hematuria, flank pain and difficulty urinating.  Musculoskeletal: denies  myalgias, back pain, joint swelling, arthralgias and gait problem.   Skin: denies pallor, rash and wound.    Neurological: denies dizziness, seizures, syncope, weakness, light-headedness, numbness and headaches.   Hematological: denies adenopathy, easy bruising, personal or family bleeding history.  Psychiatric/ Behavioral: denies suicidal ideation, mood changes, confusion, nervousness, sleep disturbance and agitation.       All other systems are reviewed and negative.    PHYSICAL EXAM: VS:  BP 112/90 mmHg  Pulse 89  Ht  (1.626 m)  Wt 199 lb 12.8 oz (90.629 kg)  BMI 34.28 kg/m2  SpO2 98%  LMP 02/06/2014 , BMI Body mass index is 34.28 kg/(m^2). GEN: Well nourished, well developed, in no acute distress HEENT: normal Neck: no JVD, carotid bruits, or masses Cardiac: RRR; no murmurs, rubs, or gallops,no edema , no chest wall tenderness Respiratory:  clear to auscultation bilaterally, normal work of breathing GI: soft, nontender, nondistended, + BS MS: no deformity or atrophy Skin: warm and dry, no rash Neuro:  Strength and sensation are intact Psych: normal   EKG:  EKG is ordered today. The ekg ordered April 6 demonstrates NSR at 53  with no ST or T wave changes ECG September 01, 2015 - NSR at 85.   Very slight PR depression with slight ST elevation - ? Pericarditis .   Recent Labs: 07/17/2015: ALT 15 08/27/2015: BUN 6; Creatinine, Ser 0.93; Hemoglobin 12.4; Platelets 372; Potassium 4.1; Sodium 141    Lipid Panel No results found for: CHOL, TRIG, HDL, CHOLHDL, VLDL, LDLCALC, LDLDIRECT    Wt Readings from Last 3 Encounters:  09/01/15 199 lb 12.8 oz (90.629 kg)  08/27/15 200 lb (90.719 kg)  02/25/14 207 lb (93.895 kg)      Other studies Reviewed: Additional studies/ records that were reviewed today include: . Review of the above records demonstrates:    ASSESSMENT AND PLAN:  1.  Chest pain :  She presents with pleuretic CP - c/w either pericarditis or pleurisy.  She does have slight PR depression on her ECG today with ? Slight ST elevation . Will give her Colchicine 0.6  BID for 15 days, no refills. Will have her start ibuprofen 400 TID for 2-3 weeks .  Current medicines are reviewed at length with the patient today.  The patient does not have concerns regarding medicines.  The following changes have been made:  no change  Labs/ tests ordered today include:   Orders Placed This Encounter  Procedures  . EKG 12-Lead     Disposition:   FU with me in 3 months      Noga Fogg, Deloris Ping, MD  09/01/2015 12:28 PM    [facility] [address] [place], [place], [place]  [address] Phone: [phone]; Fax: [phone]   [facility]  [address] [place], [place]  [address] [phone]  Fax [phone]

## 2015-09-01 NOTE — Patient Instructions (Signed)
Medication Instructions:  Your physician has recommended you make the following change in your medication:  1) START Colchicine 0.6mg  twice daily for 2 weeks an Rx has been sent to your pharmacy 2) TAKE OTC Ibuprofen 400mg  3 time daily with food for 2-3 weeks   Labwork: None ordered  Testing/Procedures: None ordered  Follow-Up: Your physician recommends that you schedule a follow-up appointment in: 3 months with Dr.Nahser   Any Other Special Instructions Will Be Listed Below (If Applicable).     If you need a refill on your cardiac medications before your next appointment, please call your pharmacy.

## 2015-12-01 ENCOUNTER — Ambulatory Visit: Payer: Self-pay | Admitting: Cardiovascular Disease

## 2015-12-01 DIAGNOSIS — R0989 Other specified symptoms and signs involving the circulatory and respiratory systems: Secondary | ICD-10-CM

## 2015-12-09 ENCOUNTER — Encounter: Payer: Self-pay | Admitting: Cardiovascular Disease

## 2016-11-09 ENCOUNTER — Ambulatory Visit (HOSPITAL_COMMUNITY)
Admission: EM | Admit: 2016-11-09 | Discharge: 2016-11-09 | Disposition: A | Discharge status: Home / Self Care | Payer: BLUE CROSS/BLUE SHIELD | Attending: Family Medicine | Admitting: Family Medicine

## 2016-11-09 ENCOUNTER — Encounter (HOSPITAL_COMMUNITY): Payer: Self-pay | Admitting: Emergency Medicine

## 2016-11-09 DIAGNOSIS — S93401A Sprain of unspecified ligament of right ankle, initial encounter: Secondary | ICD-10-CM

## 2016-11-09 DIAGNOSIS — M25571 Pain in right ankle and joints of right foot: Secondary | ICD-10-CM

## 2016-11-09 MED ORDER — NAPROXEN 500 MG PO TABS: 500.0000 mg | ORAL_TABLET | Freq: Two times a day (BID) | ORAL | 0 refills | Status: DC

## 2016-11-09 NOTE — ED Provider Notes (Signed)
CSN: [id number]     Arrival date & time 11/09/16  1925 History   None    Chief Complaint  Patient presents with  . Ankle Pain   (Consider location/radiation/quality/duration/timing/severity/associated sxs/prior Treatment) 41 yo female comes in with right ankle pain after inverting ankle today. She states she was stepping off of a curb and inverted her ankle. Pain was not bad then, and was able to walk and drive home. After a few hours, pain worsened with swelling and throbbing and came in for evaluation. Has not tried any medications. Movement and weight bearing makes pain worse. Denies numbness or tingling. Denies redness, increased warmth, fever.   Patient with history of Diabetes mellitus that is well controlled. Patient states last a1c is 6. Denies neuropathy of the feet. Denies kidney disease.       Past Medical History:  Diagnosis Date  . Anemia    History  . Diabetes mellitus without complication (HCC)    diet and exercise controlled - no meds  . Obesity   . Plantar fasciitis, bilateral    History - recvd cortisone injections bilateral- no current prob as 01/2014  . SVD (spontaneous vaginal delivery)    x 2   Past Surgical History:  Procedure Laterality Date  . CYSTOSCOPY N/A 02/24/2014   Procedure: CYSTOSCOPY;  Surgeon: Essie HartWalda Pinn, MD;  Location: [facility];  Service: Gynecology;  Laterality: N/A;  . DILATION AND CURETTAGE OF UTERUS     MAB  . LAPAROSCOPIC ASSISTED VAGINAL HYSTERECTOMY N/A 02/24/2014   Procedure: LAPAROSCOPIC ASSISTED VAGINAL HYSTERECTOMY WITH BILATERAL SALPINGECTOMY ;  Surgeon: Essie HartWalda Pinn, MD;  Location: [facility];  Service: Gynecology;  Laterality: N/A;  . TUBAL LIGATION    . WISDOM TOOTH EXTRACTION     Family History  Problem Relation Age of Onset  . Cancer Other   . Hypertension Other   . Diabetes Other    Social History  Substance Use Topics  . Smoking status: Never Smoker  . Smokeless tobacco: Never Used  . Alcohol use No   OB History     Gravida Para Term Preterm AB Living   3       1 2    SAB TAB Ectopic Multiple Live Births   1             Review of Systems  Constitutional: Negative for fever.  Respiratory: Negative for chest tightness, shortness of breath and wheezing.   Cardiovascular: Negative for chest pain and palpitations.  Musculoskeletal: Positive for arthralgias, joint swelling and myalgias.  Neurological: Negative for dizziness, syncope, weakness, light-headedness and numbness.    Allergies  Bee venom  Home Medications   Prior to Admission medications   Medication Sig Start Date End Date Taking? Authorizing Provider  hydrochlorothiazide (HYDRODIURIL) 25 MG tablet Take 25 mg by mouth daily.   Yes [provider]  METFORMIN HCL PO Take 500 mg by mouth daily.    Yes [provider]  EPINEPHrine (EPIPEN) 0.3 mg/0.3 mL SOAJ injection Inject 0.3 mg into the muscle once as needed (for allergic reaction).    [provider]  naproxen (NAPROSYN) 500 MG tablet Take 1 tablet (500 mg total) by mouth 2 (two) times daily. 11/09/16   Belinda FisherYu, Lawernce Earll V, PA-C   Meds Ordered and Administered this Visit  Medications - No data to display  BP 124/73 (BP Location: Left Arm)   Pulse (!) 102   Temp 98.8 F (37.1 C) (Oral)   Resp 20   LMP 02/06/2014  SpO2 100%  No data found.   Physical Exam  Constitutional: She is oriented to person, place, and time. She appears well-developed and well-nourished. No distress.  HENT:  Head: Normocephalic and atraumatic.  Eyes: Conjunctivae are normal. Pupils are equal, round, and reactive to light.  Musculoskeletal:       Left ankle: Normal.  Right ankle: Swelling around ankle. No ecchymosis noted. Tender on palpation of the ankle especially at the anterior aspect. No bony tenderness of the lateral or medial malleolus. Full ROM. Strength normal and equal bilaterally. Pedal pulses 2+ and equal bilaterally.   Patient limping due to pain, but able to bear weight.    Neurological: She is alert and oriented to person, place, and time.  Skin: Skin is warm and dry. No abrasion, no bruising and no laceration noted.  Psychiatric: She has a normal mood and affect. Her behavior is normal. Judgment normal.    Urgent Care Course     Procedures (including critical care time)  Labs Review Labs Reviewed - No data to display  Imaging Review No results found.        MDM   1. Sprain of right ankle, unspecified ligament, initial encounter   2. Acute right ankle pain    1. Discussed with patient history and exam most consistent with sprain of ankle. Discussed due to being able to bear weight and walk, low suspicion for fractures right now. Start Naproxen 500mg  BID x 10 days. Side effects of medication discussed with patient. Patient to ice and rest ankle. Ace wrap applied at appointment. Patient to continue compression during activity. Discussed with patient may take a few weeks for pain to completely resolve. 2. Patient to monitor for worsening of symptoms such as fever, joint erythema, increased warmth, increasing pain. Reevaluate as needed.    Belinda Fisher, PA-C 11/09/16 2017

## 2016-11-09 NOTE — ED Triage Notes (Signed)
[facility] a [facility] of right ankle pain and swelling that started today secondary to stepping off of a curb earlier today.

## 2016-12-07 ENCOUNTER — Encounter (HOSPITAL_COMMUNITY): Payer: Self-pay | Admitting: Emergency Medicine

## 2016-12-07 ENCOUNTER — Emergency Department (HOSPITAL_COMMUNITY)
Admission: EM | Admit: 2016-12-07 | Discharge: 2016-12-07 | Disposition: A | Discharge status: Home / Self Care | Payer: Medicaid Other | Attending: Emergency Medicine | Admitting: Emergency Medicine

## 2016-12-07 DIAGNOSIS — Z79899 Other long term (current) drug therapy: Secondary | ICD-10-CM | POA: Diagnosis not present

## 2016-12-07 DIAGNOSIS — Z7984 Long term (current) use of oral hypoglycemic drugs: Secondary | ICD-10-CM | POA: Diagnosis not present

## 2016-12-07 DIAGNOSIS — L509 Urticaria, unspecified: Secondary | ICD-10-CM | POA: Diagnosis not present

## 2016-12-07 DIAGNOSIS — E119 Type 2 diabetes mellitus without complications: Secondary | ICD-10-CM | POA: Insufficient documentation

## 2016-12-07 DIAGNOSIS — L299 Pruritus, unspecified: Secondary | ICD-10-CM | POA: Diagnosis present

## 2016-12-07 MED ORDER — DIPHENHYDRAMINE HCL 25 MG PO CAPS: 25.0000 mg | ORAL_CAPSULE | Freq: Four times a day (QID) | ORAL | 0 refills | Status: DC | PRN

## 2016-12-07 MED ORDER — HYDROXYZINE HCL 10 MG PO TABS: 10.0000 mg | ORAL_TABLET | Freq: Four times a day (QID) | ORAL | 0 refills | Status: DC | PRN

## 2016-12-07 MED ORDER — PREDNISONE 20 MG PO TABS: 40.0000 mg | ORAL_TABLET | Freq: Every day | ORAL | 0 refills | Status: DC

## 2016-12-07 NOTE — ED Provider Notes (Signed)
[facility]-EMERGENCY DEPT Provider Note   CSN: [id number] Arrival date & time: 12/07/16  0004     History   Chief Complaint Chief Complaint  Patient presents with  . Allergic Reaction    HPI Renee Knapp is a 41 y.o. female.  HPI   Patient is a 41 year old female with history of diabetes who presents the ED with complaint of allergic reaction, onset 7 PM. Patient states while she was holding her laundry at home she began having a gradually worsening burning/itching sensation to her face with associated redness. She states her up at night her symptoms gradually worsen and have remained constant. Denies taking any medications prior to arrival. Denies any known triggers. Patient denies use of any new soaps, lotions, detergents, face wash, makeup, medications or foods. Denies fever, facial swelling, swelling of lips/timing, sensation of throat closing, shortness of breath, wheezing, abdominal pain, nausea, vomiting, rash.   Past Medical History:  Diagnosis Date  . Anemia    History  . Diabetes mellitus without complication (HCC)    diet and exercise controlled - no meds  . Obesity   . Plantar fasciitis, bilateral    History - recvd cortisone injections bilateral- no current prob as 01/2014  . SVD (spontaneous vaginal delivery)    x 2    Patient Active Problem List   Diagnosis Date Noted  . Acute pericarditis 09/01/2015  . S/P laparoscopic assisted vaginal hysterectomy (LAVH) 02/24/2014    Past Surgical History:  Procedure Laterality Date  . CYSTOSCOPY N/A 02/24/2014   Procedure: CYSTOSCOPY;  Surgeon: Essie Hart, MD;  Location: [facility];  Service: Gynecology;  Laterality: N/A;  . DILATION AND CURETTAGE OF UTERUS     MAB  . LAPAROSCOPIC ASSISTED VAGINAL HYSTERECTOMY N/A 02/24/2014   Procedure: LAPAROSCOPIC ASSISTED VAGINAL HYSTERECTOMY WITH BILATERAL SALPINGECTOMY ;  Surgeon: Essie Hart, MD;  Location: [facility];  Service: Gynecology;  Laterality: N/A;  . TUBAL LIGATION    . WISDOM  TOOTH EXTRACTION      OB History    Gravida Para Term Preterm AB Living   3       1 2    SAB TAB Ectopic Multiple Live Births   1               Home Medications    Prior to Admission medications   Medication Sig Start Date End Date Taking? Authorizing Provider  diphenhydrAMINE (BENADRYL) 25 mg capsule Take 1 capsule (25 mg total) by mouth every 6 (six) hours as needed. 12/07/16   Barrett Henle, PA-C  EPINEPHrine (EPIPEN) 0.3 mg/0.3 mL SOAJ injection Inject 0.3 mg into the muscle once as needed (for allergic reaction).    [provider]  hydrochlorothiazide (HYDRODIURIL) 25 MG tablet Take 25 mg by mouth daily.    [provider]  hydrOXYzine (ATARAX/VISTARIL) 10 MG tablet Take 1 tablet (10 mg total) by mouth every 6 (six) hours as needed for itching. 12/07/16   Barrett Henle, PA-C  METFORMIN HCL PO Take 500 mg by mouth daily.     [provider]  naproxen (NAPROSYN) 500 MG tablet Take 1 tablet (500 mg total) by mouth 2 (two) times daily. 11/09/16   Cathie Hoops, Amy V, PA-C  predniSONE (DELTASONE) 20 MG tablet Take 2 tablets (40 mg total) by mouth daily. 12/07/16   Barrett Henle, PA-C    Family History Family History  Problem Relation Age of Onset  . Cancer Other   . Hypertension Other   .  Diabetes Other     Social History Social History  Substance Use Topics  . Smoking status: Never Smoker  . Smokeless tobacco: Never Used  . Alcohol use No     Allergies   Bee venom   Review of Systems Review of Systems  Skin: Positive for color change (redness).       itching  All other systems reviewed and are negative.    Physical Exam Updated Vital Signs BP 126/74 (BP Location: Left Arm)   Pulse 89   Temp 98.6 F (37 C) (Oral)   Resp 16   Ht 5\' 4"  (1.626 m)   Wt 90.7 kg (200 lb)   LMP 02/06/2014   SpO2 99%   BMI 34.33 kg/m   Physical Exam  Constitutional: She is oriented to person, place, and time. She appears  well-developed and well-nourished. No distress.  HENT:  Head: Normocephalic and atraumatic.  Mouth/Throat: Uvula is midline, oropharynx is clear and moist and mucous membranes are normal. No oropharyngeal exudate, posterior oropharyngeal edema, posterior oropharyngeal erythema or tonsillar abscesses. No tonsillar exudate.  No trismus, drooling, facial/neck swelling or stridor on exam. No muffled voice. Floor of mouth soft.    Eyes: Pupils are equal, round, and reactive to light. Conjunctivae and EOM are normal. Right eye exhibits no discharge. Left eye exhibits no discharge. No scleral icterus.  Neck: Normal range of motion. Neck supple.  Cardiovascular: Normal rate, regular rhythm, normal heart sounds and intact distal pulses.   Pulmonary/Chest: Effort normal and breath sounds normal. No respiratory distress. She has no wheezes. She has no rales. She exhibits no tenderness.  Abdominal: Soft. Bowel sounds are normal. She exhibits no distension and no mass. There is no tenderness. There is no rebound and no guarding.  Musculoskeletal: She exhibits no edema.  Neurological: She is alert and oriented to person, place, and time.  Skin: Skin is warm and dry. Capillary refill takes less than 2 seconds. Rash noted. She is not diaphoretic.  Few small erythematous papules present to forehead and facial cheeks; no vesicles, pustules, bulla or drainage present. No lesions on palms or soles. No rash to rest of body noted.   Nursing note and vitals reviewed.    ED Treatments / Results  Labs (all labs ordered are listed, but only abnormal results are displayed) Labs Reviewed - No data to display  EKG  EKG Interpretation None       Radiology No results found.  Procedures Procedures (including critical care time)  Medications Ordered in ED Medications - No data to display   Initial Impression / Assessment and Plan / ED Course  I have reviewed the triage vital signs and the nursing  notes.  Pertinent labs & imaging results that were available during my care of the patient were reviewed by me and considered in my medical decision making (see chart for details).     Rash consistent with mild urticaria. Patient denies any difficulty breathing or swallowing.  Pt has a patent airway without stridor and is handling secretions without difficulty; no angioedema. No blisters, no pustules, no warmth, no draining sinus tracts, no superficial abscesses, no bullous impetigo, no vesicles, no desquamation, no target lesions with dusky purpura or a central bulla. Not tender to touch. No concern for superimposed infection. No concern for SJS, TEN, TSS, tick borne illness, syphilis or other life-threatening condition. Will discharge home with short course of steroids, vistaril and recommend Benadryl as needed for pruritis. Discussed return precautions.  Final Clinical Impressions(s) / ED Diagnoses   Final diagnoses:  Urticaria    New Prescriptions New Prescriptions   DIPHENHYDRAMINE (BENADRYL) 25 MG CAPSULE    Take 1 capsule (25 mg total) by mouth every 6 (six) hours as needed.   HYDROXYZINE (ATARAX/VISTARIL) 10 MG TABLET    Take 1 tablet (10 mg total) by mouth every 6 (six) hours as needed for itching.   PREDNISONE (DELTASONE) 20 MG TABLET    Take 2 tablets (40 mg total) by mouth daily.     Barrett Henle, PA-C 12/07/16 0455    Ward, Layla Maw, DO 12/07/16 [phone]

## 2016-12-07 NOTE — Discharge Instructions (Signed)
Take your medication as prescribed as needed for itching and your rash. Take Benadryl as needed for additional relief. Follow-up with your primary care provider in the next 2-3 days ago symptoms have not improved. Return to the emergency department if symptoms worsen or new onset of fever, facial/neck swelling, swelling of lips/tongue, difficulty breathing, vomiting, new rash.

## 2016-12-07 NOTE — ED Triage Notes (Signed)
Pt c/o burning and itching to forehead and cheeks.  Onset this pm.  No other comnplaints

## 2017-02-27 ENCOUNTER — Emergency Department (HOSPITAL_COMMUNITY): Payer: Medicaid Other

## 2017-02-27 ENCOUNTER — Encounter (HOSPITAL_COMMUNITY): Payer: Self-pay

## 2017-02-27 ENCOUNTER — Emergency Department (HOSPITAL_COMMUNITY)
Admission: EM | Admit: 2017-02-27 | Discharge: 2017-02-27 | Disposition: A | Discharge status: Home / Self Care | Payer: Medicaid Other | Attending: Emergency Medicine | Admitting: Emergency Medicine

## 2017-02-27 DIAGNOSIS — Z79899 Other long term (current) drug therapy: Secondary | ICD-10-CM | POA: Insufficient documentation

## 2017-02-27 DIAGNOSIS — E119 Type 2 diabetes mellitus without complications: Secondary | ICD-10-CM | POA: Diagnosis not present

## 2017-02-27 DIAGNOSIS — R202 Paresthesia of skin: Secondary | ICD-10-CM | POA: Insufficient documentation

## 2017-02-27 DIAGNOSIS — R079 Chest pain, unspecified: Secondary | ICD-10-CM | POA: Diagnosis present

## 2017-02-27 DIAGNOSIS — D649 Anemia, unspecified: Secondary | ICD-10-CM | POA: Diagnosis not present

## 2017-02-27 DIAGNOSIS — Z7984 Long term (current) use of oral hypoglycemic drugs: Secondary | ICD-10-CM | POA: Insufficient documentation

## 2017-02-27 DIAGNOSIS — R072 Precordial pain: Secondary | ICD-10-CM | POA: Insufficient documentation

## 2017-02-27 LAB — CBC
HEMATOCRIT: 41.4 % (ref 36.0–46.0)
HEMOGLOBIN: 13.5 g/dL (ref 12.0–15.0)
MCH: 26.8 pg (ref 26.0–34.0)
MCHC: 32.6 g/dL (ref 30.0–36.0)
MCV: 82.1 fL (ref 78.0–100.0)
PLATELETS: 359 10*3/uL (ref 150–400)
RBC: 5.04 MIL/uL (ref 3.87–5.11)
RDW: 13.5 % (ref 11.5–15.5)
WBC: 8.9 10*3/uL (ref 4.0–10.5)

## 2017-02-27 LAB — BASIC METABOLIC PANEL
ANION GAP: 13 (ref 5–15)
BUN: 9 mg/dL (ref 6–20)
CHLORIDE: 101 mmol/L (ref 101–111)
CO2: 23 mmol/L (ref 22–32)
CREATININE: 0.85 mg/dL (ref 0.44–1.00)
Calcium: 9.3 mg/dL (ref 8.9–10.3)
GFR calc non Af Amer: >60 mL/min (ref >60)
Glucose, Bld: 122 mg/dL — ABNORMAL HIGH (ref 65–99)
POTASSIUM: 4.2 mmol/L (ref 3.5–5.1)
SODIUM: 137 mmol/L (ref 135–145)

## 2017-02-27 LAB — POCT PREGNANCY, URINE: PREG TEST UR: NEGATIVE

## 2017-02-27 LAB — I-STAT TROPONIN, ED
TROPONIN I, POC: 0 ng/mL (ref 0.00–0.08)
Troponin i, poc: 0.01 ng/mL (ref 0.00–0.08)

## 2017-02-27 MED ORDER — IOPAMIDOL (ISOVUE-370) INJECTION 76%
INTRAVENOUS | Status: AC
  Administered 2017-02-27: 100 mL
  Filled 2017-02-27: qty 100

## 2017-02-27 MED ORDER — ASPIRIN 81 MG PO CHEW
324.0000 mg | CHEWABLE_TABLET | Freq: Once | ORAL | Status: AC
  Administered 2017-02-27: 324 mg via ORAL
  Filled 2017-02-27: qty 4

## 2017-02-27 NOTE — ED Triage Notes (Signed)
Pt reports central chest pain that woke her this morning. She reports radiation to the right arm. Skin warm and dry. No distress noted. She reports hx of fluid around her heart.

## 2017-02-27 NOTE — ED Notes (Signed)
On way to CT 

## 2017-02-27 NOTE — ED Provider Notes (Signed)
[facility] Provider Note   CSN: [id number] Arrival date & time: 02/27/17  7829     History   Chief Complaint Chief Complaint  Patient presents with  . Chest Pain    HPI Renee Knapp is a 41 y.o. female.  Renee Knapp is a 41 y.o. Female who presents to the ED complaining of chest pain with onset last night. Patient reports she began having some substernal and nonradiating chest pain last night before she went to bed. She reports she will begin her on 5:30 this morning with substernal chest pain. She reports she developed associated numbness and tingling to her right upper extremity that extends into her right third finger. She reports this seems to come and go. She denies any neck or back pain or weakness. Her chest pain has been constant since she awoke at 5:30 this morning. She does report some slight shortness of breath earlier, but none currently. She denies any dyspnea on exertion. She has a history of pericarditis back in 2013. She denies fevers, coughing, current shortness of breath, palpitations, leg pain, leg swelling, abdominal pain, nausea, vomiting, weakness or rashes.   The history is provided by the patient and medical records. No language interpreter was used.  Chest Pain   Associated symptoms include numbness and shortness of breath (resolved ). Pertinent negatives include no abdominal pain, no back pain, no cough, no dizziness, no fever, no headaches, no nausea, no palpitations, no vomiting and no weakness.    Past Medical History:  Diagnosis Date  . Anemia    History  . Diabetes mellitus without complication (HCC)    diet and exercise controlled - no meds  . Obesity   . Plantar fasciitis, bilateral    History - recvd cortisone injections bilateral- no current prob as 01/2014  . SVD (spontaneous vaginal delivery)    x 2    Patient Active Problem List   Diagnosis Date Noted  . Acute pericarditis 09/01/2015  . S/P laparoscopic assisted vaginal  hysterectomy (LAVH) 02/24/2014    Past Surgical History:  Procedure Laterality Date  . CYSTOSCOPY N/A 02/24/2014   Procedure: CYSTOSCOPY;  Surgeon: Essie Hart, MD;  Location: [facility];  Service: Gynecology;  Laterality: N/A;  . DILATION AND CURETTAGE OF UTERUS     MAB  . LAPAROSCOPIC ASSISTED VAGINAL HYSTERECTOMY N/A 02/24/2014   Procedure: LAPAROSCOPIC ASSISTED VAGINAL HYSTERECTOMY WITH BILATERAL SALPINGECTOMY ;  Surgeon: Essie Hart, MD;  Location: [facility];  Service: Gynecology;  Laterality: N/A;  . TUBAL LIGATION    . WISDOM TOOTH EXTRACTION      OB History    Gravida Para Term Preterm AB Living   SAB TAB Ectopic Multiple Live Births   1               Home Medications    Prior to Admission medications   Medication Sig Start Date End Date Taking? Authorizing Provider  diphenhydrAMINE (BENADRYL) 25 mg capsule Take 1 capsule (25 mg total) by mouth every 6 (six) hours as needed. 12/07/16   Barrett Henle, PA-C  EPINEPHrine (EPIPEN) 0.3 mg/0.3 mL SOAJ injection Inject 0.3 mg into the muscle once as needed (for allergic reaction).    [provider]  hydrochlorothiazide (HYDRODIURIL) 25 MG tablet Take 25 mg by mouth daily.    [provider]  hydrOXYzine (ATARAX/VISTARIL) 10 MG tablet Take 1 tablet (10 mg total) by mouth every 6 (six)  hours as needed for itching. 12/07/16   Barrett Henle, PA-C  METFORMIN HCL PO Take 500 mg by mouth daily.     [provider]  naproxen (NAPROSYN) 500 MG tablet Take 1 tablet (500 mg total) by mouth 2 (two) times daily. 11/09/16   Cathie Hoops, Amy V, PA-C  predniSONE (DELTASONE) 20 MG tablet Take 2 tablets (40 mg total) by mouth daily. 12/07/16   Barrett Henle, PA-C    Family History Family History  Problem Relation Age of Onset  . Cancer Other   . Hypertension Other   . Diabetes Other     Social History Social History  Substance Use Topics  . Smoking status: Never Smoker  . Smokeless  tobacco: Never Used  . Alcohol use No     Allergies   Bee venom   Review of Systems Review of Systems  Constitutional: Negative for chills and fever.  HENT: Negative for congestion and sore throat.   Eyes: Negative for visual disturbance.  Respiratory: Positive for shortness of breath (resolved ). Negative for cough and wheezing.   Cardiovascular: Positive for chest pain. Negative for palpitations and leg swelling.  Gastrointestinal: Negative for abdominal pain, diarrhea, nausea and vomiting.  Genitourinary: Negative for dysuria.  Musculoskeletal: Negative for back pain and neck pain.  Skin: Negative for rash.  Neurological: Positive for numbness. Negative for dizziness, syncope, weakness, light-headedness and headaches.     Physical Exam Updated Vital Signs BP 134/81   Pulse 78   Temp 98.1 F (36.7 C) (Oral)   Resp 17   LMP 02/06/2014   SpO2 99%   Physical Exam  Constitutional: She is oriented to person, place, and time. She appears well-developed and well-nourished. No distress.  Nontoxic appearing.  HENT:  Head: Normocephalic and atraumatic.  Mouth/Throat: Oropharynx is clear and moist.  Eyes: Pupils are equal, round, and reactive to light. Conjunctivae are normal. Right eye exhibits no discharge. Left eye exhibits no discharge.  Neck: Normal range of motion. Neck supple. No JVD present. No tracheal deviation present.  Cardiovascular: Normal rate, regular rhythm, normal heart sounds and intact distal pulses.  Exam reveals no gallop and no friction rub.   No murmur heard. Bilateral radial, posterior tibialis and dorsalis pedis pulses are intact.    Pulmonary/Chest: Effort normal and breath sounds normal. No stridor. No respiratory distress. She has no wheezes. She has no rales.  Lungs are clear to ascultation bilaterally. Symmetric chest expansion bilaterally. No increased work of breathing. No rales or rhonchi.    Abdominal: Soft. There is no tenderness. There is no  guarding.  Musculoskeletal: Normal range of motion. She exhibits no edema or tenderness.  No LE edema or TTP.   Lymphadenopathy:    She has no cervical adenopathy.  Neurological: She is alert and oriented to person, place, and time. No cranial nerve deficit. She exhibits normal muscle tone. Coordination normal.  Patient is alert and oriented 3. Speech is clear and coherent. Cranial nerves are intact. She reports decreased sensation to her right UE compared to her left. Good and equal grip strengths bilaterally. No pronator drift. Normal strength and sensation to bilateral LEs.   Skin: Skin is warm and dry. Capillary refill takes less than 2 seconds. No rash noted. She is not diaphoretic. No erythema. No pallor.  Psychiatric: She has a normal mood and affect. Her behavior is normal.  Nursing note and vitals reviewed.    ED Treatments / Results  Labs (all labs ordered are  listed, but only abnormal results are displayed) Labs Reviewed  BASIC METABOLIC PANEL - Abnormal; Notable for the following:       Result Value   Glucose, Bld 122 (*)    All other components within normal limits  CBC  I-STAT TROPONIN, ED  POC URINE PREG, ED  I-STAT TROPONIN, ED  POCT PREGNANCY, URINE    EKG  EKG Interpretation  Date/Time:  Monday February 27 2017 09:35:31 EDT Ventricular Rate:  76 PR Interval:  146 QRS Duration: 76 QT Interval:  374 QTC Calculation: 420 R Axis:   61 Text Interpretation:  Normal sinus rhythm Normal ECG No significant change since last tracing Confirmed by Shaune Pollack [phone]) on 02/27/2017 1:08:14 PM       Radiology Dg Chest 2 View  Result Date: 02/27/2017 CLINICAL DATA:  O awakened with central chest pain radiating into the right arm this morning. History of fluid around the heart. EXAM: CHEST  2 VIEW COMPARISON:  Chest x-ray of August 27, 2015 FINDINGS: The lungs are adequately inflated. There is no focal infiltrate. There is no pleural effusion. The heart and pulmonary  vascularity are normal. The mediastinum is normal in width. The bony thorax exhibits no acute abnormality. IMPRESSION: There is no active cardiopulmonary disease. Electronically Signed   By: David  [place] M.D.   On: 02/27/2017 10:23   Ct Angio Chest/abd/pel For Dissection W And/or W/wo  Result Date: 02/27/2017 CLINICAL DATA:  Acute chest pain and right upper extremity numbness. EXAM: CT ANGIOGRAPHY CHEST, ABDOMEN AND PELVIS TECHNIQUE: Multidetector CT imaging through the chest, abdomen and pelvis was performed using the standard protocol during bolus administration of intravenous contrast. Multiplanar reconstructed images and MIPs were obtained and reviewed to evaluate the vascular anatomy. CONTRAST:  100 mL of Isovue 370 intravenously. COMPARISON:  CT scan of January 15, 2014. FINDINGS: CTA CHEST FINDINGS Cardiovascular: Preferential opacification of the thoracic aorta. No evidence of thoracic aortic aneurysm or dissection. Normal heart size. No pericardial effusion. Visualized great vessels are widely patent without stenosis. Mediastinum/Nodes: No enlarged mediastinal, hilar, or axillary lymph nodes. Thyroid gland, trachea, and esophagus demonstrate no significant findings. Lungs/Pleura: Lungs are clear. No pleural effusion or pneumothorax. Musculoskeletal: No chest wall abnormality. No acute or significant osseous findings. Review of the MIP images confirms the above findings. CTA ABDOMEN AND PELVIS FINDINGS VASCULAR Aorta: Normal caliber aorta without aneurysm, dissection, vasculitis or significant stenosis. Celiac: Patent without evidence of aneurysm, dissection, vasculitis or significant stenosis. SMA: Patent without evidence of aneurysm, dissection, vasculitis or significant stenosis. Renals: Both renal arteries are patent without evidence of aneurysm, dissection, vasculitis, fibromuscular dysplasia or significant stenosis. IMA: Patent without evidence of aneurysm, dissection, vasculitis or significant  stenosis. Inflow: Patent without evidence of aneurysm, dissection, vasculitis or significant stenosis. Veins: No obvious venous abnormality within the limitations of this arterial phase study. Review of the MIP images confirms the above findings. NON-VASCULAR Hepatobiliary: No gallstones are noted. Fatty infiltration of the liver is noted. Pancreas: Unremarkable. No pancreatic ductal dilatation or surrounding inflammatory changes. Spleen: Normal in size without focal abnormality. Adrenals/Urinary Tract: Adrenal glands are unremarkable. Kidneys are normal, without renal calculi, focal lesion, or hydronephrosis. Bladder is unremarkable. Stomach/Bowel: Stomach is within normal limits. Appendix appears normal. No evidence of bowel wall thickening, distention, or inflammatory changes. Lymphatic: No significant vascular findings are present. No enlarged abdominal or pelvic lymph nodes. Reproductive: Status post hysterectomy. No adnexal masses. Other: No abdominal wall hernia or abnormality. No abdominopelvic ascites. Musculoskeletal: No acute or significant  osseous findings. Review of the MIP images confirms the above findings. IMPRESSION: No evidence of thoracic or abdominal aortic dissection or aneurysm. Fatty infiltration of the liver. No other definite abnormality seen in the chest, abdomen or pelvis. Electronically Signed   By: Lupita Raider, M.D.   On: 02/27/2017 13:07    Procedures Procedures (including critical care time)  Medications Ordered in ED Medications  iopamidol (ISOVUE-370) 76 % injection (100 mLs  Contrast Given 02/27/17 1227)  aspirin chewable tablet 324 mg (324 mg Oral Given 02/27/17 1403)     Initial Impression / Assessment and Plan / ED Course  I have reviewed the triage vital signs and the nursing notes.  Pertinent labs & imaging results that were available during my care of the patient were reviewed by me and considered in my medical decision making (see chart for details).       This is a 41 y.o. Female who presents to the ED complaining of chest pain with onset last night. Patient reports she began having some substernal and nonradiating chest pain last night before she went to bed. She reports she will begin her on 5:30 this morning with substernal chest pain. She reports she developed associated numbness and tingling to her right upper extremity that extends into her right third finger. She reports this seems to come and go. She denies any neck or back pain or weakness. Her chest pain has been constant since she awoke at 5:30 this morning. She does report some slight shortness of breath earlier, but none currently. She denies any dyspnea on exertion. She has a history of pericarditis back in 2013. On exam the patient is afebrile nontoxic appearing. She has no tachypnea, tachycardia or hypoxia on exam. Lungs are clear to auscultation bilaterally. On neurological exam she reports some decreased sensation to her right upper extremity, otherwise she has no focal neurological deficits. She has no weakness. She is good and equal pulses bilaterally. EKG shows normal sinus rhythm and is without change from her previous tracing. Her troponin and delta troponin are both not elevated. BMP and CBC are unremarkable. Chest x-ray is unremarkable. CT angiogram of the chest, abdomen and pelvis was obtained to rule out dissection with her unusual numbness with associated chest pain. This imaging showed no abnormal findings. No evidence for dissection. Workup here is reassuring. She reports feeling better after aspirin. She reports her numbness is coming and going and she has none currently at reevaluation prior to discharge. I encouraged her to follow-up with primary care. I discussed strict and specific return precautions. I advised the patient to follow-up with their primary care provider this week. I advised the patient to return to the emergency department with new or worsening symptoms or new  concerns. The patient verbalized understanding and agreement with plan.    This patient was discussed with Dr. Erma Heritage who agrees with assessment and plan.   Final Clinical Impressions(s) / ED Diagnoses   Final diagnoses:  Precordial pain  Paresthesia    New Prescriptions New Prescriptions   No medications on file     Everlene Farrier, Cordelia Poche 02/27/17 1503    Shaune Pollack, MD 02/28/17 [phone]

## 2017-03-28 ENCOUNTER — Encounter (HOSPITAL_COMMUNITY): Payer: Self-pay | Admitting: Emergency Medicine

## 2017-03-28 ENCOUNTER — Ambulatory Visit (HOSPITAL_COMMUNITY)
Admission: EM | Admit: 2017-03-28 | Discharge: 2017-03-28 | Disposition: A | Discharge status: Home / Self Care | Payer: PRIVATE HEALTH INSURANCE | Attending: Nurse Practitioner | Admitting: Nurse Practitioner

## 2017-03-28 DIAGNOSIS — R197 Diarrhea, unspecified: Secondary | ICD-10-CM | POA: Diagnosis not present

## 2017-03-28 DIAGNOSIS — R112 Nausea with vomiting, unspecified: Secondary | ICD-10-CM | POA: Diagnosis not present

## 2017-03-28 DIAGNOSIS — A084 Viral intestinal infection, unspecified: Secondary | ICD-10-CM | POA: Diagnosis not present

## 2017-03-28 MED ORDER — ONDANSETRON 4 MG PO TBDP
ORAL_TABLET | ORAL | Status: AC
  Filled 2017-03-28: qty 1

## 2017-03-28 MED ORDER — ONDANSETRON 8 MG PO TBDP: 8.0000 mg | ORAL_TABLET | Freq: Three times a day (TID) | ORAL | 0 refills | Status: DC | PRN

## 2017-03-28 MED ORDER — ONDANSETRON 4 MG PO TBDP
4.0000 mg | ORAL_TABLET | Freq: Once | ORAL | Status: AC
  Administered 2017-03-28: 4 mg via ORAL

## 2017-03-28 MED ORDER — ONDANSETRON 4 MG PO TBDP: ORAL_TABLET | ORAL | 0 refills | Status: DC

## 2017-03-28 NOTE — ED Provider Notes (Signed)
[facility]    CSN: [id number] Arrival date & time: 03/28/17  4098     History   Chief Complaint Chief Complaint  Patient presents with  . Emesis  . Diarrhea    HPI Renee Knapp is a 41 y.o. female.   41 year old female states that she awoke this morning around 5:30 having nausea and vomiting. She states she has vomited 5 times and has had 5 episodes of watery diarrhea. Has seen no blood. Does complain of lower abdominal cramping associated with the diarrhea. Denies fever or chills. No chest pain or shortness of breath. 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      Past Medical History:  Diagnosis Date  . Anemia    History  . Diabetes mellitus without complication (HCC)    diet and exercise controlled - no meds  . Obesity   . Plantar fasciitis, bilateral    History - recvd cortisone injections bilateral- no current prob as 01/2014  . SVD (spontaneous vaginal delivery)    x 2    Patient Active Problem List   Diagnosis Date Noted  . Acute pericarditis 09/01/2015  . S/P laparoscopic assisted vaginal hysterectomy (LAVH) 02/24/2014    Past Surgical History:  Procedure Laterality Date  . DILATION AND CURETTAGE OF UTERUS     MAB  . TUBAL LIGATION    . WISDOM TOOTH EXTRACTION      OB History    Gravida Para Term Preterm AB Living   3       1 2    SAB TAB Ectopic Multiple Live Births   1               Home Medications    Prior to Admission medications   Medication Sig Start Date End Date Taking? Authorizing Provider  diphenhydrAMINE (BENADRYL) 25 mg capsule Take 1 capsule (25 mg total) by mouth every 6 (six) hours as needed. 12/07/16  Yes Barrett Henle, PA-C  hydrochlorothiazide (HYDRODIURIL) 25 MG tablet Take 25 mg by mouth daily.   Yes [provider]  METFORMIN HCL PO Take 500 mg by mouth daily.    Yes [provider]  EPINEPHrine (EPIPEN) 0.3 mg/0.3 mL SOAJ injection  Inject 0.3 mg into the muscle once as needed (for allergic reaction).    [provider]  hydrOXYzine (ATARAX/VISTARIL) 10 MG tablet Take 1 tablet (10 mg total) by mouth every 6 (six) hours as needed for itching. 12/07/16   Barrett Henle, PA-C  naproxen (NAPROSYN) 500 MG tablet Take 1 tablet (500 mg total) by mouth 2 (two) times daily. 11/09/16   Cathie Hoops, Amy V, PA-C  ondansetron (ZOFRAN ODT) 4 MG disintegrating tablet One tab SL q 8h prn V and V. 03/28/17   Hayden Rasmussen, NP  predniSONE (DELTASONE) 20 MG tablet Take 2 tablets (40 mg total) by mouth daily. 12/07/16   Barrett Henle, PA-C    Family History Family History  Problem Relation Age of Onset  . Cancer Other   . Hypertension Other   . Diabetes Other     Social History Social History   Tobacco Use  . Smoking status: Never Smoker  . Smokeless tobacco: Never Used  Substance Use Topics  . Alcohol use: No  . Drug use: No     Allergies   Bee venom   Review of Systems Review of Systems  Constitutional: Positive for activity change and appetite change. Negative for  fever.  HENT: Negative.   Respiratory: Negative.   Cardiovascular: Negative for chest pain and leg swelling.  Gastrointestinal: Positive for abdominal pain, diarrhea, nausea and vomiting. Negative for anal bleeding and blood in stool.  Musculoskeletal: Negative.   Neurological: Negative.   All other systems reviewed and are negative.    Physical Exam Triage Vital Signs ED Triage Vitals  Enc Vitals Group     BP 03/28/17 1019 125/84     Pulse Rate 03/28/17 1019 79     Resp 03/28/17 1019 16     Temp 03/28/17 1019 98.6 F (37 C)     Temp Source 03/28/17 1019 Oral     SpO2 03/28/17 1019 99 %     Weight 03/28/17 1017 203 lb (92.1 kg)     Height 03/28/17 1017 5\' 4"  (1.626 m)     Head Circumference --      Peak Flow --      Pain Score 03/28/17 1017 9     Pain Loc --      Pain Edu? --      Excl. in GC? --    No data  found.  Updated Vital Signs BP 125/84   Pulse 79   Temp 98.6 F (37 C) (Oral)   Resp 16   Ht 5\' 4"  (1.626 m)   Wt 203 lb (92.1 kg)   LMP 02/06/2014   SpO2 99%   BMI 34.84 kg/m   Visual Acuity Right Eye Distance:   Left Eye Distance:   Bilateral Distance:    Right Eye Near:   Left Eye Near:    Bilateral Near:     Physical Exam  Constitutional: She is oriented to person, place, and time. She appears well-developed and well-nourished. No distress.  HENT:  Head: Normocephalic and atraumatic.  Mouth/Throat: No oropharyngeal exudate.  Eyes: EOM are normal.  Neck: Normal range of motion. Neck supple.  Cardiovascular: Normal rate, regular rhythm and normal heart sounds.  Pulmonary/Chest: Effort normal and breath sounds normal. No respiratory distress.  Abdominal: Soft. Bowel sounds are normal. There is no tenderness.  Mild tenderness across the lower abdomen. No rebound or guarding. No tenderness over McBurney's point.  Musculoskeletal: Normal range of motion.  Neurological: She is alert and oriented to person, place, and time. No cranial nerve deficit.  Skin: Skin is warm and dry.  Psychiatric: She has a normal mood and affect.     UC Treatments / Results  Labs (all labs ordered are listed, but only abnormal results are displayed) Labs Reviewed - No data to display  EKG  EKG Interpretation None       Radiology No results found.  Procedures Procedures (including critical care time)  Medications Ordered in UC Medications  ondansetron (ZOFRAN-ODT) disintegrating tablet 4 mg (not administered)     Initial Impression / Assessment and Plan / UC Course  I have reviewed the triage vital signs and the nursing notes.  Pertinent labs & imaging results that were available during my care of the patient were reviewed by me and considered in my medical decision making (see chart for details).    Start out with clear liquids in small frequent amounts.  include  Pedialyte to augment fluids and electrolytes that are lost with vomiting and diarrhea. Use the Imodium as directed. No more than 2 or 3 per day. Do not stop the diarrhea only want to slow it down. If your vomiting and diarrhea gets worse and you are losing more fluids  he may want to stop taking the metformin until you are better because dehydration and metformin can cause serious problems in your body.   Final Clinical Impressions(s) / UC Diagnoses   Final diagnoses:  Viral gastroenteritis  Nausea vomiting and diarrhea    ED Discharge Orders        Ordered    ondansetron (ZOFRAN-ODT) 8 MG disintegrating tablet  Every 8 hours PRN,   Status:  Discontinued     03/28/17 1038    ondansetron (ZOFRAN ODT) 4 MG disintegrating tablet     03/28/17 1050       Controlled Substance Prescriptions [place] Controlled Substance Registry consulted? Not Applicable   Hayden RasmussenMabe, Jaimi Belle, NP 03/28/17 1054

## 2017-03-28 NOTE — ED Triage Notes (Signed)
PT reports vomiting and diarrhea that started at 0530 this morning. PT reports abdominal cramping as well. 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

## 2017-03-28 NOTE — Discharge Instructions (Addendum)
Start out with clear liquids in small frequent amounts.  include Pedialyte to augment fluids and electrolytes that are lost with vomiting and diarrhea. Use the Imodium as directed. No more than 2 or 3 per day. Do not stop the diarrhea only want to slow it down. If your vomiting and diarrhea gets worse and you are losing more fluids he may want to stop taking the metformin until you are better because dehydration and metformin can cause serious problems in your body.

## 2017-05-11 ENCOUNTER — Encounter (HOSPITAL_COMMUNITY): Payer: Self-pay | Admitting: Emergency Medicine

## 2017-05-11 ENCOUNTER — Ambulatory Visit (HOSPITAL_COMMUNITY)
Admission: EM | Admit: 2017-05-11 | Discharge: 2017-05-11 | Disposition: A | Discharge status: Home / Self Care | Payer: Medicaid Other | Attending: Family Medicine | Admitting: Family Medicine

## 2017-05-11 ENCOUNTER — Other Ambulatory Visit: Payer: Self-pay

## 2017-05-11 DIAGNOSIS — E119 Type 2 diabetes mellitus without complications: Secondary | ICD-10-CM

## 2017-05-11 DIAGNOSIS — H9203 Otalgia, bilateral: Secondary | ICD-10-CM

## 2017-05-11 DIAGNOSIS — I10 Essential (primary) hypertension: Secondary | ICD-10-CM | POA: Diagnosis not present

## 2017-05-11 DIAGNOSIS — R5383 Other fatigue: Secondary | ICD-10-CM

## 2017-05-11 LAB — POCT I-STAT, CHEM 8
BUN: 7 mg/dL (ref 6–20)
CREATININE: 0.8 mg/dL (ref 0.44–1.00)
Calcium, Ion: 1.14 mmol/L — ABNORMAL LOW (ref 1.15–1.40)
Chloride: 100 mmol/L — ABNORMAL LOW (ref 101–111)
GLUCOSE: 110 mg/dL — AB (ref 65–99)
HCT: 43 % (ref 36.0–46.0)
Hemoglobin: 14.6 g/dL (ref 12.0–15.0)
POTASSIUM: 4.1 mmol/L (ref 3.5–5.1)
Sodium: 139 mmol/L (ref 135–145)
TCO2: 29 mmol/L (ref 22–32)

## 2017-05-11 MED ORDER — HYDROCHLOROTHIAZIDE 25 MG PO TABS: 25.0000 mg | ORAL_TABLET | Freq: Every day | ORAL | 0 refills | Status: DC

## 2017-05-11 MED ORDER — METFORMIN HCL 500 MG PO TABS: 500.0000 mg | ORAL_TABLET | Freq: Every day | ORAL | 0 refills | Status: DC

## 2017-05-11 NOTE — ED Triage Notes (Signed)
Pt also requesting the provider look at her L ear, pain on the outside of it with palpation.

## 2017-05-11 NOTE — Discharge Instructions (Signed)
Need to obtain a primary care provider as soon as possible. You have medication for one month. Your blood sugar was 110 which is good. The rest of your blood work was normal.

## 2017-05-11 NOTE — ED Triage Notes (Signed)
Pt states "I think my blood sugar dropped, I feel really tired. Ive got diabetes and I haven't been taking my medicines because I ran out and I dont have insurance." Pt last took her medicines Monday., does not have any way to check her blood sugar. Pt had breakfast this morning. Pt is AAOX4, in NAD.

## 2017-05-11 NOTE — ED Provider Notes (Signed)
[facility]    CSN: [id number] Arrival date & time: 05/11/17  1012     History   Chief Complaint Chief Complaint  Patient presents with  . Fatigue  . Otalgia    HPI Renee Knapp is a 41 y.o. female.   41 year old female with a history of type 2 diabetes mellitus treated with metformin states that she ran out of her medicines 3 days ago. His medicines include HCTZ and metformin once a day. She does not know the dosages. She has not been checking her blood sugars in the past several months because she was told she did not need to have a glucose monitoring device. She does not know her blood sugars are. Her chief complaint is fatigue and just feeling tired.  Review of systems She has no chest pain, shortness of breath or edema, abdominal pain, nausea or vomiting, rash, headache, focal paresthesias or weakness.      Past Medical History:  Diagnosis Date  . Anemia    History  . Diabetes mellitus without complication (HCC)    diet and exercise controlled - no meds  . Obesity   . Plantar fasciitis, bilateral    History - recvd cortisone injections bilateral- no current prob as 01/2014  . SVD (spontaneous vaginal delivery)    x 2    Patient Active Problem List   Diagnosis Date Noted  . Acute pericarditis 09/01/2015  . S/P laparoscopic assisted vaginal hysterectomy (LAVH) 02/24/2014    Past Surgical History:  Procedure Laterality Date  . CYSTOSCOPY N/A 02/24/2014   Procedure: CYSTOSCOPY;  Surgeon: Essie HartWalda Pinn, MD;  Location: [facility];  Service: Gynecology;  Laterality: N/A;  . DILATION AND CURETTAGE OF UTERUS     MAB  . LAPAROSCOPIC ASSISTED VAGINAL HYSTERECTOMY N/A 02/24/2014   Procedure: LAPAROSCOPIC ASSISTED VAGINAL HYSTERECTOMY WITH BILATERAL SALPINGECTOMY ;  Surgeon: Essie HartWalda Pinn, MD;  Location: [facility];  Service: Gynecology;  Laterality: N/A;  . TUBAL LIGATION    . WISDOM TOOTH EXTRACTION      OB History    Gravida Para Term Preterm AB Living   3        1 2    SAB TAB Ectopic Multiple Live Births   1               Home Medications    Prior to Admission medications   Medication Sig Start Date End Date Taking? Authorizing Provider  diphenhydrAMINE (BENADRYL) 25 mg capsule Take 1 capsule (25 mg total) by mouth every 6 (six) hours as needed. 12/07/16   Barrett HenleNadeau, Nicole Elizabeth, PA-C  EPINEPHrine (EPIPEN) 0.3 mg/0.3 mL SOAJ injection Inject 0.3 mg into the muscle once as needed (for allergic reaction).    [provider]  hydrochlorothiazide (HYDRODIURIL) 25 MG tablet Take 1 tablet (25 mg total) by mouth daily. 05/11/17   Hayden RasmussenMabe, Cedric Mcclaine, NP  hydrOXYzine (ATARAX/VISTARIL) 10 MG tablet Take 1 tablet (10 mg total) by mouth every 6 (six) hours as needed for itching. 12/07/16   Barrett HenleNadeau, Nicole Elizabeth, PA-C  metFORMIN (GLUCOPHAGE) 500 MG tablet Take 1 tablet (500 mg total) by mouth daily with breakfast. 1 tab po daily with the largest meal of the day. 05/11/17   Hayden RasmussenMabe, Santonio Speakman, NP  naproxen (NAPROSYN) 500 MG tablet Take 1 tablet (500 mg total) by mouth 2 (two) times daily. 11/09/16   Cathie HoopsYu, Amy V, PA-C  ondansetron (ZOFRAN ODT) 4 MG disintegrating tablet One tab SL q 8h prn V and V. 03/28/17   Arhianna Ebey,  Onalee Hua, NP  predniSONE (DELTASONE) 20 MG tablet Take 2 tablets (40 mg total) by mouth daily. 12/07/16   Barrett Henle, PA-C    Family History Family History  Problem Relation Age of Onset  . Cancer Other   . Hypertension Other   . Diabetes Other     Social History Social History   Tobacco Use  . Smoking status: Never Smoker  . Smokeless tobacco: Never Used  Substance Use Topics  . Alcohol use: No  . Drug use: No     Allergies   Bee venom   Review of Systems Review of Systems  All other systems reviewed and are negative.    Physical Exam Triage Vital Signs ED Triage Vitals [05/11/17 1050]  Enc Vitals Group     BP 113/65     Pulse Rate 74     Resp 16     Temp 98.5 F (36.9 C)     Temp src      SpO2 100 %     Weight       Height      Head Circumference      Peak Flow      Pain Score      Pain Loc      Pain Edu?      Excl. in GC?    No data found.  Updated Vital Signs BP 113/65   Pulse 74   Temp 98.5 F (36.9 C)   Resp 16   LMP 02/06/2014   SpO2 100%   Visual Acuity Right Eye Distance:   Left Eye Distance:   Bilateral Distance:    Right Eye Near:   Left Eye Near:    Bilateral Near:     Physical Exam  Constitutional: She is oriented to person, place, and time. She appears well-developed and well-nourished. No distress.  HENT:  Bilateral lower ears appear normal. Patient points to the left tragus as a source of tenderness. There is no swelling, discoloration, redness or signs of infection. No lesions. It is symmetric to the right tragus. Bilateral TMs are normal. EACs are clear.   Eyes: EOM are normal.  Neck: Normal range of motion. Neck supple.  Cardiovascular: Normal rate, regular rhythm and normal heart sounds.  Pulmonary/Chest: Effort normal and breath sounds normal. No respiratory distress.  Musculoskeletal: Normal range of motion. She exhibits no edema.  Lymphadenopathy:    She has no cervical adenopathy.  Neurological: She is alert and oriented to person, place, and time. She exhibits normal muscle tone.  Skin: Skin is warm and dry.  Psychiatric: She has a normal mood and affect.  Nursing note and vitals reviewed.    UC Treatments / Results  Labs (all labs ordered are listed, but only abnormal results are displayed) Labs Reviewed  POCT I-STAT, CHEM 8 - Abnormal; Notable for the following components:      Result Value   Chloride 100 (*)    Glucose, Bld 110 (*)    Calcium, Ion 1.14 (*)    All other components within normal limits    EKG  EKG Interpretation None       Radiology No results found.  Procedures Procedures (including critical care time)  Medications Ordered in UC Medications - No data to display   Initial Impression / Assessment and Plan /  UC Course  I have reviewed the triage vital signs and the nursing notes.  Pertinent labs & imaging results that were available during my care of the  patient were reviewed by me and considered in my medical decision making (see chart for details).    Need to obtain a primary care provider as soon as possible. You have medication for one month. Your blood sugar was 110 which is good. The rest of your blood work was normal.     Final Clinical Impressions(s) / UC Diagnoses   Final diagnoses:  Type 2 diabetes mellitus without complication, without long-term current use of insulin (HCC)  Other fatigue  Essential hypertension    ED Discharge Orders        Ordered    metFORMIN (GLUCOPHAGE) 500 MG tablet  Daily with breakfast     05/11/17 1152    hydrochlorothiazide (HYDRODIURIL) 25 MG tablet  Daily     05/11/17 1152       Controlled Substance Prescriptions [place] Controlled Substance Registry consulted? Not Applicable   Hayden RasmussenMabe, Lavone Barrientes, NP 05/11/17 1155

## 2017-06-23 ENCOUNTER — Encounter (HOSPITAL_COMMUNITY): Payer: Self-pay | Admitting: Emergency Medicine

## 2017-06-23 ENCOUNTER — Ambulatory Visit (HOSPITAL_COMMUNITY)
Admission: EM | Admit: 2017-06-23 | Discharge: 2017-06-23 | Disposition: A | Discharge status: Home / Self Care | Payer: Medicaid Other | Attending: Internal Medicine | Admitting: Internal Medicine

## 2017-06-23 DIAGNOSIS — R69 Illness, unspecified: Secondary | ICD-10-CM

## 2017-06-23 DIAGNOSIS — J111 Influenza due to unidentified influenza virus with other respiratory manifestations: Secondary | ICD-10-CM

## 2017-06-23 DIAGNOSIS — M791 Myalgia, unspecified site: Secondary | ICD-10-CM

## 2017-06-23 DIAGNOSIS — R05 Cough: Secondary | ICD-10-CM

## 2017-06-23 DIAGNOSIS — H66003 Acute suppurative otitis media without spontaneous rupture of ear drum, bilateral: Secondary | ICD-10-CM

## 2017-06-23 DIAGNOSIS — J029 Acute pharyngitis, unspecified: Secondary | ICD-10-CM

## 2017-06-23 MED ORDER — MELOXICAM 7.5 MG PO TABS: 7.5000 mg | ORAL_TABLET | Freq: Every day | ORAL | 0 refills | Status: DC

## 2017-06-23 MED ORDER — CETIRIZINE-PSEUDOEPHEDRINE ER 5-120 MG PO TB12: 1.0000 | ORAL_TABLET | Freq: Every day | ORAL | 0 refills | Status: DC

## 2017-06-23 MED ORDER — AMOXICILLIN 875 MG PO TABS: 875.0000 mg | ORAL_TABLET | Freq: Two times a day (BID) | ORAL | 0 refills | Status: AC

## 2017-06-23 MED ORDER — BENZONATATE 100 MG PO CAPS: 100.0000 mg | ORAL_CAPSULE | Freq: Three times a day (TID) | ORAL | 0 refills | Status: DC

## 2017-06-23 MED ORDER — FLUTICASONE PROPIONATE 50 MCG/ACT NA SUSP: 2.0000 | Freq: Every day | NASAL | 0 refills | Status: DC

## 2017-06-23 NOTE — ED Triage Notes (Signed)
PT C/O: cold like sx onset last night associated w/body aches, sore throat, left ear pain, neck pain, cough, runny nose  DENIES: fevers  TAKING MEDS: none   A&O x4... NAD... Ambulatory

## 2017-06-23 NOTE — ED Provider Notes (Signed)
[facility]    CSN: [id number] Arrival date & time: 06/23/17  1005     History   Chief Complaint Chief Complaint  Patient presents with  . URI    HPI Renee GLANDON is a 42 y.o. female.   42 year old female with history of diabetes comes in for 1 day history of URI symptoms.  Experiencing body aches, sore throat, bilateral ear pain (L>R), neck pain, nonproductive cough, rhinorrhea.  Denies fever, chills, night sweats. Denies neck stiffness, headache, photophobia, nausea, vomiting.  otc theraflu last night. Positive sick contact. Never smoker.        Past Medical History:  Diagnosis Date  . Anemia    History  . Diabetes mellitus without complication (HCC)    diet and exercise controlled - no meds  . Obesity   . Plantar fasciitis, bilateral    History - recvd cortisone injections bilateral- no current prob as 01/2014  . SVD (spontaneous vaginal delivery)    x 2    Patient Active Problem List   Diagnosis Date Noted  . Acute pericarditis 09/01/2015  . S/P laparoscopic assisted vaginal hysterectomy (LAVH) 02/24/2014    Past Surgical History:  Procedure Laterality Date  . CYSTOSCOPY N/A 02/24/2014   Procedure: CYSTOSCOPY;  Surgeon: Essie Hart, MD;  Location: [facility];  Service: Gynecology;  Laterality: N/A;  . DILATION AND CURETTAGE OF UTERUS     MAB  . LAPAROSCOPIC ASSISTED VAGINAL HYSTERECTOMY N/A 02/24/2014   Procedure: LAPAROSCOPIC ASSISTED VAGINAL HYSTERECTOMY WITH BILATERAL SALPINGECTOMY ;  Surgeon: Essie Hart, MD;  Location: [facility];  Service: Gynecology;  Laterality: N/A;  . TUBAL LIGATION    . WISDOM TOOTH EXTRACTION      OB History    Gravida Para Term Preterm AB Living   3       1 2    SAB TAB Ectopic Multiple Live Births   1               Home Medications    Prior to Admission medications   Medication Sig Start Date End Date Taking? Authorizing Provider  hydrochlorothiazide (HYDRODIURIL) 25 MG tablet Take 1 tablet (25 mg total) by mouth  daily. 05/11/17  Yes Mabe, Onalee Hua, NP  metFORMIN (GLUCOPHAGE) 500 MG tablet Take 1 tablet (500 mg total) by mouth daily with breakfast. 1 tab po daily with the largest meal of the day. 05/11/17  Yes Mabe, Onalee Hua, NP  amoxicillin (AMOXIL) 875 MG tablet Take 1 tablet (875 mg total) by mouth 2 (two) times daily for 7 days. 06/23/17 06/30/17  Belinda Fisher, PA-C  benzonatate (TESSALON) 100 MG capsule Take 1 capsule (100 mg total) by mouth every 8 (eight) hours. 06/23/17   Cathie Hoops, Amy V, PA-C  cetirizine-pseudoephedrine (ZYRTEC-D) 5-120 MG tablet Take 1 tablet by mouth daily. 06/23/17   Cathie Hoops, Amy V, PA-C  diphenhydrAMINE (BENADRYL) 25 mg capsule Take 1 capsule (25 mg total) by mouth every 6 (six) hours as needed. 12/07/16   Barrett Henle, PA-C  EPINEPHrine (EPIPEN) 0.3 mg/0.3 mL SOAJ injection Inject 0.3 mg into the muscle once as needed (for allergic reaction).    [provider]  fluticasone (FLONASE) 50 MCG/ACT nasal spray Place 2 sprays into both nostrils daily. 06/23/17   Cathie Hoops, Amy V, PA-C  meloxicam (MOBIC) 7.5 MG tablet Take 1 tablet (7.5 mg total) by mouth daily. 06/23/17   Cathie Hoops, Amy V, PA-C  naproxen (NAPROSYN) 500 MG tablet Take 1 tablet (500 mg total) by mouth 2 (two)  times daily. 11/09/16   Belinda FisherYu, Amy V, PA-C    Family History Family History  Problem Relation Age of Onset  . Cancer Other   . Hypertension Other   . Diabetes Other     Social History Social History   Tobacco Use  . Smoking status: Never Smoker  . Smokeless tobacco: Never Used  Substance Use Topics  . Alcohol use: No  . Drug use: No     Allergies   Bee venom   Review of Systems Review of Systems  Reason unable to perform ROS: See HPI as above.     Physical Exam Triage Vital Signs ED Triage Vitals  Enc Vitals Group     BP 06/23/17 1055 122/68     Pulse Rate 06/23/17 1055 75     Resp 06/23/17 1055 18     Temp 06/23/17 1055 98.8 F (37.1 C)     Temp Source 06/23/17 1055 Oral     SpO2 06/23/17 1055 99 %      Weight --      Height --      Head Circumference --      Peak Flow --      Pain Score 06/23/17 1056 8     Pain Loc --      Pain Edu? --      Excl. in GC? --    No data found.  Updated Vital Signs BP 122/68 (BP Location: Left Arm)   Pulse 75   Temp 98.8 F (37.1 C) (Oral)   Resp 18   LMP 02/06/2014   SpO2 99%   Physical Exam  Constitutional: She is oriented to person, place, and time. She appears well-developed and well-nourished. No distress.  HENT:  Head: Normocephalic and atraumatic.  Right Ear: External ear and ear canal normal. Tympanic membrane is erythematous and bulging.  Left Ear: External ear and ear canal normal. Tympanic membrane is erythematous and bulging.  Nose: Mucosal edema and rhinorrhea present. Right sinus exhibits no maxillary sinus tenderness and no frontal sinus tenderness. Left sinus exhibits no maxillary sinus tenderness and no frontal sinus tenderness.  Mouth/Throat: Uvula is midline, oropharynx is clear and moist and mucous membranes are normal. No tonsillar exudate.  Eyes: Conjunctivae, EOM and lids are normal. Pupils are equal, round, and reactive to light.  Neck: Normal range of motion. Neck supple. Muscular tenderness present. No spinous process tenderness present. Normal range of motion present.  Cardiovascular: Normal rate, regular rhythm and normal heart sounds. Exam reveals no gallop and no friction rub.  No murmur heard. Pulmonary/Chest: Effort normal and breath sounds normal. She has no decreased breath sounds. She has no wheezes. She has no rhonchi. She has no rales.  Musculoskeletal:  Tenderness on palpation of bilateral trapezius muscle.  No bony tenderness.  Full range of motion of shoulders.  Strength normal and equal bilaterally.  Sensation intact and equal bilaterally.  Lymphadenopathy:    She has no cervical adenopathy.  Neurological: She is alert and oriented to person, place, and time.  Skin: Skin is warm and dry.  Psychiatric: She  has a normal mood and affect. Her behavior is normal. Judgment normal.     UC Treatments / Results  Labs (all labs ordered are listed, but only abnormal results are displayed) Labs Reviewed - No data to display  EKG  EKG Interpretation None       Radiology No results found.  Procedures Procedures (including critical care time)  Medications Ordered in UC  Medications - No data to display   Initial Impression / Assessment and Plan / UC Course  I have reviewed the triage vital signs and the nursing notes.  Pertinent labs & imaging results that were available during my care of the patient were reviewed by me and considered in my medical decision making (see chart for details).    Amoxicillin for otitis media.  Mobic for body aches.  Other symptomatic treatment discussed.  Push fluids.  Return precautions given.  Patient expresses understanding and agrees to plan.  Final Clinical Impressions(s) / UC Diagnoses   Final diagnoses:  Acute suppurative otitis media of both ears without spontaneous rupture of tympanic membranes, recurrence not specified  Influenza-like illness    ED Discharge Orders        Ordered    amoxicillin (AMOXIL) 875 MG tablet  2 times daily     06/23/17 1109    fluticasone (FLONASE) 50 MCG/ACT nasal spray  Daily     06/23/17 1109    cetirizine-pseudoephedrine (ZYRTEC-D) 5-120 MG tablet  Daily     06/23/17 1109    benzonatate (TESSALON) 100 MG capsule  Every 8 hours     06/23/17 1109    meloxicam (MOBIC) 7.5 MG tablet  Daily     06/23/17 1110        Belinda Fisher, [place] 06/23/17 1114

## 2017-06-23 NOTE — Discharge Instructions (Signed)
Amoxicillin for ear infection. Tessalon for cough. Start flonase, zyrtec-D for nasal congestion/drainage. You can use over the counter nasal saline rinse such as neti pot for nasal congestion. Keep hydrated, your urine should be clear to pale yellow in color. Mobic for body aches. Monitor for any worsening of symptoms, chest pain, shortness of breath, wheezing, swelling of the throat, follow up for reevaluation.   For sore throat try using a honey-based tea. Use 3 teaspoons of honey with juice squeezed from half lemon. Place shaved pieces of ginger into 1/2-1 cup of water and warm over stove top. Then mix the ingredients and repeat every 4 hours as needed.

## 2017-08-16 ENCOUNTER — Encounter (HOSPITAL_COMMUNITY): Payer: Self-pay | Admitting: Emergency Medicine

## 2017-08-16 ENCOUNTER — Ambulatory Visit (HOSPITAL_COMMUNITY)
Admission: EM | Admit: 2017-08-16 | Discharge: 2017-08-16 | Disposition: A | Discharge status: Home / Self Care | Payer: Managed Care, Other (non HMO) | Attending: Urgent Care | Admitting: Urgent Care

## 2017-08-16 DIAGNOSIS — Z9889 Other specified postprocedural states: Secondary | ICD-10-CM | POA: Diagnosis not present

## 2017-08-16 DIAGNOSIS — E669 Obesity, unspecified: Secondary | ICD-10-CM | POA: Diagnosis not present

## 2017-08-16 DIAGNOSIS — Z91030 Bee allergy status: Secondary | ICD-10-CM | POA: Diagnosis not present

## 2017-08-16 DIAGNOSIS — M79601 Pain in right arm: Secondary | ICD-10-CM

## 2017-08-16 DIAGNOSIS — Z9851 Tubal ligation status: Secondary | ICD-10-CM | POA: Insufficient documentation

## 2017-08-16 DIAGNOSIS — Z8739 Personal history of other diseases of the musculoskeletal system and connective tissue: Secondary | ICD-10-CM

## 2017-08-16 DIAGNOSIS — E119 Type 2 diabetes mellitus without complications: Secondary | ICD-10-CM | POA: Insufficient documentation

## 2017-08-16 DIAGNOSIS — M7989 Other specified soft tissue disorders: Secondary | ICD-10-CM | POA: Diagnosis not present

## 2017-08-16 DIAGNOSIS — M722 Plantar fascial fibromatosis: Secondary | ICD-10-CM | POA: Insufficient documentation

## 2017-08-16 DIAGNOSIS — M25531 Pain in right wrist: Secondary | ICD-10-CM

## 2017-08-16 DIAGNOSIS — M2550 Pain in unspecified joint: Secondary | ICD-10-CM | POA: Insufficient documentation

## 2017-08-16 DIAGNOSIS — I10 Essential (primary) hypertension: Secondary | ICD-10-CM | POA: Insufficient documentation

## 2017-08-16 DIAGNOSIS — M79641 Pain in right hand: Secondary | ICD-10-CM | POA: Diagnosis present

## 2017-08-16 DIAGNOSIS — Z7984 Long term (current) use of oral hypoglycemic drugs: Secondary | ICD-10-CM | POA: Insufficient documentation

## 2017-08-16 DIAGNOSIS — Z79899 Other long term (current) drug therapy: Secondary | ICD-10-CM | POA: Diagnosis not present

## 2017-08-16 DIAGNOSIS — M109 Gout, unspecified: Secondary | ICD-10-CM | POA: Diagnosis not present

## 2017-08-16 LAB — URIC ACID: Uric Acid, Serum: 7.1 mg/dL — ABNORMAL HIGH (ref 2.3–6.6)

## 2017-08-16 MED ORDER — CELECOXIB 200 MG PO CAPS: 200.0000 mg | ORAL_CAPSULE | Freq: Every day | ORAL | 0 refills | Status: DC

## 2017-08-16 NOTE — Discharge Instructions (Addendum)
Follow up with your PCP or occupational health if your symptoms persist.
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
## 2017-08-16 NOTE — ED Triage Notes (Signed)
Pt sts right hand pain and swelling; pt denies obvious injury

## 2017-08-17 ENCOUNTER — Encounter (HOSPITAL_COMMUNITY): Payer: Self-pay | Admitting: Urgent Care

## 2017-08-27 ENCOUNTER — Emergency Department (HOSPITAL_COMMUNITY)
Admission: EM | Admit: 2017-08-27 | Discharge: 2017-08-27 | Disposition: A | Discharge status: Home / Self Care | Payer: Managed Care, Other (non HMO) | Attending: Emergency Medicine | Admitting: Emergency Medicine

## 2017-08-27 ENCOUNTER — Other Ambulatory Visit: Payer: Self-pay

## 2017-08-27 ENCOUNTER — Encounter (HOSPITAL_COMMUNITY): Payer: Self-pay

## 2017-08-27 ENCOUNTER — Emergency Department (HOSPITAL_COMMUNITY): Payer: Managed Care, Other (non HMO)

## 2017-08-27 DIAGNOSIS — M25531 Pain in right wrist: Secondary | ICD-10-CM | POA: Diagnosis present

## 2017-08-27 DIAGNOSIS — Z7984 Long term (current) use of oral hypoglycemic drugs: Secondary | ICD-10-CM | POA: Diagnosis not present

## 2017-08-27 DIAGNOSIS — E119 Type 2 diabetes mellitus without complications: Secondary | ICD-10-CM | POA: Insufficient documentation

## 2017-08-27 DIAGNOSIS — Z79899 Other long term (current) drug therapy: Secondary | ICD-10-CM | POA: Diagnosis not present
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
## 2017-08-27 NOTE — Discharge Instructions (Addendum)
The cause of your pain is likely soft tissue related (tendon) or nerve.    Wear wrist splint at night time. Take 1000 mg acetaminophen (tylenol), ice, rest. Modify activities to avoid exacerbating activities. Follow up with hand surgery/orthopedist in 1-2 weeks if pain persists.   Return to the emergency if you have wrist drop, numbness, fevers, swelling, redness, warmth
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
## 2017-08-27 NOTE — ED Notes (Signed)
Patient transported to X-ray 

## 2017-09-15 ENCOUNTER — Other Ambulatory Visit: Payer: Self-pay | Admitting: Urgent Care

## 2017-11-10 ENCOUNTER — Ambulatory Visit: Payer: Managed Care, Other (non HMO) | Admitting: Family Medicine

## 2017-11-10 ENCOUNTER — Telehealth: Payer: Self-pay | Admitting: Family Medicine

## 2017-11-10 ENCOUNTER — Encounter: Payer: Self-pay | Admitting: Family Medicine

## 2017-11-10 VITALS — BP 118/84 | HR 84 | Temp 98.1°F | Ht 64.0 in | Wt 208.0 lb

## 2017-11-10 DIAGNOSIS — E119 Type 2 diabetes mellitus without complications: Secondary | ICD-10-CM | POA: Diagnosis not present

## 2017-11-10 DIAGNOSIS — R3915 Urgency of urination: Secondary | ICD-10-CM | POA: Diagnosis not present

## 2017-11-10 DIAGNOSIS — Z0000 Encounter for general adult medical examination without abnormal findings: Secondary | ICD-10-CM

## 2017-11-10 DIAGNOSIS — Z23 Encounter for immunization: Secondary | ICD-10-CM | POA: Diagnosis not present

## 2017-11-10 DIAGNOSIS — Z114 Encounter for screening for human immunodeficiency virus [HIV]: Secondary | ICD-10-CM | POA: Diagnosis not present

## 2017-11-10 DIAGNOSIS — B372 Candidiasis of skin and nail: Secondary | ICD-10-CM | POA: Diagnosis not present

## 2017-11-10 LAB — CBC WITH DIFFERENTIAL/PLATELET
Basophils Absolute: 0.1 10*3/uL (ref 0.0–0.1)
Basophils Relative: 0.7 % (ref 0.0–3.0)
EOS PCT: 4.6 % (ref 0.0–5.0)
Eosinophils Absolute: 0.3 10*3/uL (ref 0.0–0.7)
HCT: 39.4 % (ref 36.0–46.0)
Hemoglobin: 13.2 g/dL (ref 12.0–15.0)
LYMPHS ABS: 3.6 10*3/uL (ref 0.7–4.0)
Lymphocytes Relative: 48.2 % — ABNORMAL HIGH (ref 12.0–46.0)
MCHC: 33.5 g/dL (ref 30.0–36.0)
MCV: 81.9 fl (ref 78.0–100.0)
MONO ABS: 0.5 10*3/uL (ref 0.1–1.0)
MONOS PCT: 7 % (ref 3.0–12.0)
NEUTROS PCT: 39.5 % — AB (ref 43.0–77.0)
Neutro Abs: 3 10*3/uL (ref 1.4–7.7)
Platelets: 370 10*3/uL (ref 150.0–400.0)
RBC: 4.81 Mil/uL (ref 3.87–5.11)
RDW: 13.4 % (ref 11.5–15.5)
WBC: 7.5 10*3/uL (ref 4.0–10.5)

## 2017-11-10 LAB — POCT URINALYSIS DIPSTICK
BILIRUBIN UA: NEGATIVE
GLUCOSE UA: NEGATIVE
KETONES UA: NEGATIVE
Leukocytes, UA: NEGATIVE
Nitrite, UA: NEGATIVE
Protein, UA: NEGATIVE
Spec Grav, UA: 1.025 (ref 1.010–1.025)
Urobilinogen, UA: 0.2 E.U./dL
pH, UA: 6 (ref 5.0–8.0)

## 2017-11-10 LAB — MICROALBUMIN / CREATININE URINE RATIO
CREATININE, U: 151.6 mg/dL
MICROALB UR: 1.8 mg/dL (ref 0.0–1.9)
Microalb Creat Ratio: 1.2 mg/g (ref 0.0–30.0)

## 2017-11-10 LAB — COMPREHENSIVE METABOLIC PANEL
ALBUMIN: 4.4 g/dL (ref 3.5–5.2)
ALT: 17 U/L (ref 0–35)
AST: 19 U/L (ref 0–37)
Alkaline Phosphatase: 88 U/L (ref 39–117)
BILIRUBIN TOTAL: 0.3 mg/dL (ref 0.2–1.2)
BUN: 11 mg/dL (ref 6–23)
CALCIUM: 9.4 mg/dL (ref 8.4–10.5)
CHLORIDE: 101 meq/L (ref 96–112)
CO2: 28 mEq/L (ref 19–32)
Creatinine, Ser: 0.76 mg/dL (ref 0.40–1.20)
GFR: 106.36 mL/min (ref >60.00)
Glucose, Bld: 106 mg/dL — ABNORMAL HIGH (ref 70–99)
Potassium: 3.7 mEq/L (ref 3.5–5.1)
Sodium: 139 mEq/L (ref 135–145)
Total Protein: 8 g/dL (ref 6.0–8.3)

## 2017-11-10 LAB — TSH: TSH: 0.97 u[IU]/mL (ref 0.35–4.50)

## 2017-11-10 LAB — LIPID PANEL
Cholesterol: 195 mg/dL (ref 0–200)
HDL: 42.2 mg/dL (ref >39.00)
NonHDL: 153.16
Total CHOL/HDL Ratio: 5
Triglycerides: 333 mg/dL — ABNORMAL HIGH (ref 0.0–149.0)
VLDL: 66.6 mg/dL — AB (ref 0.0–40.0)

## 2017-11-10 LAB — LDL CHOLESTEROL, DIRECT: Direct LDL: 117 mg/dL

## 2017-11-10 LAB — HEMOGLOBIN A1C: HEMOGLOBIN A1C: 7.6 % — AB (ref 4.6–6.5)

## 2017-11-10 MED ORDER — CLOTRIMAZOLE 1 % EX CREA: 1.0000 "application " | TOPICAL_CREAM | Freq: Two times a day (BID) | CUTANEOUS | 3 refills | Status: DC

## 2017-11-10 NOTE — Progress Notes (Signed)
Patient: Renee Knapp MRN: [id number] DOB: [birth year] PCP: Renee Knapp, Renee Cirrincione, MD     Subjective:  Chief Complaint  Patient presents with  . Establish Care    pt is diabetic and needs refills on meds    HPI: The patient is a 42 y.o. female who presents today for annual exam. She denies any changes to past medical history. There have been no recent hospitalizations. They are not following a well balanced diet and exercise plan. Weight has been increasing steadily. No complaints today.   Type 2 diabetes: Diabetes: Patient is here for follow up of type 2 diabetes. First diagnosed 2016.  Currently on the following medications metformin XR 500mg  daily. Takes medications as prescribed. Last A1C unknown.  Currently exercising and following diabetic diet. Denies any hypoglycemic events. Denies any vision changes, nausea, vomiting, abdominal pain, ulcers/paraesthesia in feet, polyuria, polydipsia or polyphagia. Denies any chest pain, shortness of breath.   Urinary urgency: she has had urinary urgency over the past month. She has no dysuria or flank pain. No frequency. No gross hematuria. No frequent UTIs.   Questionable HTN? She has never been told she has high blood pressure. Was told hctz was started to help the metformin. She has been off hctz for 4 months.   Rash: She has a rash under her belly pannus. She was picking up a box on Tuesday and felt like something tore, but she has red lines and bumps. Doesn't really itch, but sweat makes it "annoying." She has not put anything on the rash.   Immunization History  Administered Date(s) Administered  . Influenza,inj,Quad PF,6+ Mos 02/25/2014  . Pneumococcal Polysaccharide-23 03/05/2013, 11/10/2017  . Tdap 10/10/2003, 11/12/2014, 11/10/2017   Colonoscopy: n/a  Mammogram: needs this.  Pap smear: not indicated secondary to hysterectomy  Tdap: today    Review of Systems  Constitutional: Negative for activity change, appetite change, fatigue  and fever.  Respiratory: Negative for shortness of breath.   Cardiovascular: Negative for chest pain and leg swelling.  Endocrine: Negative for cold intolerance, heat intolerance and polydipsia.  Genitourinary: Positive for urgency.  Skin: Positive for rash.  Neurological: Negative for headaches.  Psychiatric/Behavioral: Negative for agitation and confusion. The patient is not nervous/anxious.     Allergies Patient is allergic to bee venom.  Past Medical History Patient  has a past medical history of Anemia, Diabetes mellitus without complication (HCC), Obesity, Plantar fasciitis, bilateral, and SVD (spontaneous vaginal delivery).  Surgical History Patient  has a past surgical history that includes Tubal ligation; Dilation and curettage of uterus; Wisdom tooth extraction; Laparoscopic assisted vaginal hysterectomy (N/A, 02/24/2014); and Cystoscopy (N/A, 02/24/2014).  Family History Pateint's family history includes Cancer in her other; Diabetes in her mother and other; Hearing loss in her son; Heart disease in her mother; High Cholesterol in her father; Hypertension in her father, mother, other, sister, and sister; Kidney disease in her sister; Stroke in her sister.  Social History Patient  reports that she has never smoked. She has never used smokeless tobacco. She reports that she does not drink alcohol or use drugs.    Objective: Vitals:   11/10/17 1313  BP: 118/84  Pulse: 84  Temp: 98.1 F (36.7 C)  TempSrc: Oral  SpO2: 96%  Weight: 208 lb (94.3 kg)  Height: 5\' 4"  (1.626 m)    Body mass index is 35.7 kg/m.  Physical Exam  Constitutional: She is oriented to person, place, and time. She appears well-developed and well-nourished.  HENT:  Right Ear:  External ear normal.  Left Ear: External ear normal.  Mouth/Throat: Oropharynx is clear and moist.  TM pearly with light reflex bilaterally   Eyes: Pupils are equal, round, and reactive to light. Conjunctivae and EOM are  normal.  Neck: Normal range of motion. Neck supple. No thyromegaly present.  Cardiovascular: Normal rate, regular rhythm, normal heart sounds and intact distal pulses.  No murmur heard. Pulmonary/Chest: Effort normal and breath sounds normal.  Abdominal: Soft. Bowel sounds are normal. She exhibits no distension. There is no tenderness.  Lymphadenopathy:    She has no cervical adenopathy.  Neurological: She is alert and oriented to person, place, and time. She displays normal reflexes. No cranial nerve deficit. Coordination normal.  Skin: Skin is warm and dry. No rash noted.  Acanthosis nigrans around neck   Very mild erythema with satellite lesions under belly pannus  Psychiatric: She has a normal mood and affect. Her behavior is normal.  Vitals reviewed.      Assessment/plan: 1. Controlled type 2 diabetes mellitus without complication, without long-term current use of insulin (HCC) All labs today. Will get her a1c back before I fill her metformin incase we need to adjust dosage. Will do leap exam at next visit and discussed she needs to f/u with eye doc to screen for diabetic retinopathy.  She is working on diabetic diet and weight loss. Will f/u with me in 3-6 months depending on lab work. hypoglycemia precautions given. Risks of uncontrolled diabetes discussed. Requesting records from previous physician as well. Unsure why she was on hctz. No hx of HTN and wnl today off medication x 4 months. Will not start this back and continue to watch her bp.  - CBC with Differential/Platelet - Hemoglobin A1c - Lipid panel - TSH - Comprehensive metabolic panel - Microalbumin / creatinine urine ratio - Pneumococcal polysaccharide vaccine 23-valent greater than or equal to 2yo subcutaneous/IM  2. Encounter for screening for HIV  - HIV antibody  3. Need for Tdap vaccination  - Tdap vaccine greater than or equal to 7yo IM  4. Urinary urgency I think this may be more related to polyuria from  uncontrolled diabetes. Checking UA and culture and will treat uti if needed.  - Urine Culture - POCT urinalysis dipstick  5. Annual exam Routine lab work. Information given for mmg. Shots updated. No longer needs pap and cscope at age 49. F/u in one year or as needed.  Patient counseling [x]    Nutrition: Stressed importance of moderation in sodium/caffeine intake, saturated fat and cholesterol, caloric balance, sufficient intake of fresh fruits, vegetables, fiber, calcium, iron, and 1 mg of folate supplement per day (for females capable of pregnancy).  [x]    Stressed the importance of regular exercise.   [x]    Substance Abuse: Discussed cessation/primary prevention of tobacco, alcohol, or other drug use; driving or other dangerous activities under the influence; availability of treatment for abuse.   [x]    Injury prevention: Discussed safety belts, safety helmets, smoke detector, smoking near bedding or upholstery.   [x]    Sexuality: Discussed sexually transmitted diseases, partner selection, use of condoms, avoidance of unintended pregnancy  and contraceptive alternatives.  [x]    Dental health: Discussed importance of regular tooth brushing, flossing, and dental visits.  [x]    Health maintenance and immunizations reviewed. Please refer to Health maintenance section.   6. Candidal intertrigo Course of clotrimazole BID, keeping area dry and use of powder. Continue with clotrimazole 7 days after clears. Hygiene will be biggest key  to controlling this as well as weight loss. Let me know if not getting better.    Return in about 3 months (around 02/10/2018) for diabetes .     Renee Mustard, MD [facility]  11/10/2017

## 2017-11-10 NOTE — Patient Instructions (Signed)
Intertrigo Intertrigo is skin irritation (inflammation) that happens in warm, moist areas of the body. The irritation can cause a rash and make skin raw and itchy. The rash is usually pink or red. It happens mostly between folds of skin or where skin rubs together, such as:  Toes.  Armpits.  Groin.  Belly.  Breasts.  Buttocks.  This condition is not passed from person to person (is not contagious). Follow these instructions at home:  Keep the affected area clean and dry.  Do not scratch your skin.  Stay cool as much as possible. Use an air conditioner or fan, if you can.  Apply over-the-counter and prescription medicines only as told by your doctor.  If you were prescribed an antibiotic medicine, use it as told by your doctor. Do not stop using the antibiotic even if your condition starts to get better.  Keep all follow-up visits as told by your doctor. This is important. How is this prevented?  Stay at a healthy weight.  Keep your feet dry. This is very important if you have diabetes. Wear cotton or wool socks.  Take care of and protect the skin in your groin and butt area as told by your doctor.  Do not wear tight clothes. Wear clothes that: ? Are loose. ? Take away moisture from your body. ? Are made of cotton.  Wear a bra that gives good support, if needed.  Shower and dry yourself fully after being active.  Keep your blood sugar under control if you have diabetes. Contact a doctor if:  Your symptoms do not get better with treatment.  Your symptoms get worse or they spread.  You notice more redness and warmth.  You have a fever. This information is not intended to replace advice given to you by your health care provider. Make sure you discuss any questions you have with your health care provider. Document Released: 06/11/2010 Document Revised: 10/15/2015 Document Reviewed: 11/10/2014 Elsevier Interactive Patient Education  2018 [employer]vier Inc.  

## 2017-11-10 NOTE — Telephone Encounter (Signed)
Please let her know urine looks good. No signs of infection so we will wait for culture and see what her diabetes is running. Nice to meet her!

## 2017-11-10 NOTE — Telephone Encounter (Signed)
Called and left msg on pt's cell phone: Urine looked good, no infection.  Will await results of culture and see how diabetes is looking based on labs.  Per Dr. Beaulah CorinWolfe-very nice to meet her!

## 2017-11-11 LAB — URINE CULTURE
MICRO NUMBER: 90745345
SPECIMEN QUALITY:: ADEQUATE

## 2017-11-11 LAB — HIV ANTIBODY (ROUTINE TESTING W REFLEX): HIV 1&2 Ab, 4th Generation: NONREACTIVE

## 2017-11-13 ENCOUNTER — Other Ambulatory Visit: Payer: Self-pay | Admitting: Family Medicine

## 2017-11-13 DIAGNOSIS — E119 Type 2 diabetes mellitus without complications: Secondary | ICD-10-CM

## 2017-11-13 MED ORDER — METFORMIN HCL ER 500 MG PO TB24: 500.0000 mg | ORAL_TABLET | Freq: Every day | ORAL | 0 refills | Status: DC

## 2017-11-13 MED ORDER — ROSUVASTATIN CALCIUM 10 MG PO TABS: 10.0000 mg | ORAL_TABLET | Freq: Every day | ORAL | 3 refills | Status: DC

## 2017-11-15 ENCOUNTER — Telehealth: Payer: Self-pay

## 2017-11-15 NOTE — Telephone Encounter (Signed)
Attempted to contact pt on cell phone # listed.  Received message that number is temporarily not in service.  Letter sent out to pt to call our office

## 2017-11-20 ENCOUNTER — Telehealth: Payer: Self-pay | Admitting: Family Medicine

## 2017-11-20 ENCOUNTER — Other Ambulatory Visit: Payer: Self-pay

## 2017-11-20 NOTE — Telephone Encounter (Signed)
Copied from CRM [id number]. Topic: Quick Communication - Rx Refill/Question >> Nov 20, 2017 11:59 AM Gaynelle AduPoole, Shalonda wrote: Medication: EPINEPHrine (EPIPEN) 0.3 mg/0.3 mL SOAJ injection   Has the patient contacted their pharmacy? No  Preferred Pharmacy (with phone number or street name): [id number] [address] [phone] (Phone) [phone] (Fax)   Agent: Please be advised that RX refills may take up to 3 business days. We ask that you follow-up with your pharmacy.

## 2017-11-22 ENCOUNTER — Other Ambulatory Visit: Payer: Self-pay | Admitting: Family Medicine

## 2017-11-22 MED ORDER — EPINEPHRINE 0.3 MG/0.3ML IJ SOAJ
0.3000 mg | Freq: Once | INTRAMUSCULAR | 1 refills | Status: DC | PRN
Start: 2017-11-22 — End: 2019-02-15

## 2017-11-22 NOTE — Telephone Encounter (Signed)
Sent in epi pen to [facility] as requested.

## 2017-11-24 ENCOUNTER — Ambulatory Visit: Payer: Self-pay | Admitting: Family Medicine

## 2017-11-24 NOTE — Telephone Encounter (Signed)
Pt called c/o rash and swollen upper lip that began 1 hour after taking metformin and rosuvastatin. Pt stated that the rash looked like a dry patch of skin but red in color. Pt stated that the rash is located to both sides of cheeks and to forehead. Pt states rash has faded but still present. Pt stated that upper lip is still swollen but not as bad as yesterday. Pt stated that she had diarrhea 2 hour after taking her Metformin. Denies fever. Pt stated that the itch from the rash is mild.  Care advice given and [facility]   Reason for Disposition . Taking new prescription medicine  (Exceptions: finished taking new prescription antibiotic OR questions about flushing from niacin)  Answer Assessment - Initial Assessment Questions 1. APPEARANCE of RASH: "Describe the rash." (e.g., spots, blisters, raised areas, skin peeling, scaly)     Looked like dry patch skin but red 2. SIZE: "How big are the spots?" (e.g., tip of pen, eraser, coin; inches, centimeters)     Quarter sized 3. LOCATION: "Where is the rash located?"     Both sides of cheeks, forehead 4. COLOR: "What color is the rash?" (Note: It is difficult to assess rash color in people with darker-colored skin. When this situation occurs, simply ask the caller to describe what they see.)     red 5. ONSET: "When did the rash begin?"     yesterday 6. FEVER: "Do you have a fever?" If so, ask: "What is your temperature, how was it measured, and when did it start?"     no 7. ITCHING: "Does the rash itch?" If so, ask: "How bad is the itch?" (Scale 1-10; or mild, moderate, severe)     mild 8. CAUSE: "What do you think is causing the rash?"     Drug reaction to Metformin, rosuvastatin 9. NEW MEDICATION: "What new medication are you taking?" (e.g., name of antibiotic) "When did you start taking this medication?".     Metformin and rosuvastatin 10. OTHER SYMPTOMS: "Do you have any other symptoms?" (e.g., sore  throat, fever, joint pain)  swelling to top lip 11. PREGNANCY: "Is there any chance you are pregnant?" "When was your last menstrual period?"     No s/p hysterectomy  Protocols used: RASH - WIDESPREAD ON DRUGS-A-AH

## 2017-11-25 ENCOUNTER — Ambulatory Visit: Payer: Managed Care, Other (non HMO) | Admitting: Family Medicine

## 2017-11-25 ENCOUNTER — Other Ambulatory Visit: Payer: Self-pay

## 2017-11-25 ENCOUNTER — Encounter: Payer: Self-pay | Admitting: Family Medicine

## 2017-11-25 DIAGNOSIS — E119 Type 2 diabetes mellitus without complications: Secondary | ICD-10-CM

## 2017-11-25 DIAGNOSIS — E785 Hyperlipidemia, unspecified: Secondary | ICD-10-CM | POA: Insufficient documentation

## 2017-11-25 DIAGNOSIS — E1169 Type 2 diabetes mellitus with other specified complication: Secondary | ICD-10-CM | POA: Insufficient documentation

## 2017-11-25 MED ORDER — METFORMIN HCL ER 500 MG PO TB24: 500.0000 mg | ORAL_TABLET | Freq: Every day | ORAL | 0 refills | Status: DC

## 2017-11-25 NOTE — Progress Notes (Signed)
Subjective:    Patient ID: Renee Knapp, female    DOB: February 27, 1976, 42 y.o.   MRN: [id number]  Chief Complaint  Patient presents with  . Rash  . Medication Problem    HPI Patient is in today for evaluation of an adverse reaction to some medication changes. Her pharmacy switched her from one manufacturer to another (pills went from oblong to circular) but her prescription for Metformin XR 500 mg daily stayed the same. A few days later she developed a rash on her face, diarrhea and some swelling of her upper lip. She stopped the med and this resolved spontaneously. She was started on Crestor at the same time so it is unclear which med caused the reaction. She feels well today and tolerated the original dose of Metformin XR fine for a great amount of time. The swelling was minimal and no sense of SOB or throat closing.   Past Medical History:  Diagnosis Date  . Anemia    History  . Diabetes mellitus without complication (HCC)    diet and exercise controlled - no meds  . Obesity   . Plantar fasciitis, bilateral    History - recvd cortisone injections bilateral- no current prob as 01/2014  . SVD (spontaneous vaginal delivery)    x 2    Past Surgical History:  Procedure Laterality Date  . CYSTOSCOPY N/A 02/24/2014   Procedure: CYSTOSCOPY;  Surgeon: Essie Hart, MD;  Location: [facility];  Service: Gynecology;  Laterality: N/A;  . DILATION AND CURETTAGE OF UTERUS     MAB  . LAPAROSCOPIC ASSISTED VAGINAL HYSTERECTOMY N/A 02/24/2014   Procedure: LAPAROSCOPIC ASSISTED VAGINAL HYSTERECTOMY WITH BILATERAL SALPINGECTOMY ;  Surgeon: Essie Hart, MD;  Location: [facility];  Service: Gynecology;  Laterality: N/A;  . TUBAL LIGATION    . WISDOM TOOTH EXTRACTION      Family History  Problem Relation Age of Onset  . Cancer Other   . Hypertension Other   . Diabetes Other   . Diabetes Mother   . Heart disease Mother   . Hypertension Mother   . Hypertension Father   . High Cholesterol Father     . Hypertension Sister   . Kidney disease Sister   . Stroke Sister   . Hypertension Sister   . Hearing loss Son     Social History   Socioeconomic History  . Marital status: Divorced    Spouse name: Not on file  . Number of children: Not on file  . Years of education: Not on file  . Highest education level: Not on file  Occupational History  . Not on file  Social Needs  . Financial resource strain: Not on file  . Food insecurity:    Worry: Not on file    Inability: Not on file  . Transportation needs:    Medical: Not on file    Non-medical: Not on file  Tobacco Use  . Smoking status: Never Smoker  . Smokeless tobacco: Never Used  Substance and Sexual Activity  . Alcohol use: No  . Drug use: No  . Sexual activity: Not Currently    Birth control/protection: Surgical  Lifestyle  . Physical activity:    Days per week: Not on file    Minutes per session: Not on file  . Stress: Not on file  Relationships  . Social connections:    Talks on phone: Not on file    Gets together: Not on file    Attends  religious service: Not on file    Active member of club or organization: Not on file    Attends meetings of clubs or organizations: Not on file    Relationship status: Not on file  . Intimate partner violence:    Fear of current or ex partner: Not on file    Emotionally abused: Not on file    Physically abused: Not on file    Forced sexual activity: Not on file  Other Topics Concern  . Not on file  Social History Narrative  . Not on file    Outpatient Medications Prior to Visit  Medication Sig Dispense Refill  . clotrimazole (LOTRIMIN) 1 % cream Apply 1 application topically 2 (two) times daily. 30 g 3  . EPINEPHrine (EPIPEN) 0.3 mg/0.3 mL IJ SOAJ injection Inject 0.3 mLs (0.3 mg total) into the muscle once as needed (for allergic reaction). 1 Device 1  . fluticasone (FLONASE) 50 MCG/ACT nasal spray Place 2 sprays into both nostrils daily. 1 g 0  . rosuvastatin  (CRESTOR) 10 MG tablet Take 1 tablet (10 mg total) by mouth daily. For cholesterol (Patient not taking: Reported on 11/25/2017) 90 tablet 3  . metFORMIN (GLUCOPHAGE-XR) 500 MG 24 hr tablet Take 1 tablet (500 mg total) by mouth daily with breakfast. Repeat labs in 3 months. (Patient not taking: Reported on 11/25/2017) 90 tablet 0   No facility-administered medications prior to visit.     Allergies  Allergen Reactions  . Bee Venom Anaphylaxis    Review of Systems  Constitutional: Positive for malaise/fatigue. Negative for fever.  HENT: Negative for congestion.   Eyes: Negative for blurred vision.  Respiratory: Negative for shortness of breath.   Cardiovascular: Negative for chest pain, palpitations and leg swelling.  Gastrointestinal: Positive for diarrhea. Negative for abdominal pain, blood in stool and nausea.  Genitourinary: Negative for dysuria and frequency.  Musculoskeletal: Negative for falls.  Skin: Positive for rash.  Neurological: Negative for dizziness, loss of consciousness and headaches.  Endo/Heme/Allergies: Negative for environmental allergies.  Psychiatric/Behavioral: Negative for depression. The patient is not nervous/anxious.        Objective:    Physical Exam  Constitutional: She is oriented to person, place, and time. She appears well-developed and well-nourished. No distress.  HENT:  Head: Normocephalic and atraumatic.  Nose: Nose normal.  Eyes: Right eye exhibits no discharge. Left eye exhibits no discharge.  Neck: Normal range of motion. Neck supple.  Cardiovascular: Normal rate and regular rhythm.  No murmur heard. Pulmonary/Chest: Effort normal and breath sounds normal.  Abdominal: Soft. Bowel sounds are normal. There is no tenderness.  Musculoskeletal: She exhibits no edema.  Neurological: She is alert and oriented to person, place, and time.  Skin: Skin is warm and dry.  Psychiatric: She has a normal mood and affect.  Nursing note and vitals  reviewed.   BP 120/84   Pulse 84   Temp 98.1 F (36.7 C) (Oral)   Resp 16   Ht 5\' 4"  (1.626 m)   Wt 208 lb (94.3 kg)   LMP 02/06/2014   SpO2 98%   BMI 35.70 kg/m  Wt Readings from Last 3 Encounters:  11/25/17 208 lb (94.3 kg)  11/10/17 208 lb (94.3 kg)  03/28/17 203 lb (92.1 kg)     Lab Results  Component Value Date   WBC 7.5 11/10/2017   HGB 13.2 11/10/2017   HCT 39.4 11/10/2017   PLT 370.0 11/10/2017   GLUCOSE 106 (H) 11/10/2017   CHOL  195 11/10/2017   TRIG 333.0 (H) 11/10/2017   HDL 42.20 11/10/2017   LDLDIRECT 117.0 11/10/2017   ALT 17 11/10/2017   AST 19 11/10/2017   NA 139 11/10/2017   K 3.7 11/10/2017   CL 101 11/10/2017   CREATININE 0.76 11/10/2017   BUN 11 11/10/2017   CO2 28 11/10/2017   TSH 0.97 11/10/2017   HGBA1C 7.6 (H) 11/10/2017   MICROALBUR 1.8 11/10/2017    Lab Results  Component Value Date   TSH 0.97 11/10/2017   Lab Results  Component Value Date   WBC 7.5 11/10/2017   HGB 13.2 11/10/2017   HCT 39.4 11/10/2017   MCV 81.9 11/10/2017   PLT 370.0 11/10/2017   Lab Results  Component Value Date   NA 139 11/10/2017   K 3.7 11/10/2017   CO2 28 11/10/2017   GLUCOSE 106 (H) 11/10/2017   BUN 11 11/10/2017   CREATININE 0.76 11/10/2017   BILITOT 0.3 11/10/2017   ALKPHOS 88 11/10/2017   AST 19 11/10/2017   ALT 17 11/10/2017   PROT 8.0 11/10/2017   ALBUMIN 4.4 11/10/2017   CALCIUM 9.4 11/10/2017   ANIONGAP 13 02/27/2017   GFR 106.36 11/10/2017   Lab Results  Component Value Date   CHOL 195 11/10/2017   Lab Results  Component Value Date   HDL 42.20 11/10/2017   [facility] results found for: [facility] Lab Results  Component Value Date   TRIG 333.0 (H) 11/10/2017   Lab Results  Component Value Date   CHOLHDL 5 11/10/2017   Lab Results  Component Value Date   HGBA1C 7.6 (H) 11/10/2017       Assessment & Plan:   Problem List Items Addressed This Visit    Controlled type 2 diabetes mellitus without complication, without  long-term current use of insulin (HCC)    Her pharmacy switched her from one manufacturer to another (pills went from oblong to circular) but her prescription for Metformin XR 500 mg daily stayed the same. A few days later she developed a rash on her face, diarrhea and some swelling of her upper lip. She stopped the med and this resolved spontaneously. She was started on Crestor at the same time so it is unclear which med caused the reaction. She agrees to return to her pharmacy and have them check which manufacturer makes the round pill that she was taking when the symptoms started and also to find out the manufacturer of the oblong tab she tolerated. She is given a prescription to fill for the oblong tab if they are able to find it. If she tolerates that she can then restart Crestor at a 1/2 tab daily and see if symptoms return. She will have Benadryl on hand and will seek care if symptoms return      Relevant Medications   metFORMIN (GLUCOPHAGE-XR) 500 MG 24 hr tablet   Hyperlipidemia    She was started on Rosuvastatin 10 mg at the same time her pharmacy switched her Metformin XR to another manufacturer. Then over the next day she developed a facial rash and swelling of upper lip as well as diarrhea. She will change back the Metformin XR previous manufacturer if able and if this is tolerated then can try 1/2 tab of Crestor daily. If symptoms return then take Benadryl, seek care if symptoms worsen. Spent 30 minutes on counseling and exam.          I am having Renee HawthorneNatashi R. Hirata "Tasha" maintain her fluticasone, clotrimazole, rosuvastatin, EPINEPHrine,  and metFORMIN.  Meds ordered this encounter  Medications  . metFORMIN (GLUCOPHAGE-XR) 500 MG 24 hr tablet    Sig: Take 1 tablet (500 mg total) by mouth daily with breakfast. Repeat labs in 3 months.    Dispense:  90 tablet    Refill:  0     Danise Edge, MD

## 2017-11-25 NOTE — Assessment & Plan Note (Signed)
Her pharmacy switched her from one manufacturer to another (pills went from oblong to circular) but her prescription for Metformin XR 500 mg daily stayed the same. A few days later she developed a rash on her face, diarrhea and some swelling of her upper lip. She stopped the med and this resolved spontaneously. She was started on Crestor at the same time so it is unclear which med caused the reaction. She agrees to return to her pharmacy and have them check which manufacturer makes the round pill that she was taking when the symptoms started and also to find out the manufacturer of the oblong tab she tolerated. She is given a prescription to fill for the oblong tab if they are able to find it. If she tolerates that she can then restart Crestor at a 1/2 tab daily and see if symptoms return. She will have Benadryl on hand and will seek care if symptoms return

## 2017-11-25 NOTE — Patient Instructions (Addendum)
Try to switch back to the old Metformin ER tablet from previous manufacturer. If no reaction after a week then try 1/2  Crestor tab if any reaction at all take Benadryl and do not take any further tabs and let us know. Take during the day   Cholesterol Cholesterol is a white, waxy, fat-like substance that is needed by the human body in small amounts. The liver makes all the cholesterol we need. Cholesterol is carried from the liver by the blood through the blood vessels. Deposits of cholesterol (plaques) may build up on blood vessel (artery) walls. Plaques make the arteries narrower and stiffer. Cholesterol plaques increase the risk for heart attack and stroke. You cannot feel your cholesterol level even if it is very high. The only way to know that it is high is to have a blood test. Once you know your cholesterol levels, you should keep a record of the test results. Work with your health care provider to keep your levels in the desired range. What do the results mean?  Total cholesterol is a rough measure of all the cholesterol in your blood.  LDL (low-density lipoprotein) is the "bad" cholesterol. This is the type that causes plaque to build up on the artery walls. You want this level to be low.  HDL (high-density lipoprotein) is the "good" cholesterol because it cleans the arteries and carries the LDL away. You want this level to be high.  Triglycerides are fat that the body can either burn for energy or store. High levels are closely linked to heart disease. What are the desired levels of cholesterol?  Total cholesterol below 200.  LDL below 100 for people who are at risk, below 70 for people at very high risk.  HDL above 40 is good. A level of 60 or higher is considered to be protective against heart disease.  Triglycerides below 150. How can I lower my cholesterol? Diet Follow your diet program as told by your health care provider.  Choose fish or white meat chicken and [place],  roasted or baked. Limit fatty cuts of red meat, fried foods, and processed meats, such as sausage and lunch meats.  Eat lots of fresh fruits and vegetables.  Choose whole grains, beans, pasta, potatoes, and cereals.  Choose olive oil, corn oil, or canola oil, and use only small amounts.  Avoid butter, mayonnaise, shortening, or palm kernel oils.  Avoid foods with trans fats.  Drink skim or nonfat milk and eat low-fat or nonfat yogurt and cheeses. Avoid whole milk, cream, ice cream, egg yolks, and full-fat cheeses.  Healthier desserts include angel food cake, ginger snaps, animal crackers, hard candy, popsicles, and low-fat or nonfat frozen yogurt. Avoid pastries, cakes, pies, and cookies.  Exercise  Follow your exercise program as told by your health care provider. A regular program: ? Helps to decrease LDL and raise HDL. ? Helps with weight control.  Do things that increase your activity level, such as gardening, walking, and taking the stairs.  Ask your health care provider about ways that you can be more active in your daily life.  Medicine  Take over-the-counter and prescription medicines only as told by your health care provider. ? Medicine may be prescribed by your health care provider to help lower cholesterol and decrease the risk for heart disease. This is usually done if diet and exercise have failed to bring down cholesterol levels. ? If you have several risk factors, you may need medicine even if your levels are normal.  This information is not intended to replace advice given to you by your health care provider. Make sure you discuss any questions you have with your health care provider. Document Released: 02/01/2001 Document Revised: 12/05/2015 Document Reviewed: 11/07/2015 Elsevier Interactive Patient Education  [employer].

## 2017-11-25 NOTE — Assessment & Plan Note (Signed)
She was started on Rosuvastatin 10 mg at the same time her pharmacy switched her Metformin XR to another manufacturer. Then over the next day she developed a facial rash and swelling of upper lip as well as diarrhea. She will change back the Metformin XR previous manufacturer if able and if this is tolerated then can try 1/2 tab of Crestor daily. If symptoms return then take Benadryl, seek care if symptoms worsen. Spent 30 minutes on counseling and exam.

## 2017-12-09 ENCOUNTER — Emergency Department (HOSPITAL_COMMUNITY): Payer: Managed Care, Other (non HMO)

## 2017-12-09 ENCOUNTER — Encounter (HOSPITAL_COMMUNITY): Payer: Self-pay | Admitting: Emergency Medicine

## 2017-12-09 ENCOUNTER — Other Ambulatory Visit: Payer: Self-pay

## 2017-12-09 ENCOUNTER — Emergency Department (HOSPITAL_COMMUNITY)
Admission: EM | Admit: 2017-12-09 | Discharge: 2017-12-09 | Disposition: A | Discharge status: Home / Self Care | Payer: Managed Care, Other (non HMO) | Attending: Emergency Medicine | Admitting: Emergency Medicine

## 2017-12-09 DIAGNOSIS — Z7984 Long term (current) use of oral hypoglycemic drugs: Secondary | ICD-10-CM | POA: Insufficient documentation

## 2017-12-09 DIAGNOSIS — M79641 Pain in right hand: Secondary | ICD-10-CM | POA: Insufficient documentation

## 2017-12-09 DIAGNOSIS — E119 Type 2 diabetes mellitus without complications: Secondary | ICD-10-CM | POA: Insufficient documentation

## 2017-12-09 DIAGNOSIS — Z79899 Other long term (current) drug therapy: Secondary | ICD-10-CM | POA: Insufficient documentation

## 2017-12-09 DIAGNOSIS — W010XXA Fall on same level from slipping, tripping and stumbling without subsequent striking against object, initial encounter: Secondary | ICD-10-CM | POA: Diagnosis not present

## 2017-12-09 NOTE — Discharge Instructions (Signed)
X-ray was reassuring, no broken bones.  Please take 600 mg ibuprofen every 6 hours as needed for pain.  Apply ice twice a day for 15 minutes at a time.  No heavy lifting or strenuous activity until your pain is improving.  You can wear the brace for support.  Follow-up with your regular doctor in a week if your symptoms are not improving.  It was a pleasure taking care of you, have a great birthday.

## 2017-12-09 NOTE — ED Triage Notes (Signed)
Pt reports falling while skating last night, injuring R hand in fall.  Swelling noted to hand between 5th finger and wrist. .

## 2017-12-09 NOTE — ED Provider Notes (Signed)
[facility] EMERGENCY DEPARTMENT Provider Note   CSN: [id number] Arrival date & time: 12/09/17  9147     History   Chief Complaint Chief Complaint  Patient presents with  . Hand Injury    HPI Renee Knapp is a 42 y.o. female.  HPI  Renee Knapp is a 42yo female with history of diabetes, and obesity for evaluation of right wrist pain after falling yesterday at a skating rink.  Patient reports that she lost her balance and fell with arms outstretched in front of her.  She denies hitting her head or loss of consciousness.  States that since the fall she has had right ulnar hand pain as well as right wrist pain.  She reports pain is 8/10 in severity, feels sharp and is worsened with trying to use that hand when she is pulling up her pants or putting the car into reverse.  She tried applying BenGay cream as well as ice and heat without significant improvement.  She denies fevers, chills, break in skin, numbness, weakness, arthralgias elsewhere.  Denies prior surgeries to the hand.   Past Medical History:  Diagnosis Date  . Anemia    History  . Diabetes mellitus without complication (HCC)    diet and exercise controlled - no meds  . Obesity   . Plantar fasciitis, bilateral    History - recvd cortisone injections bilateral- no current prob as 01/2014  . SVD (spontaneous vaginal delivery)    x 2    Patient Active Problem List   Diagnosis Date Noted  . Hyperlipidemia 11/25/2017  . Controlled type 2 diabetes mellitus without complication, without long-term current use of insulin (HCC) 11/10/2017  . Acute pericarditis 09/01/2015  . S/P laparoscopic assisted vaginal hysterectomy (LAVH) 02/24/2014    Past Surgical History:  Procedure Laterality Date  . CYSTOSCOPY N/A 02/24/2014   Procedure: CYSTOSCOPY;  Surgeon: Essie Hart, MD;  Location: [facility];  Service: Gynecology;  Laterality: N/A;  . DILATION AND CURETTAGE OF UTERUS     MAB  . LAPAROSCOPIC ASSISTED  VAGINAL HYSTERECTOMY N/A 02/24/2014   Procedure: LAPAROSCOPIC ASSISTED VAGINAL HYSTERECTOMY WITH BILATERAL SALPINGECTOMY ;  Surgeon: Essie Hart, MD;  Location: [facility];  Service: Gynecology;  Laterality: N/A;  . TUBAL LIGATION    . WISDOM TOOTH EXTRACTION       OB History    Gravida  3   Para      Term      Preterm      AB  1   Living  2     SAB  1   TAB      Ectopic      Multiple      Live Births               Home Medications    Prior to Admission medications   Medication Sig Start Date End Date Taking? Authorizing Provider  clotrimazole (LOTRIMIN) 1 % cream Apply 1 application topically 2 (two) times daily. 11/10/17   [name], MD  EPINEPHrine (EPIPEN) 0.3 mg/0.3 mL IJ SOAJ injection Inject 0.3 mLs (0.3 mg total) into the muscle once as needed (for allergic reaction). 11/22/17   [name], MD  fluticasone (FLONASE) 50 MCG/ACT nasal spray Place 2 sprays into both nostrils daily. 06/23/17   Cathie Hoops, Amy V, PA-C  metFORMIN (GLUCOPHAGE-XR) 500 MG 24 hr tablet Take 1 tablet (500 mg total) by mouth daily with breakfast. Repeat labs in 3 months. 11/25/17   Danise Edge  A, MD  rosuvastatin (CRESTOR) 10 MG tablet Take 1 tablet (10 mg total) by mouth daily. For cholesterol Patient not taking: Reported on 11/25/2017 11/13/17   Orland MustardWolfe, Allison, MD    Family History Family History  Problem Relation Age of Onset  . Cancer Other   . Hypertension Other   . Diabetes Other   . Diabetes Mother   . Heart disease Mother   . Hypertension Mother   . Hypertension Father   . High Cholesterol Father   . Hypertension Sister   . Kidney disease Sister   . Stroke Sister   . Hypertension Sister   . Hearing loss Son     Social History Social History   Tobacco Use  . Smoking status: Never Smoker  . Smokeless tobacco: Never Used  Substance Use Topics  . Alcohol use: No  . Drug use: No     Allergies   Bee venom   Review of Systems Review of Systems  Constitutional:  Negative for chills and fever.  Musculoskeletal: Positive for arthralgias (right wrist and hand). Negative for gait problem.  Skin: Negative for color change, rash and wound.  Neurological: Negative for weakness and numbness.     Physical Exam Updated Vital Signs BP 134/79 (BP Location: Right Arm)   Pulse 88   Temp 98.8 F (37.1 C) (Oral)   Resp 16   LMP 02/06/2014   SpO2 97%   Physical Exam  Constitutional: She is oriented to person, place, and time. She appears well-developed and well-nourished. No distress.  Sitting at bedside in no apparent distress, nontoxic-appearing.  HENT:  Head: Normocephalic and atraumatic.  Eyes: Right eye exhibits no discharge. Left eye exhibits no discharge.  Pulmonary/Chest: Effort normal. No respiratory distress.  Musculoskeletal:  Right hand with mild swelling and tenderness over the fourth and fifth metacarpal.  No swelling over the wrist, but tender over the ulnar aspect of the wrist. No erythema, warmth, ecchymosis, break in skin. Full ROM of the wrist, although painful with wrist extension. Able to make a fist without difficulty. No tenderness over the fingers. There is no anatomic snuff box tenderness. Normal sensation and motor function in the median, ulnar, and radial nerve distributions. 2+ radial pulse.   Neurological: She is alert and oriented to person, place, and time. Coordination normal.  Skin: Skin is warm and dry. Capillary refill takes less than 2 seconds. She is not diaphoretic.  Psychiatric: She has a normal mood and affect. Her behavior is normal.  Nursing note and vitals reviewed.    ED Treatments / Results  Labs (all labs ordered are listed, but only abnormal results are displayed) Labs Reviewed - No data to display  EKG None  Radiology Dg Hand Complete Right  Result Date: 12/09/2017 CLINICAL DATA:  Right hand pain after fall while skating yesterday. EXAM: RIGHT HAND - COMPLETE 3+ VIEW COMPARISON:  Right wrist x-rays  dated August 27, 2017. FINDINGS: There is no evidence of fracture or dislocation. There is no evidence of arthropathy or other focal bone abnormality. Unchanged ulnar minus variance. Soft tissues are unremarkable. IMPRESSION: Negative. Electronically Signed   By: Obie DredgeWilliam T Derry M.D.   On: 12/09/2017 10:05    Procedures Procedures (including critical care time)  Medications Ordered in ED Medications - No data to display   Initial Impression / Assessment and Plan / ED Course  I have reviewed the triage vital signs and the nursing notes.  Pertinent labs & imaging results that were available during my  care of the patient were reviewed by me and considered in my medical decision making (see chart for details).    Right hand x-ray without acute fracture or abnormality.  Hand is neurovascularly intact on exam.  No erythema, warmth, break in skin or signs of infection.  Discussed use for pain and RICE protocol.  Her wrist was placed in a Velcro brace here in the ER.  I have counseled her to follow-up with her PCP if her symptoms are not improving in a week.  Counseled her on return for questions and she agrees.  Final Clinical Impressions(s) / ED Diagnoses   Final diagnoses:  Right hand pain    ED Discharge Orders    None       Lawrence Marseilles 12/09/17 1103    Jacalyn Lefevre, MD 12/09/17 1114

## 2017-12-12 ENCOUNTER — Emergency Department (HOSPITAL_COMMUNITY)
Admission: EM | Admit: 2017-12-12 | Discharge: 2017-12-12 | Disposition: A | Discharge status: Home / Self Care | Payer: Managed Care, Other (non HMO) | Attending: Emergency Medicine | Admitting: Emergency Medicine

## 2017-12-12 ENCOUNTER — Emergency Department (HOSPITAL_COMMUNITY): Payer: Managed Care, Other (non HMO)

## 2017-12-12 ENCOUNTER — Encounter (HOSPITAL_COMMUNITY): Payer: Self-pay | Admitting: *Deleted

## 2017-12-12 ENCOUNTER — Other Ambulatory Visit: Payer: Self-pay

## 2017-12-12 DIAGNOSIS — Z79899 Other long term (current) drug therapy: Secondary | ICD-10-CM | POA: Insufficient documentation

## 2017-12-12 DIAGNOSIS — M25531 Pain in right wrist: Secondary | ICD-10-CM | POA: Insufficient documentation

## 2017-12-12 DIAGNOSIS — E119 Type 2 diabetes mellitus without complications: Secondary | ICD-10-CM | POA: Insufficient documentation

## 2017-12-12 NOTE — Discharge Instructions (Addendum)
Your x-ray was reassuring today.  No signs of fracture.  Wear the splint at all times until follow-up with the orthopedic doctor.  Continue Motrin and Tylenol for pain. Apply a compressive ACE bandage. Rest and elevate the affected painful area.  Apply cold compresses intermittently as needed.  As pain recedes, begin normal activities slowly as tolerated.  Call if symptoms persist.

## 2017-12-12 NOTE — ED Triage Notes (Signed)
Pt reports she fell while skating on Friday, c/o pain in the right wrist. Evaluated in the ED for the same, still having pain in the wrist. Has been using wrist brace, compresses and meds without relief.

## 2017-12-12 NOTE — ED Provider Notes (Signed)
[facility]-EMERGENCY DEPT Provider Note   CSN: [id number] Arrival date & time: 12/12/17  1933     History   Chief Complaint Chief Complaint  Patient presents with  . Wrist Pain    HPI Renee Knapp is a 42 y.o. female.  HPI 42 year old female presents to the ED for ongoing right wrist pain after mechanical Knapp 4 days ago.  Seen in ED with imaging at that time that was reassuring.  Patient has been doing rice therapy, Motrin and Tylenol at home with little relief.  Reports ongoing pain is worse with range of motion and palpation.  Has not followed up with her primary care doctor or orthopedic doctor.  She may have an occult fracture.  Denies any paresthesias or weakness. Past Medical History:  Diagnosis Date  . Anemia    History  . Diabetes mellitus without complication (HCC)    diet and exercise controlled - no meds  . Obesity   . Plantar fasciitis, bilateral    History - recvd cortisone injections bilateral- no current prob as 01/2014  . SVD (spontaneous vaginal delivery)    x 2    Patient Active Problem List   Diagnosis Date Noted  . Hyperlipidemia 11/25/2017  . Controlled type 2 diabetes mellitus without complication, without long-term current use of insulin (HCC) 11/10/2017  . Acute pericarditis 09/01/2015  . S/P laparoscopic assisted vaginal hysterectomy (LAVH) 02/24/2014    Past Surgical History:  Procedure Laterality Date  . CYSTOSCOPY N/A 02/24/2014   Procedure: CYSTOSCOPY;  Surgeon: Essie HartWalda Pinn, MD;  Location: [facility];  Service: Gynecology;  Laterality: N/A;  . DILATION AND CURETTAGE OF UTERUS     MAB  . LAPAROSCOPIC ASSISTED VAGINAL HYSTERECTOMY N/A 02/24/2014   Procedure: LAPAROSCOPIC ASSISTED VAGINAL HYSTERECTOMY WITH BILATERAL SALPINGECTOMY ;  Surgeon: Essie HartWalda Pinn, MD;  Location: [facility];  Service: Gynecology;  Laterality: N/A;  . TUBAL LIGATION    . WISDOM TOOTH EXTRACTION       OB History    Gravida  3   Para      Term      Preterm      AB  1   Living  2     SAB  1   TAB      Ectopic      Multiple      Live Births               Home Medications    Prior to Admission medications   Medication Sig Start Date End Date Taking? Authorizing Provider  clotrimazole (LOTRIMIN) 1 % cream Apply 1 application topically 2 (two) times daily. 11/10/17   Orland MustardWolfe, Allison, MD  EPINEPHrine (EPIPEN) 0.3 mg/0.3 mL IJ SOAJ injection Inject 0.3 mLs (0.3 mg total) into the muscle once as needed (for allergic reaction). 11/22/17   Orland MustardWolfe, Allison, MD  fluticasone (FLONASE) 50 MCG/ACT nasal spray Place 2 sprays into both nostrils daily. 06/23/17   Cathie HoopsYu, Amy V, PA-C  metFORMIN (GLUCOPHAGE-XR) 500 MG 24 hr tablet Take 1 tablet (500 mg total) by mouth daily with breakfast. Repeat labs in 3 months. 11/25/17   Bradd CanaryBlyth, Stacey A, MD  rosuvastatin (CRESTOR) 10 MG tablet Take 1 tablet (10 mg total) by mouth daily. For cholesterol Patient not taking: Reported on 11/25/2017 11/13/17   Orland MustardWolfe, Allison, MD    Family History Family History  Problem Relation Age of Onset  . Cancer Other   . Hypertension Other   . Diabetes Other   .  Diabetes Mother   . Heart disease Mother   . Hypertension Mother   . Hypertension Father   . High Cholesterol Father   . Hypertension Sister   . Kidney disease Sister   . Stroke Sister   . Hypertension Sister   . Hearing loss Son     Social History Social History   Tobacco Use  . Smoking status: Never Smoker  . Smokeless tobacco: Never Used  Substance Use Topics  . Alcohol use: No  . Drug use: No     Allergies   Bee venom   Review of Systems Review of Systems  All other systems reviewed and are negative.    Physical Exam Updated Vital Signs BP (!) 148/87 (BP Location: Left Arm)   Pulse (!) 104   Temp 98.4 F (36.9 C) (Oral)   Resp 20   LMP 02/06/2014   SpO2 100%   Physical Exam  Constitutional: She appears well-developed and well-nourished. No distress.  HENT:  Head:  Normocephalic and atraumatic.  Eyes: Right eye exhibits no discharge. Left eye exhibits no discharge. No scleral icterus.  Neck: Normal range of motion.  Pulmonary/Chest: No respiratory distress.  Musculoskeletal: Normal range of motion.  Pain to palpation over the scaphoid region.  Does have range of motion because of pain to the right wrist.  There is no obvious deformity, crepitus, erythema or edema noted.  Patient has good opposition of the thumb.  Brisk cap refill.  Radial pulses 2+ bilaterally.  Skin compartments are soft.  Grip strength equal bilaterally.  Full range motion of all joints of the other extremities reassuring.  Neurological: She is alert.  Skin: Skin is warm and dry. Capillary refill takes less than 2 seconds. No pallor.  Psychiatric: Her behavior is normal. Judgment and thought content normal.  Nursing note and vitals reviewed.    ED Treatments / Results  Labs (all labs ordered are listed, but only abnormal results are displayed) Labs Reviewed - No data to display  EKG None  Radiology Dg Wrist Complete Right  Result Date: 12/12/2017 CLINICAL DATA:  Fall with continued pain EXAM: RIGHT WRIST - COMPLETE 3+ VIEW COMPARISON:  12/09/2017, 08/27/2017 FINDINGS: There is no evidence of fracture or dislocation. There is no evidence of arthropathy or other focal bone abnormality. Soft tissues are unremarkable. IMPRESSION: Negative. Electronically Signed   By: Jasmine Pang M.D.   On: 12/12/2017 22:44    Procedures Procedures (including critical care time)  Medications Ordered in ED Medications - No data to display   Initial Impression / Assessment and Plan / ED Course  I have reviewed the triage vital signs and the nursing notes.  Pertinent labs & imaging results that were available during my care of the patient were reviewed by me and considered in my medical decision making (see chart for details).     Patient X-Ray negative for obvious fracture or dislocation.  Pain managed in ED. patient does have pain of the scaphoid region.  Will place in thumb spica follow-up with orthopedics.  Pt advised to follow up with orthopedics if symptoms persist for possibility of missed fracture diagnosis. Patient given brace while in ED, conservative therapy recommended and discussed. Patient will be dc home & is agreeable with above plan.   Final Clinical Impressions(s) / ED Diagnoses   Final diagnoses:  Right wrist pain    ED Discharge Orders    None       Rise Mu, Cordelia Poche 12/12/17 2306  Shaune Pollack, MD 12/12/17 [phone]

## 2017-12-20 ENCOUNTER — Encounter (INDEPENDENT_AMBULATORY_CARE_PROVIDER_SITE_OTHER): Payer: Self-pay | Admitting: Orthopaedic Surgery

## 2017-12-20 ENCOUNTER — Ambulatory Visit (INDEPENDENT_AMBULATORY_CARE_PROVIDER_SITE_OTHER): Payer: Managed Care, Other (non HMO) | Admitting: Orthopaedic Surgery

## 2017-12-20 DIAGNOSIS — M79641 Pain in right hand: Secondary | ICD-10-CM | POA: Diagnosis not present

## 2017-12-20 NOTE — Progress Notes (Signed)
Office Visit Note   Patient: Renee Knapp           Date of Birth: [birth year]           MRN: [id number] Visit Date: 12/20/2017              Requested by: Orland MustardWolfe, Allison, MD [address] [place], [place] [address] PCP: Orland MustardWolfe, Allison, MD   Assessment & Plan: Visit Diagnoses:  1. Pain of right hand     Plan: Impression is right hand contusion.  Recommend giving this more time and symptomatic treatment.  She may wear the brace as needed.  Ice and elevate as needed.  Patient instructed to follow-up if she does not see any improvement over the next 4 to 6 weeks.  Follow-up as needed.  Follow-Up Instructions: Return if symptoms worsen or fail to improve.   Orders:  No orders of the defined types were placed in this encounter.  No orders of the defined types were placed in this encounter.     Procedures: No procedures performed   Clinical Data: No additional findings.   Subjective: No chief complaint on file.   Patient is a 42 year old right-hand-dominant female who fell directly on her outstretched hand on 12/09/2017 while rollerskating.  She continued to have pain and re-presented to the ED 3 days later.  X-rays have been negative of the hand and wrist.  She denies any numbness and tingling.  Her pain localizes to the base of the palm.  She denies any swelling.  She does endorse some popping that is painless.   Review of Systems  Constitutional: Negative.   HENT: Negative.   Eyes: Negative.   Respiratory: Negative.   Cardiovascular: Negative.   Endocrine: Negative.   Musculoskeletal: Negative.   Neurological: Negative.   Hematological: Negative.   Psychiatric/Behavioral: Negative.   All other systems reviewed and are negative.    Objective: Vital Signs: LMP 02/06/2014   Physical Exam  Constitutional: She is oriented to person, place, and time. She appears well-developed and well-nourished.  HENT:  Head: Normocephalic and atraumatic.  Eyes: EOM are  normal.  Neck: Neck supple.  Pulmonary/Chest: Effort normal.  Abdominal: Soft.  Neurological: She is alert and oriented to person, place, and time.  Skin: Skin is warm. Capillary refill takes less than 2 seconds.  Psychiatric: She has a normal mood and affect. Her behavior is normal. Judgment and thought content normal.  Nursing note and vitals reviewed.   Ortho Exam Right hand and wrist exam shows no tenderness of the anatomic snuffbox.  No tenderness in the SL and LT interval.  She mainly has tenderness along the scaphoid tubercle and base of the palm and is a form.  She has normal range of motion without pain.  She has good grip strength. Specialty Comments:  No specialty comments available.  Imaging: No results found.   PMFS History: Patient Active Problem List   Diagnosis Date Noted  . Hyperlipidemia 11/25/2017  . Controlled type 2 diabetes mellitus without complication, without long-term current use of insulin (HCC) 11/10/2017  . Acute pericarditis 09/01/2015  . S/P laparoscopic assisted vaginal hysterectomy (LAVH) 02/24/2014   Past Medical History:  Diagnosis Date  . Anemia    History  . Diabetes mellitus without complication (HCC)    diet and exercise controlled - no meds  . Obesity   . Plantar fasciitis, bilateral    History - recvd cortisone injections bilateral- no current prob as 01/2014  . SVD (  spontaneous vaginal delivery)    x 2    Family History  Problem Relation Age of Onset  . Cancer Other   . Hypertension Other   . Diabetes Other   . Diabetes Mother   . Heart disease Mother   . Hypertension Mother   . Hypertension Father   . High Cholesterol Father   . Hypertension Sister   . Kidney disease Sister   . Stroke Sister   . Hypertension Sister   . Hearing loss Son     Past Surgical History:  Procedure Laterality Date  . CYSTOSCOPY N/A 02/24/2014   Procedure: CYSTOSCOPY;  Surgeon: Essie Hart, MD;  Location: [facility];  Service: Gynecology;   Laterality: N/A;  . DILATION AND CURETTAGE OF UTERUS     MAB  . LAPAROSCOPIC ASSISTED VAGINAL HYSTERECTOMY N/A 02/24/2014   Procedure: LAPAROSCOPIC ASSISTED VAGINAL HYSTERECTOMY WITH BILATERAL SALPINGECTOMY ;  Surgeon: Essie Hart, MD;  Location: [facility];  Service: Gynecology;  Laterality: N/A;  . TUBAL LIGATION    . WISDOM TOOTH EXTRACTION     Social History   Occupational History  . Not on file  Tobacco Use  . Smoking status: Never Smoker  . Smokeless tobacco: Never Used  Substance and Sexual Activity  . Alcohol use: No  . Drug use: No  . Sexual activity: Not Currently    Birth control/protection: Surgical

## 2018-01-12 ENCOUNTER — Encounter: Payer: Self-pay | Admitting: Family Medicine

## 2018-01-12 ENCOUNTER — Ambulatory Visit: Payer: Managed Care, Other (non HMO) | Admitting: Family Medicine

## 2018-01-12 ENCOUNTER — Telehealth: Payer: Self-pay | Admitting: Family Medicine

## 2018-01-12 VITALS — BP 108/84 | HR 73 | Temp 97.9°F | Ht 64.0 in | Wt 209.4 lb

## 2018-01-12 DIAGNOSIS — R110 Nausea: Secondary | ICD-10-CM | POA: Diagnosis not present

## 2018-01-12 DIAGNOSIS — R5383 Other fatigue: Secondary | ICD-10-CM

## 2018-01-12 LAB — COMPREHENSIVE METABOLIC PANEL
ALBUMIN: 4.2 g/dL (ref 3.5–5.2)
ALT: 17 U/L (ref 0–35)
AST: 18 U/L (ref 0–37)
Alkaline Phosphatase: 76 U/L (ref 39–117)
BILIRUBIN TOTAL: 0.3 mg/dL (ref 0.2–1.2)
BUN: 12 mg/dL (ref 6–23)
CALCIUM: 9.9 mg/dL (ref 8.4–10.5)
CHLORIDE: 99 meq/L (ref 96–112)
CO2: 32 mEq/L (ref 19–32)
CREATININE: 0.78 mg/dL (ref 0.40–1.20)
GFR: 103.14 mL/min (ref >60.00)
Glucose, Bld: 115 mg/dL — ABNORMAL HIGH (ref 70–99)
Potassium: 3.8 mEq/L (ref 3.5–5.1)
Sodium: 137 mEq/L (ref 135–145)
Total Protein: 7.9 g/dL (ref 6.0–8.3)

## 2018-01-12 LAB — CBC WITH DIFFERENTIAL/PLATELET
BASOS ABS: 0.1 10*3/uL (ref 0.0–0.1)
Basophils Relative: 0.7 % (ref 0.0–3.0)
Eosinophils Absolute: 0.3 10*3/uL (ref 0.0–0.7)
Eosinophils Relative: 4.3 % (ref 0.0–5.0)
HCT: 39.8 % (ref 36.0–46.0)
Hemoglobin: 13.2 g/dL (ref 12.0–15.0)
LYMPHS ABS: 3.9 10*3/uL (ref 0.7–4.0)
Lymphocytes Relative: 52.6 % — ABNORMAL HIGH (ref 12.0–46.0)
MCHC: 33.1 g/dL (ref 30.0–36.0)
MCV: 81.3 fl (ref 78.0–100.0)
MONO ABS: 0.5 10*3/uL (ref 0.1–1.0)
Monocytes Relative: 7.3 % (ref 3.0–12.0)
NEUTROS ABS: 2.6 10*3/uL (ref 1.4–7.7)
Neutrophils Relative %: 35.1 % — ABNORMAL LOW (ref 43.0–77.0)
PLATELETS: 329 10*3/uL (ref 150.0–400.0)
RBC: 4.89 Mil/uL (ref 3.87–5.11)
RDW: 13.6 % (ref 11.5–15.5)
WBC: 7.4 10*3/uL (ref 4.0–10.5)

## 2018-01-12 LAB — VITAMIN D 25 HYDROXY (VIT D DEFICIENCY, FRACTURES): VITD: 14.79 ng/mL — AB (ref 30.00–100.00)

## 2018-01-12 LAB — TSH: TSH: 1.24 u[IU]/mL (ref 0.35–4.50)

## 2018-01-12 MED ORDER — ONDANSETRON HCL 4 MG PO TABS: 4.0000 mg | ORAL_TABLET | Freq: Three times a day (TID) | ORAL | 0 refills | Status: DC | PRN

## 2018-01-12 NOTE — Progress Notes (Signed)
Patient: Renee Knapp MRN: [id number] DOB: 1976-04-10 PCP: Renee Knapp, Renee Beer, MD     Subjective:  No chief complaint on file.   HPI: The patient is a 42 y.o. female who presents today for fatigue x3 days and nausea that was only this AM. She has had nausea and no vomiting this AM only.  She could tell a noticeable difference 3 days ago with her fatigue. She had energy and could get things done before this. She denies any sick contacts, no fevers, no weight loss, no respiratory symptoms, no diarrhea. No changes prior to this event. She is unsure if she snores at night. She is on metformin, but has been on this in the past. No new medications since I saw her last time. She denies any depression. No joint pain, no tick bites. She states she stumbled twice yesterday, like she was drunk. She did not fall. She had one episode of dizziness yesterday. She is drinking plenty of water. She denies any stomach pain.   Review of Systems  Constitutional: Positive for fatigue. Negative for activity change, appetite change and diaphoresis.  Eyes: Negative for visual disturbance.  Respiratory: Negative for shortness of breath.   Cardiovascular: Negative for chest pain.  Gastrointestinal: Positive for nausea. Negative for abdominal pain.  Endocrine: Negative for cold intolerance, heat intolerance and polydipsia.  Neurological: Negative for dizziness, weakness and headaches.  Psychiatric/Behavioral: The patient is not nervous/anxious.     Allergies Patient is allergic to bee venom.  Past Medical History Patient  has a past medical history of Anemia, Diabetes mellitus without complication (HCC), Obesity, Plantar fasciitis, bilateral, and SVD (spontaneous vaginal delivery).  Surgical History Patient  has a past surgical history that includes Tubal ligation; Dilation and curettage of uterus; Wisdom tooth extraction; Laparoscopic assisted vaginal hysterectomy (N/A, 02/24/2014); and Cystoscopy (N/A,  02/24/2014).  Family History Pateint's family history includes Cancer in her other; Diabetes in her mother and other; Hearing loss in her son; Heart disease in her mother; High Cholesterol in her father; Hypertension in her father, mother, other, sister, and sister; Kidney disease in her sister; Stroke in her sister.  Social History Patient  reports that she has never smoked. She has never used smokeless tobacco. She reports that she does not drink alcohol or use drugs.    Objective: Vitals:   01/12/18 1328  BP: 108/84  Pulse: 73  Temp: 97.9 F (36.6 C)  TempSrc: Oral  SpO2: 96%  Weight: 209 lb 6.4 oz (95 kg)  Height: 5\' 4"  (1.626 m)    Body mass index is 35.94 kg/m.  Physical Exam  Constitutional: She is oriented to person, place, and time. She appears well-developed and well-nourished.  HENT:  Right Ear: External ear normal.  Left Ear: External ear normal.  Mouth/Throat: Oropharynx is clear and moist. No oropharyngeal exudate.  TM pearly with light reflex bilaterally   Eyes: Pupils are equal, round, and reactive to light. EOM are normal.  Neck: No thyromegaly present.  Cardiovascular: Normal rate, regular rhythm and normal heart sounds.  Pulmonary/Chest: Effort normal and breath sounds normal.  Abdominal: Soft. Bowel sounds are normal. She exhibits no distension. There is no tenderness.  Lymphadenopathy:    She has no cervical adenopathy.  Neurological: She is alert and oriented to person, place, and time.  Skin: Skin is warm and dry. No rash noted.  Vitals reviewed.  glucose: 117    Assessment/plan: 1. Other fatigue Checking labs, but feel like this is more of a viral  illness. Should run it's course in 5-7 days. Sugar normal, but giving her a meter to check if feeling bad. Has been on metformin and has been tolerating fine so unlikely cause of nausea. Exam normal. Again, want her to rest, push fluids and we will follow up on labs. Any change or worsening symptoms she  is to let me know.  - CBC with Differential/Platelet - Comprehensive metabolic panel - VITAMIN D 25 Hydroxy (Vit-D Deficiency, Fractures) - TSH  2. Nausea Sugar normal in office. Exam normal. Checking labs, but discussed could just be a viral illness that can last 5-7 days. Rest, fluids and zofran prn. Let me know if not getting better.     Return if symptoms worsen or fail to improve.    Renee Mustard, MD [facility]   01/12/2018

## 2018-01-12 NOTE — Telephone Encounter (Signed)
Please let her know that our front desk let us know that she was asking about her blood pressure.   Let her know it's low, but blood pressure is never too low unless symptomatic. She doesn't have a fast heart rate and she is not light headed/dizzy on standing. She is tired and it could be contributing. I would just make sure drinking water and could keep an eye on it. She is on no medications to adjust. Seh is not far from where she was when I saw her last time either. Looking in her chart she has had multiple readings around what she has had today.

## 2018-01-12 NOTE — Telephone Encounter (Signed)
Called patient's home # and there was no answer.  Unable to leave msg due to no voicemail available.  Will try calling again before end of day.

## 2018-01-12 NOTE — Telephone Encounter (Signed)
Called patient's home # again and there was no answer.  Unable to leave msg due to no answering machine/voicemail available.  Mobile # listed in chart not in service.

## 2018-01-15 ENCOUNTER — Encounter: Payer: Self-pay | Admitting: Family Medicine

## 2018-01-15 NOTE — Telephone Encounter (Signed)
Spoke to patient through MyChart.

## 2018-01-16 NOTE — Telephone Encounter (Signed)
Dr. Durene CalHunter,  Please review labs from 8/23 and advise.  Thanks

## 2018-01-18 ENCOUNTER — Other Ambulatory Visit: Payer: Self-pay | Admitting: Family Medicine

## 2018-01-18 DIAGNOSIS — E559 Vitamin D deficiency, unspecified: Secondary | ICD-10-CM | POA: Insufficient documentation

## 2018-01-18 MED ORDER — VITAMIN D (ERGOCALCIFEROL) 1.25 MG (50000 UNIT) PO CAPS: ORAL_CAPSULE | ORAL | 0 refills | Status: DC

## 2018-01-24 ENCOUNTER — Encounter: Payer: Self-pay | Admitting: Family Medicine

## 2018-01-24 ENCOUNTER — Ambulatory Visit: Payer: Managed Care, Other (non HMO) | Admitting: Family Medicine

## 2018-01-24 VITALS — BP 116/76 | HR 79 | Temp 98.2°F | Ht 64.0 in | Wt 208.2 lb

## 2018-01-24 DIAGNOSIS — E119 Type 2 diabetes mellitus without complications: Secondary | ICD-10-CM

## 2018-01-24 DIAGNOSIS — E782 Mixed hyperlipidemia: Secondary | ICD-10-CM

## 2018-01-24 NOTE — Patient Instructions (Signed)
Health Maintenance Due  Topic Date Due  . FOOT EXAM -done in office today 12/12/1983  . OPHTHALMOLOGY EXAM -pt waiting for c/b to schedule this appt 12/12/1983  . INFLUENZA VACCINE -please call our office to schedule this in October/November 12/21/2017

## 2018-01-24 NOTE — Progress Notes (Signed)
Patient: Renee Knapp MRN: [id number] DOB: 10-Sep-1975 PCP: [name], MD     Subjective:  Chief Complaint  Patient presents with  . Follow-up  . Diabetes    HPI: The patient is a 42 y.o. female who presents today for follow up of diabetes.   Diabetes: Patient is here for follow up of type 2 diabetes. First diagnosed 2016. currently on the following medications metformin. Takes medications as prescribed. Last A1C was 7.6 . Currently exercising and following diabetic diet.  Denies any hypoglycemic events. Denies any vision changes, nausea, vomiting, abdominal pain, ulcers/paraesthesia in feet, polyuria, polydipsia or polyphagia. Denies any chest pain, shortness of breath. I just started her back on her medication daily since she had been off of it. She is a bit early for labs today. Also started statin. Has lost about 4 pounds.    Review of Systems  Constitutional: Positive for fatigue.  Eyes: Negative for visual disturbance.  Respiratory: Negative for cough, shortness of breath and wheezing.        Pt states she heard crackling sound when breathing while she was drinking water today.  She feels fine and is asymptomatic  Cardiovascular: Negative for chest pain.  Gastrointestinal: Negative for abdominal pain and nausea.  Skin: Negative for rash and wound.  Neurological: Negative for dizziness and headaches.    Allergies Patient is allergic to bee venom.  Past Medical History Patient  has a past medical history of Anemia, Diabetes mellitus without complication (HCC), Obesity, Plantar fasciitis, bilateral, and SVD (spontaneous vaginal delivery).  Surgical History Patient  has a past surgical history that includes Tubal ligation; Dilation and curettage of uterus; Wisdom tooth extraction; Laparoscopic assisted vaginal hysterectomy (N/A, 02/24/2014); and Cystoscopy (N/A, 02/24/2014).  Family History Pateint's family history includes Cancer in her other; Diabetes in her mother  and other; Hearing loss in her son; Heart disease in her mother; High Cholesterol in her father; Hypertension in her father, mother, other, sister, and sister; Kidney disease in her sister; Stroke in her sister.  Social History Patient  reports that she has never smoked. She has never used smokeless tobacco. She reports that she does not drink alcohol or use drugs.    Objective: Vitals:   01/24/18 1413  BP: 116/76  Pulse: 79  Temp: 98.2 F (36.8 C)  TempSrc: Oral  SpO2: 98%  Weight: 208 lb 3.2 oz (94.4 kg)  Height: 5\' 4"  (1.626 m)    Body mass index is 35.74 kg/m.  Physical Exam  Constitutional: She is oriented to person, place, and time. She appears well-developed and well-nourished.  Neck: Normal range of motion. No thyromegaly present.  Cardiovascular: Normal rate, regular rhythm and normal heart sounds.  Pulmonary/Chest: Effort normal and breath sounds normal.  Abdominal: Soft. Bowel sounds are normal.  Lymphadenopathy:    She has no cervical adenopathy.  Neurological: She is oriented to person, place, and time.  Skin: Skin is warm. Capillary refill takes less than 2 seconds. No rash noted.  Psychiatric: She has a normal mood and affect. Her behavior is normal.  Vitals reviewed.      Diabetic foot exam done and documented in screening tools. Normal bilaterally.   Assessment/plan: 1. Controlled type 2 diabetes mellitus without complication, without long-term current use of insulin (HCC) Foot exam done today and wnl. Had her pnuemovax and utd on her lab work. Tolerating her metformin fine. She is about 3 weeks early for labs. Will come back for these. Keep working on [employer] loss/diet  and exercise. May have to increase her metformin and she is aware of this. Also careplan done with her today for her job for use of her epipen and hypoglycemia. printed copies given. She will f/u with labs in 3 weeks. a1c will be evaluated and medication adjusted. See her back in 3-6 months.     2. Mixed hyperlipidemia Will come back for labs since early. Tolerating her statin without any issue.      Return in about 6 months (around 07/25/2018).     [name], MD [facility]   01/24/2018

## 2018-02-12 ENCOUNTER — Other Ambulatory Visit (INDEPENDENT_AMBULATORY_CARE_PROVIDER_SITE_OTHER): Payer: Managed Care, Other (non HMO)

## 2018-02-12 DIAGNOSIS — E119 Type 2 diabetes mellitus without complications: Secondary | ICD-10-CM

## 2018-02-12 LAB — HEMOGLOBIN A1C: HEMOGLOBIN A1C: 7.4 % — AB (ref 4.6–6.5)

## 2018-02-12 LAB — COMPREHENSIVE METABOLIC PANEL
ALBUMIN: 4.2 g/dL (ref 3.5–5.2)
ALK PHOS: 76 U/L (ref 39–117)
ALT: 15 U/L (ref 0–35)
AST: 16 U/L (ref 0–37)
BUN: 14 mg/dL (ref 6–23)
CO2: 28 mEq/L (ref 19–32)
Calcium: 9.3 mg/dL (ref 8.4–10.5)
Chloride: 100 mEq/L (ref 96–112)
Creatinine, Ser: 0.77 mg/dL (ref 0.40–1.20)
GFR: 104.65 mL/min (ref >60.00)
Glucose, Bld: 137 mg/dL — ABNORMAL HIGH (ref 70–99)
Potassium: 3.8 mEq/L (ref 3.5–5.1)
SODIUM: 137 meq/L (ref 135–145)
Total Bilirubin: 0.3 mg/dL (ref 0.2–1.2)
Total Protein: 7.8 g/dL (ref 6.0–8.3)

## 2018-02-12 LAB — LIPID PANEL
CHOLESTEROL: 134 mg/dL (ref 0–200)
HDL: 34.3 mg/dL — ABNORMAL LOW (ref >39.00)
NonHDL: 100.15
Total CHOL/HDL Ratio: 4
Triglycerides: 296 mg/dL — ABNORMAL HIGH (ref 0.0–149.0)
VLDL: 59.2 mg/dL — ABNORMAL HIGH (ref 0.0–40.0)

## 2018-02-12 LAB — LDL CHOLESTEROL, DIRECT: Direct LDL: 73 mg/dL

## 2018-02-15 ENCOUNTER — Encounter: Payer: Self-pay | Admitting: Family Medicine

## 2018-02-22 ENCOUNTER — Encounter: Payer: Self-pay | Admitting: Family Medicine

## 2018-02-25 ENCOUNTER — Other Ambulatory Visit: Payer: Self-pay | Admitting: Family Medicine

## 2018-02-26 NOTE — Telephone Encounter (Signed)
Pt has appt scheduled for 10/11 with Dr. Artis Flock.

## 2018-03-02 ENCOUNTER — Encounter: Payer: Self-pay | Admitting: Family Medicine

## 2018-03-02 ENCOUNTER — Ambulatory Visit: Payer: Managed Care, Other (non HMO) | Admitting: Family Medicine

## 2018-03-02 VITALS — BP 126/88 | HR 75 | Temp 98.2°F | Ht 64.0 in | Wt 205.4 lb

## 2018-03-02 DIAGNOSIS — R197 Diarrhea, unspecified: Secondary | ICD-10-CM

## 2018-03-02 DIAGNOSIS — R1110 Vomiting, unspecified: Secondary | ICD-10-CM

## 2018-03-02 DIAGNOSIS — G44209 Tension-type headache, unspecified, not intractable: Secondary | ICD-10-CM

## 2018-03-02 DIAGNOSIS — L219 Seborrheic dermatitis, unspecified: Secondary | ICD-10-CM | POA: Diagnosis not present

## 2018-03-02 DIAGNOSIS — E119 Type 2 diabetes mellitus without complications: Secondary | ICD-10-CM

## 2018-03-02 LAB — POC INFLUENZA A&B (BINAX/QUICKVUE)
INFLUENZA A, POC: NEGATIVE
INFLUENZA B, POC: NEGATIVE

## 2018-03-02 MED ORDER — KETOCONAZOLE 2 % EX CREA: 1.0000 "application " | TOPICAL_CREAM | Freq: Every day | CUTANEOUS | 5 refills | Status: DC

## 2018-03-02 NOTE — Progress Notes (Signed)
Patient: Renee Knapp MRN: [id number] DOB: Aug 06, 1975 PCP: [name], MD     Subjective:  Chief Complaint  Patient presents with  . Headache  . Hyperglycemia    HPI: The patient is a 42 y.o. female who presents today for multiple complaints. She has had a headache since last week. She started to feel sick on Wednesday. She started to have diarrhea on Wednesday into Thursday. She continued to have diarrhea into the night and has had 2 episodes today. She is really achy, chills, possibly a fever. She has had about 6 episodes of diarrhea/day. No blood in stool and no stomach pain. She has no upper respiratory symptoms. 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    Headache: has had since last week over frontal lobe. Has not taken anything for the headache. Does not wake her up, no focal deficits, not worst headache of life. She rates it as a 8/10 and is dull in nature. No photophobia and no phonophobia.   Rash on face: she has a white, scaly/flaky rash over her eyebrows, nose and ears. She has had for a long time.   Review of Systems  Constitutional: Positive for chills and fatigue. Negative for fever.  HENT: Negative for congestion, ear pain, rhinorrhea, sinus pressure, sinus pain and sore throat.   Respiratory: Negative for cough and shortness of breath.   Cardiovascular: Negative for chest pain.  Gastrointestinal: Positive for nausea. Negative for abdominal pain and vomiting.  Musculoskeletal: Positive for back pain and myalgias.       Feels achey all over  Skin: Positive for rash.  Neurological: Positive for headaches. Negative for dizziness.  Psychiatric/Behavioral: Negative for sleep disturbance. The patient is not nervous/anxious.     Allergies Patient is allergic to bee venom.  Past Medical History Patient  has a past medical history of Anemia, Diabetes mellitus without complication (HCC), Obesity, Plantar fasciitis, bilateral, and SVD  (spontaneous vaginal delivery).  Surgical History Patient  has a past surgical history that includes Tubal ligation; Dilation and curettage of uterus; Wisdom tooth extraction; Laparoscopic assisted vaginal hysterectomy (N/A, 02/24/2014); and Cystoscopy (N/A, 02/24/2014).  Family History Pateint's family history includes Cancer in her other; Diabetes in her mother and other; Hearing loss in her son; Heart disease in her mother; High Cholesterol in her father; Hypertension in her father, mother, other, sister, and sister; Kidney disease in her sister; Stroke in her sister.  Social History Patient  reports that she has never smoked. She has never used smokeless tobacco. She reports that she does not drink alcohol or use drugs.    Objective: Vitals:   03/02/18 0820  BP: 126/88  Pulse: 75  Temp: 98.2 F (36.8 C)  TempSrc: Oral  SpO2: 98%  Weight: 205 lb 6.4 oz (93.2 kg)  Height: 5\' 4"  (1.626 m)    Body mass index is 35.26 kg/m.  Physical Exam  Constitutional: She is oriented to person, place, and time. She appears well-developed and well-nourished.  HENT:  Mouth/Throat: Oropharynx is clear and moist.  Tm pearly with light reflex bilaterally    Eyes: Pupils are equal, round, and reactive to light. EOM are normal.  Neck: Normal range of motion. Neck supple.  Cardiovascular: Normal rate, regular rhythm and normal heart sounds.  Pulmonary/Chest: Effort normal.  Abdominal: Soft. Bowel sounds are normal.  Lymphadenopathy:    She has no cervical adenopathy.  Neurological: She is alert and oriented to person, place, and time.  No cranial nerve deficit.  Skin: Skin is warm. Rash (she has a white flaky rash over eyebrows, nasolabial folds, ears bilaterally ) noted.  Psychiatric: She has a normal mood and affect. Her behavior is normal.  Vitals reviewed.     flu: negative   Assessment/plan: 1. Vomiting and diarrhea Viral illness. Continue with ors. Want her to stick with water with  diabetes, but if wants Gatorade can do the G2. If not better in 2 days needs to let me know.  - POC Influenza A&B(BINAX/QUICKVUE)  2. Seborrheic dermatitis Starting selsun daily face wash and then ketoconazole cream daily. Can use hydrocortisone cream as needed if really bad. Discussed course waxes and wanes. likely having flair due to stress.   3. Tension headache Start ibuprofen TID as needed with food. Discussed try to relax, decrease stress if possible. Precautions given for worsening symptoms.   -also want to make sure symptoms not caused by metformin as we have slowly increased her dosage. She will continue to monitor this and let me know.     Return if symptoms worsen or fail to improve.    [name], MD [facility]   03/02/2018

## 2018-03-02 NOTE — Patient Instructions (Addendum)
im sending in a cream called ketoconazole to apply to face/ears. Can use hydrocortisone cream if itching really bad as well. Safe for face. Daily facial washing with selsun is also really effective. Can get selsun over the counter.   Tension headache: can be made worse by stress. Make sure drinking plenty and would take 600-800mg  of ibuprofen with food up to three times a day as needed. Headache can last up to 3 weeks.    Seborrheic Dermatitis, Adult Seborrheic dermatitis is a skin disease that causes red, scaly patches. It usually occurs on the scalp, and it is often called dandruff. The patches may appear on other parts of the body. Skin patches tend to appear where there are many oil glands in the skin. Areas of the body that are commonly affected include:  Scalp.  Skin folds of the body.  Ears.  Eyebrows.  Neck.  Face.  Armpits.  The bearded area of men's faces.  The condition may come and go for no known reason, and it is often long-lasting (chronic). What are the causes? The cause of this condition is not known. What increases the risk? This condition is more likely to develop in people who:  Have certain conditions, such as: ? HIV (human immunodeficiency virus). ? AIDS (acquired immunodeficiency syndrome). ? Parkinson disease. ? Mood disorders, such as depression.  Are 36-68 years old.  What are the signs or symptoms? Symptoms of this condition include:  Thick scales on the scalp.  Redness on the face or in the armpits.  Skin that is flaky. The flakes may be white or yellow.  Skin that seems oily or dry but is not helped with moisturizers.  Itching or burning in the affected areas.  How is this diagnosed? This condition is diagnosed with a medical history and physical exam. A sample of your skin may be tested (skin biopsy). You may need to see a skin specialist (dermatologist). How is this treated? There is no cure for this condition, but treatment can  help to manage the symptoms. You may get treatment to remove scales, lower the risk of skin infection, and reduce swelling or itching. Treatment may include:  Creams that reduce swelling and irritation (steroids).  Creams that reduce skin yeast.  Medicated shampoo, soaps, moisturizing creams, or ointments.  Medicated moisturizing creams or ointments.  Follow these instructions at home:  Apply over-the-counter and prescription medicines only as told by your health care provider.  Use any medicated shampoo, soaps, skin creams, or ointments only as told by your health care provider.  Keep all follow-up visits as told by your health care provider. This is important. Contact a health care provider if:  Your symptoms do not improve with treatment.  Your symptoms get worse.  You have new symptoms. This information is not intended to replace advice given to you by your health care provider. Make sure you discuss any questions you have with your health care provider. Document Released: 05/09/2005 Document Revised: 11/27/2015 Document Reviewed: 08/27/2015 Elsevier Interactive Patient Education  [employer].

## 2018-03-05 ENCOUNTER — Encounter: Payer: Self-pay | Admitting: Family Medicine

## 2018-03-28 ENCOUNTER — Ambulatory Visit (HOSPITAL_COMMUNITY)
Admission: EM | Admit: 2018-03-28 | Discharge: 2018-03-28 | Disposition: A | Discharge status: Home / Self Care | Payer: Managed Care, Other (non HMO)

## 2018-03-29 ENCOUNTER — Other Ambulatory Visit: Payer: Self-pay | Admitting: Family Medicine

## 2018-03-29 ENCOUNTER — Ambulatory Visit: Payer: Self-pay

## 2018-03-29 DIAGNOSIS — M79672 Pain in left foot: Secondary | ICD-10-CM

## 2018-04-01 ENCOUNTER — Ambulatory Visit (HOSPITAL_COMMUNITY)
Admission: EM | Admit: 2018-04-01 | Discharge: 2018-04-01 | Disposition: A | Discharge status: Home / Self Care | Payer: Managed Care, Other (non HMO) | Attending: Family Medicine | Admitting: Family Medicine

## 2018-04-01 ENCOUNTER — Encounter (HOSPITAL_COMMUNITY): Payer: Self-pay | Admitting: Emergency Medicine

## 2018-04-01 DIAGNOSIS — R0981 Nasal congestion: Secondary | ICD-10-CM

## 2018-04-01 DIAGNOSIS — M940 Chondrocostal junction syndrome [Tietze]: Secondary | ICD-10-CM | POA: Diagnosis not present

## 2018-04-01 DIAGNOSIS — R05 Cough: Secondary | ICD-10-CM | POA: Diagnosis not present

## 2018-04-01 DIAGNOSIS — R6889 Other general symptoms and signs: Secondary | ICD-10-CM

## 2018-04-01 MED ORDER — BENZONATATE 100 MG PO CAPS: 100.0000 mg | ORAL_CAPSULE | Freq: Three times a day (TID) | ORAL | 0 refills | Status: DC

## 2018-04-01 MED ORDER — ALBUTEROL SULFATE HFA 108 (90 BASE) MCG/ACT IN AERS
2.0000 | INHALATION_SPRAY | Freq: Once | RESPIRATORY_TRACT | Status: AC
  Administered 2018-04-01: 2 via RESPIRATORY_TRACT

## 2018-04-01 MED ORDER — ALBUTEROL SULFATE HFA 108 (90 BASE) MCG/ACT IN AERS
INHALATION_SPRAY | RESPIRATORY_TRACT | Status: AC
  Filled 2018-04-01: qty 6.7

## 2018-04-01 NOTE — ED Triage Notes (Signed)
Pt sts URI sx with body aches x 1 week

## 2018-04-01 NOTE — ED Provider Notes (Signed)
[facility]   [id number] 04/01/18 Arrival Time: 1032   CC: Flu-like symptoms  SUBJECTIVE: History from: patient.  Renee Knapp is a 42 y.o. female hx significant for DM, who presents with abrupt onset of nasal congestion, runny nose, and productive cough with yellow sputum x 1 week.  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  Has tried OTC medications with minimal relief.  Symptoms are made worse with lying down at night.  Reports previous symptoms in the past and diagnosed with flu.  Complains of subject fever, chills, fatigue, nasal congestion, rhinorrhea, sore throat, possible wheezes, and rib pain associated with cough. Denies SOB, chest pain, nausea, changes in bowel or bladder habits.     Received flu shot this year: yes. On 03/19/18  ROS: As per HPI.  Past Medical History:  Diagnosis Date  . Anemia    History  . Diabetes mellitus without complication (HCC)    diet and exercise controlled - no meds  . Obesity   . Plantar fasciitis, bilateral    History - recvd cortisone injections bilateral- no current prob as 01/2014  . SVD (spontaneous vaginal delivery)    x 2   Past Surgical History:  Procedure Laterality Date  . CYSTOSCOPY N/A 02/24/2014   Procedure: CYSTOSCOPY;  Surgeon: Essie Hart, MD;  Location: [facility];  Service: Gynecology;  Laterality: N/A;  . DILATION AND CURETTAGE OF UTERUS     MAB  . LAPAROSCOPIC ASSISTED VAGINAL HYSTERECTOMY N/A 02/24/2014   Procedure: LAPAROSCOPIC ASSISTED VAGINAL HYSTERECTOMY WITH BILATERAL SALPINGECTOMY ;  Surgeon: Essie Hart, MD;  Location: [facility];  Service: Gynecology;  Laterality: N/A;  . TUBAL LIGATION    . WISDOM TOOTH EXTRACTION     Allergies  Allergen Reactions  . Bee Venom Anaphylaxis   No current facility-administered medications on file prior to encounter.    Current Outpatient Medications on File Prior to Encounter  Medication Sig Dispense Refill  . EPINEPHrine (EPIPEN) 0.3 mg/0.3 mL IJ SOAJ  injection Inject 0.3 mLs (0.3 mg total) into the muscle once as needed (for allergic reaction). 1 Device 1  . fluticasone (FLONASE) 50 MCG/ACT nasal spray Place 2 sprays into both nostrils daily. 1 g 0  . ketoconazole (NIZORAL) 2 % cream Apply 1 application topically daily. 30 g 5  . metFORMIN (GLUCOPHAGE-XR) 500 MG 24 hr tablet TAKE 1 TABLET BY MOUTH ONCE DAILY WITH BREAKFAST. REPEAT IN 3 MONTHS 90 tablet 0  . ondansetron (ZOFRAN) 4 MG tablet Take 1 tablet (4 mg total) by mouth every 8 (eight) hours as needed for nausea or vomiting. 20 tablet 0  . rosuvastatin (CRESTOR) 10 MG tablet Take 1 tablet (10 mg total) by mouth daily. For cholesterol 90 tablet 3  . Vitamin D, Ergocalciferol, (DRISDOL) 50000 units CAPS capsule One capsule by mouth once a week for 12 weeks. Then take 2000IU/day 12 capsule 0   Social History   Socioeconomic History  . Marital status: Divorced    Spouse name: Not on file  . Number of children: Not on file  . Years of education: Not on file  . Highest education level: Not on file  Occupational History  . Not on file  Social Needs  . Financial resource strain: Not on file  . Food insecurity:    Worry: Not on file    Inability: Not on file  . Transportation needs:    Medical: Not on file    Non-medical: Not on file  Tobacco Use  .  Smoking status: Never Smoker  . Smokeless tobacco: Never Used  Substance and Sexual Activity  . Alcohol use: No  . Drug use: No  . Sexual activity: Not Currently    Birth control/protection: Surgical  Lifestyle  . Physical activity:    Days per week: Not on file    Minutes per session: Not on file  . Stress: Not on file  Relationships  . Social connections:    Talks on phone: Not on file    Gets together: Not on file    Attends religious service: Not on file    Active member of club or organization: Not on file    Attends meetings of clubs or organizations: Not on file    Relationship status: Not on file  . Intimate partner  violence:    Fear of current or ex partner: Not on file    Emotionally abused: Not on file    Physically abused: Not on file    Forced sexual activity: Not on file  Other Topics Concern  . Not on file  Social History Narrative  . Not on file   Family History  Problem Relation Age of Onset  . Cancer Other   . Hypertension Other   . Diabetes Other   . Diabetes Mother   . Heart disease Mother   . Hypertension Mother   . Hypertension Father   . High Cholesterol Father   . Hypertension Sister   . Kidney disease Sister   . Stroke Sister   . Hypertension Sister   . Hearing loss Son     OBJECTIVE:  Vitals:   04/01/18 1107  BP: (!) 146/99  Pulse: 80  Resp: 18  Temp: 98.3 F (36.8 C)  TempSrc: Oral  SpO2: 100%     General appearance: alert; appears fatigued, but nontoxic; speaking in full sentences and tolerating own secretions HEENT: NCAT; Ears: EACs clear, TMs pearly gray; Eyes: PERRL.  EOM grossly intact. Sinuses: nontender; Nose: nares patent without rhinorrhea, Throat: oropharynx clear, tonsils non erythematous or enlarged, uvula midline  Neck: supple without LAD Lungs: unlabored respirations, symmetrical air entry; cough: mild; no respiratory distress; CTAB Chest wall: mild tenderness with lateral compression Heart: regular rate and rhythm.  Radial pulses 2+ symmetrical bilaterally Skin: warm and dry Psychological: alert and cooperative; normal mood and affect  ASSESSMENT & PLAN:  1. Flu-like symptoms   2. Costochondritis     Meds ordered this encounter  Medications  . albuterol (PROVENTIL HFA;VENTOLIN HFA) 108 (90 Base) MCG/ACT inhaler 2 puff  . benzonatate (TESSALON) 100 MG capsule    Sig: Take 1 capsule (100 mg total) by mouth every 8 (eight) hours.    Dispense:  21 capsule    Refill:  0    Order Specific Question:   Supervising Provider    Answer:   Isa Rankin [[id number]]   Inhaler given in office.  Use as needed for shortness of breath or  wheezes Get plenty of rest and push fluids Tessalon Perles prescribed for cough Use OTC medications like ibuprofen or tylenol as needed fever or pain Follow up with PCP if symptoms persist Return or go to ER if you have any new or worsening symptoms fever, chills, nausea, vomiting, chest pain, cough, shortness of breath, wheezing, abdominal pain, changes in bowel or bladder habits, etc...  Reviewed expectations re: course of current medical issues. Questions answered. Outlined signs and symptoms indicating need for more acute intervention. Patient verbalized understanding. After Visit Summary given.  Rennis Harding, PA-C 04/01/18 1126

## 2018-04-01 NOTE — Discharge Instructions (Signed)
Inhaler given in office.  Use as needed for shortness of breath or wheezes Get plenty of rest and push fluids Tessalon Perles prescribed for cough Use OTC medications like ibuprofen or tylenol as needed fever or pain Follow up with PCP if symptoms persist Return or go to ER if you have any new or worsening symptoms fever, chills, nausea, vomiting, chest pain, cough, shortness of breath, wheezing, abdominal pain, changes in bowel or bladder habits, etc..Marland Kitchen

## 2018-04-03 ENCOUNTER — Encounter: Payer: Self-pay | Admitting: Family Medicine

## 2018-04-03 ENCOUNTER — Other Ambulatory Visit: Payer: Self-pay | Admitting: Family Medicine

## 2018-04-03 MED ORDER — METFORMIN HCL ER 500 MG PO TB24: 500.0000 mg | ORAL_TABLET | Freq: Every day | ORAL | 0 refills | Status: DC

## 2018-04-04 ENCOUNTER — Ambulatory Visit (INDEPENDENT_AMBULATORY_CARE_PROVIDER_SITE_OTHER): Payer: Managed Care, Other (non HMO)

## 2018-04-04 ENCOUNTER — Encounter: Payer: Self-pay | Admitting: Family Medicine

## 2018-04-04 ENCOUNTER — Ambulatory Visit (INDEPENDENT_AMBULATORY_CARE_PROVIDER_SITE_OTHER): Payer: Managed Care, Other (non HMO) | Admitting: Family Medicine

## 2018-04-04 VITALS — BP 128/82 | HR 67 | Temp 98.1°F | Ht 64.0 in | Wt 204.8 lb

## 2018-04-04 DIAGNOSIS — R091 Pleurisy: Secondary | ICD-10-CM | POA: Diagnosis not present

## 2018-04-04 DIAGNOSIS — J40 Bronchitis, not specified as acute or chronic: Secondary | ICD-10-CM

## 2018-04-04 MED ORDER — AZITHROMYCIN 250 MG PO TABS: ORAL_TABLET | ORAL | 0 refills | Status: DC

## 2018-04-04 MED ORDER — FLUTICASONE PROPIONATE 50 MCG/ACT NA SUSP: 2.0000 | Freq: Every day | NASAL | 6 refills | Status: DC

## 2018-04-04 MED ORDER — GUAIFENESIN-CODEINE 100-10 MG/5ML PO SOLN: 10.0000 mL | Freq: Four times a day (QID) | ORAL | 0 refills | Status: DC | PRN

## 2018-04-04 NOTE — Patient Instructions (Signed)
Cool mist humidifier at night flonase at night Robitussin dm sugar free for cough during day and can take codeine cough syrup at night zpack  For pleurisy make sure you do deep breathing, heating pad and ibuprofen.   Pleurisy Pleurisy is irritation and swelling (inflammation) of the linings of your lungs (pleura). This can cause pain in your chest, back, or shoulder. It can also cause trouble breathing. Follow these instructions at home: Medicines  Take over-the-counter and prescription medicines only as told by your doctor.  If you were prescribed antibiotic medicine, take it as told by your doctor. Do not stop taking the antibiotic even if you start to feel better. Activity  Rest and return to your normal activities as told by your doctor. Ask your doctor what activities are safe for you.  Do not drive or use heavy machinery while taking prescription pain medicine. General instructions  Watch for any changes in your condition.  Take deep breaths often, even if it is painful. This can help prevent lung problems.  When lying down, lie on your painful side. This may help you feel less pain.  Do not smoke. If you need help quitting, ask your doctor.  Keep all follow-up visits as told by your doctor. This is important. Contact a doctor if:  You have pain that: ? Gets worse. ? Does not get better with medicine. ? Lasts for more than 1 week.  You have a fever or chills.  You have a cough that does not get better at home.  You have trouble breathing that does not get better at home.  You cough up liquid that looks like pus (purulent secretions). Get help right away if:  Your lips, fingernails, or toenails turn dark or turn blue.  You cough up blood.  You have trouble breathing that gets worse.  You are making loud noises when you breathe (wheezing) and this gets worse.  You have pain that spreads to your neck, arms, or jaw.  You get a rash.  You throw up  (vomit).  You pass out (faint). Summary  Pleurisy is irritation and swelling (inflammation) of the linings of your lungs (pleura).  Pleurisy can cause pain and trouble breathing.  If you have a cough that does not get better at home, contact your doctor.  Get help right away if you are having trouble breathing and it is getting worse. This information is not intended to replace advice given to you by your health care provider. Make sure you discuss any questions you have with your health care provider. Document Released: 04/21/2008 Document Revised: 02/01/2016 Document Reviewed: 02/01/2016 Elsevier Interactive Patient Education  2017 [employer] Acute Bronchitis, Adult Acute bronchitis is when air tubes (bronchi) in the lungs suddenly get swollen. The condition can make it hard to breathe. It can also cause these symptoms:  A cough.  Coughing up clear, yellow, or green mucus.  Wheezing.  Chest congestion.  Shortness of breath.  A fever.  Body aches.  Chills.  A sore throat.  Follow these instructions at home: Medicines  Take over-the-counter and prescription medicines only as told by your doctor.  If you were prescribed an antibiotic medicine, take it as told by your doctor. Do not stop taking the antibiotic even if you start to feel better. General instructions  Rest.  Drink enough fluids to keep your pee (urine) clear or pale yellow.  Avoid smoking and secondhand smoke. If you smoke and you need help quitting, ask your  doctor. Quitting will help your lungs heal faster.  Use an inhaler, cool mist vaporizer, or humidifier as told by your doctor.  Keep all follow-up visits as told by your doctor. This is important. How is this prevented? To lower your risk of getting this condition again:  Wash your hands often with soap and water. If you cannot use soap and water, use hand sanitizer.  Avoid contact with people who have cold symptoms.  Try not to touch  your hands to your mouth, nose, or eyes.  Make sure to get the flu shot every year.  Contact a doctor if:  Your symptoms do not get better in 2 weeks. Get help right away if:  You cough up blood.  You have chest pain.  You have very bad shortness of breath.  You become dehydrated.  You faint (pass out) or keep feeling like you are going to pass out.  You keep throwing up (vomiting).  You have a very bad headache.  Your fever or chills gets worse. This information is not intended to replace advice given to you by your health care provider. Make sure you discuss any questions you have with your health care provider. Document Released: 10/26/2007 Document Revised: 12/16/2015 Document Reviewed: 10/28/2015 Elsevier Interactive Patient Education  [employer].

## 2018-04-04 NOTE — Progress Notes (Signed)
Patient: Renee Knapp MRN: [id number] DOB: 1975/10/16 PCP: [name], MD     Subjective:  Chief Complaint  Patient presents with  . Cough  . Generalized Body Aches    HPI: The patient is a 42 y.o. female who presents today for cough x 2 weeks. She had congestion, cough, sore throat. Cough started off dry and has now become productive. She was seen in urgent care on 04/01/18 and was diagnosed with "flu like illness." given tessalon pearls and she can not really tell a difference. Has never had a fever. She is around kids all of the time and someone is always sick. This week cough has become productive and she feels like it's getting worse. Has rib pain with coughing. She denies any wheezing or shortness of breath. She has not taken any other over the counter medication. She does not smoke and does not have asthma. Hx of diabetes.   Review of Systems  Constitutional: Positive for chills and fatigue. Negative for fever.  HENT: Positive for congestion and ear pain. Negative for rhinorrhea, sinus pressure, sinus pain and sore throat.   Respiratory: Positive for cough and shortness of breath.   Cardiovascular: Positive for chest pain.       Only experiencing chest discomfort when coughing  Gastrointestinal: Positive for diarrhea. Negative for abdominal pain, nausea and vomiting.  Musculoskeletal: Positive for arthralgias and back pain. Negative for neck pain.       Feels achey all over    Neurological: Positive for headaches. Negative for dizziness.  Psychiatric/Behavioral: Positive for sleep disturbance.    Allergies Patient is allergic to bee venom.  Past Medical History Patient  has a past medical history of Anemia, Diabetes mellitus without complication (HCC), Obesity, Plantar fasciitis, bilateral, and SVD (spontaneous vaginal delivery).  Surgical History Patient  has a past surgical history that includes Tubal ligation; Dilation and curettage of uterus; Wisdom tooth  extraction; Laparoscopic assisted vaginal hysterectomy (N/A, 02/24/2014); and Cystoscopy (N/A, 02/24/2014).  Family History Pateint's family history includes Cancer in her other; Diabetes in her mother and other; Hearing loss in her son; Heart disease in her mother; High Cholesterol in her father; Hypertension in her father, mother, other, sister, and sister; Kidney disease in her sister; Stroke in her sister.  Social History Patient  reports that she has never smoked. She has never used smokeless tobacco. She reports that she does not drink alcohol or use drugs.    Objective: Vitals:   04/04/18 0926  BP: 128/82  Pulse: 67  Temp: 98.1 F (36.7 C)  TempSrc: Oral  SpO2: 98%  Weight: 204 lb 12.8 oz (92.9 kg)  Height: 5\' 4"  (1.626 m)    Body mass index is 35.15 kg/m.  Physical Exam  Constitutional: She appears well-developed and well-nourished.  HENT:  Right Ear: External ear normal.  Left Ear: External ear normal.  Mouth/Throat: Oropharynx is clear and moist. No oropharyngeal exudate.  Tm pearly with light reflex bilaterally  Mild TTP over maxillary sinuses   Neck: Normal range of motion. Neck supple. No thyromegaly present.  Cardiovascular: Normal rate, regular rhythm and normal heart sounds.  Pulmonary/Chest: Effort normal and breath sounds normal. She has no wheezes. She has no rales.  Abdominal: Soft. Bowel sounds are normal.  Lymphadenopathy:    She has no cervical adenopathy.  Skin: No rash noted.  Vitals reviewed.  CXR: airway disease. No consolidation. Official read pending.      Assessment/plan: 1. Bronchitis Discussed course of bronchitis and  that cough could last for up to 6 weeks. Since >10 days will do zpack. Also discussed tessalon pearls that PA sent in from urgent care and recommended sugar free robitussin DM for cough. Also want her to do cool mist humidifier at night. Sending in flonase as well as codeine cough syrup to have at night as well. Drowsy  precautions given. Fever/worsening symptoms let us know or go to ER.  - DG Chest 2 View  2. Pleurisy Discussed diagnosis in detail. Work on deep breathing, heating and NSAIDs as main course of treatment. Once cough subsides should improve.    Return if symptoms worsen or fail to improve.   Orland MustardAllison Mayra Jolliffe, MD [facility]   04/04/2018

## 2018-06-08 ENCOUNTER — Other Ambulatory Visit: Payer: Self-pay

## 2018-06-08 ENCOUNTER — Encounter: Payer: Self-pay | Admitting: Family Medicine

## 2018-06-08 MED ORDER — METFORMIN HCL ER 500 MG PO TB24: ORAL_TABLET | ORAL | 1 refills | Status: DC

## 2018-06-18 ENCOUNTER — Encounter: Payer: Self-pay | Admitting: Family Medicine

## 2018-06-21 ENCOUNTER — Ambulatory Visit: Payer: Managed Care, Other (non HMO) | Admitting: Family Medicine

## 2018-06-21 ENCOUNTER — Ambulatory Visit (INDEPENDENT_AMBULATORY_CARE_PROVIDER_SITE_OTHER): Payer: Managed Care, Other (non HMO)

## 2018-06-21 ENCOUNTER — Encounter: Payer: Self-pay | Admitting: Family Medicine

## 2018-06-21 ENCOUNTER — Ambulatory Visit: Payer: Self-pay | Admitting: Family Medicine

## 2018-06-21 ENCOUNTER — Other Ambulatory Visit: Payer: Self-pay

## 2018-06-21 VITALS — BP 124/78 | HR 68 | Temp 98.1°F | Ht 64.0 in | Wt 205.0 lb

## 2018-06-21 DIAGNOSIS — R319 Hematuria, unspecified: Secondary | ICD-10-CM | POA: Diagnosis not present

## 2018-06-21 DIAGNOSIS — R109 Unspecified abdominal pain: Secondary | ICD-10-CM

## 2018-06-21 DIAGNOSIS — R350 Frequency of micturition: Secondary | ICD-10-CM

## 2018-06-21 LAB — POCT URINALYSIS DIPSTICK
Bilirubin, UA: NEGATIVE
Blood, UA: POSITIVE
Glucose, UA: NEGATIVE
Ketones, UA: NEGATIVE
LEUKOCYTES UA: NEGATIVE
NITRITE UA: NEGATIVE
PROTEIN UA: NEGATIVE
SPEC GRAV UA: 1.02 (ref 1.010–1.025)
Urobilinogen, UA: 0.2 E.U./dL
pH, UA: 6.5 (ref 5.0–8.0)

## 2018-06-21 MED ORDER — DICLOFENAC SODIUM 75 MG PO TBEC: 75.0000 mg | DELAYED_RELEASE_TABLET | Freq: Two times a day (BID) | ORAL | 0 refills | Status: DC

## 2018-06-21 MED ORDER — CEPHALEXIN 500 MG PO CAPS: 500.0000 mg | ORAL_CAPSULE | Freq: Two times a day (BID) | ORAL | 0 refills | Status: AC

## 2018-06-21 NOTE — Progress Notes (Signed)
   Chief Complaint:  Renee Knapp is a 43 y.o. female who presents for same day appointment with a chief complaint of abdominal pain.   Assessment/Plan:  Abdominal Pain / Hematuria History consistent with UTI given her current urinary frequency and hesitancy, however she has no dysuria and her UA is negative for nitrites and leukocytes.  KUB today was unrevealing-will await radiology read.  Will check urine culture.  In the meantime, start Keflex 500 mg twice daily to cover for any potential UTI.  If urine culture is negative or if symptoms do not improve with treatment, will check abdominal CT to rule out nephrolithiasis or any other possible intra-abdominal pathology.  Her symptoms are not consistent with ovarian cyst and her exam is reassuring. We will send in small supply of diclofenac 75 mg twice daily as needed.  Would avoid narcotics and other pain medications at this point as we do not have a clear etiology.  Discussed reasons return to care and seek emergent care.     Subjective:  HPI:  Abdominal Cramp, acute problem Started 3-4 days ago.  Pain is located in her right lower quadrant.  Pain is been stable over the last few days.  It is moderate in intensity.  She has had ovarian cyst in the area in the past however pain is not quite as severe as that.  She has tried taking Tylenol and ibuprofen with no improvement.  Yesterday, she noticed a bit of blood on the toilet paper after wiping after urinating.  She did not noticed any frank blood in her urine.  No flank pain.  No nausea or vomiting.  No fevers or chills.  No constipation or diarrhea.  She has had some associated increased urgency and frequency. No other obvious alleviating or aggravating factors.   She had a total hysterectomy 5 years ago.  ROS: Per HPI  PMH: She reports that she has never smoked. She has never used smokeless tobacco. She reports that she does not drink alcohol or use drugs.      Objective:  Physical  Exam: BP 124/78 (BP Location: Left Arm, Patient Position: Sitting, Cuff Size: Normal)   Pulse 68   Temp 98.1 F (36.7 C) (Oral)   Ht 5\' 4"  (1.626 m)   Wt 205 lb (93 kg)   LMP 02/06/2014 (LMP Unknown)   SpO2 96%   BMI 35.19 kg/m   Gen: NAD, resting comfortably CV: Regular rate and rhythm with no murmurs appreciated Pulm: Normal work of breathing, clear to auscultation bilaterally with no crackles, wheezes, or rhonchi GI: Normal bowel sounds present. Soft, Nontender, Nondistended. MSK: No CVA tenderness.   Results for orders placed or performed in visit on 06/21/18 (from the past 24 hour(s))  POCT urinalysis dipstick     Status: None   Collection Time: 06/21/18  1:38 PM  Result Value Ref Range   Color, UA Yellow    Clarity, UA Clear    Glucose, UA Negative Negative   Bilirubin, UA Negative    Ketones, UA Negative    Spec Grav, UA 1.020 1.010 - 1.025   Blood, UA Positive    pH, UA 6.5 5.0 - 8.0   Protein, UA Negative Negative   Urobilinogen, UA 0.2 0.2 or 1.0 E.U./dL   Nitrite, UA Negative    Leukocytes, UA Negative Negative   Appearance     Odor          Pier Laux M. [name], MD 06/21/2018 1:51 PM

## 2018-06-21 NOTE — Patient Instructions (Signed)
It was very nice to see you today!  It is possible that you could have a urinary tract infection.  Please start the antibiotic.  We will check a urine culture that will tell us definitively if this is a urinary tract infection or not.  It is less likely but also possible that she could have a kidney stone.  If your symptoms do not improve with the antibiotic, we will need to get a CT scan.  Please let us know if your symptoms worsen or do not over the next couple of days.  Take care, Dr Jimmey RalphParker

## 2018-06-22 LAB — URINE CULTURE
MICRO NUMBER:: 128869
SPECIMEN QUALITY:: ADEQUATE

## 2018-06-22 NOTE — Progress Notes (Signed)
Please inform patient of the following:  Radiology reviewed her xray and did not find any other abnormalities. Would like for her to continue her current treatment plan. We will contact her once her urine culture comes back in the next couple of days.  Renee Knapp. [name], MD 06/22/2018 1:08 PM

## 2018-06-23 NOTE — Progress Notes (Signed)
Please inform patient of the following:  Her urine culture was negative for UTI. Would like to get a noncontrast CT scan to rule out kidney stones. Please place order.  Katina Degree. [name], MD 06/23/2018 11:23 AM

## 2018-06-26 ENCOUNTER — Other Ambulatory Visit: Payer: Self-pay | Admitting: Family Medicine

## 2018-06-26 DIAGNOSIS — R319 Hematuria, unspecified: Secondary | ICD-10-CM

## 2018-06-27 ENCOUNTER — Encounter: Payer: Self-pay | Admitting: Family Medicine

## 2018-06-28 ENCOUNTER — Encounter (HOSPITAL_COMMUNITY): Payer: Self-pay | Admitting: *Deleted

## 2018-06-28 ENCOUNTER — Emergency Department (HOSPITAL_COMMUNITY)
Admission: EM | Admit: 2018-06-28 | Discharge: 2018-06-28 | Disposition: A | Discharge status: Home / Self Care | Payer: Managed Care, Other (non HMO) | Attending: Emergency Medicine | Admitting: Emergency Medicine

## 2018-06-28 ENCOUNTER — Encounter: Payer: Self-pay | Admitting: Family Medicine

## 2018-06-28 ENCOUNTER — Telehealth: Payer: Self-pay | Admitting: Family Medicine

## 2018-06-28 ENCOUNTER — Other Ambulatory Visit: Payer: Self-pay

## 2018-06-28 ENCOUNTER — Emergency Department (HOSPITAL_COMMUNITY): Payer: Managed Care, Other (non HMO)

## 2018-06-28 DIAGNOSIS — R3911 Hesitancy of micturition: Secondary | ICD-10-CM | POA: Diagnosis not present

## 2018-06-28 DIAGNOSIS — Z79899 Other long term (current) drug therapy: Secondary | ICD-10-CM | POA: Insufficient documentation

## 2018-06-28 DIAGNOSIS — E119 Type 2 diabetes mellitus without complications: Secondary | ICD-10-CM | POA: Insufficient documentation

## 2018-06-28 DIAGNOSIS — N898 Other specified noninflammatory disorders of vagina: Secondary | ICD-10-CM | POA: Insufficient documentation

## 2018-06-28 DIAGNOSIS — R319 Hematuria, unspecified: Secondary | ICD-10-CM | POA: Insufficient documentation

## 2018-06-28 DIAGNOSIS — R109 Unspecified abdominal pain: Secondary | ICD-10-CM

## 2018-06-28 DIAGNOSIS — R350 Frequency of micturition: Secondary | ICD-10-CM | POA: Insufficient documentation

## 2018-06-28 DIAGNOSIS — Z7984 Long term (current) use of oral hypoglycemic drugs: Secondary | ICD-10-CM | POA: Diagnosis not present

## 2018-06-28 DIAGNOSIS — R1031 Right lower quadrant pain: Secondary | ICD-10-CM

## 2018-06-28 LAB — WET PREP, GENITAL
Clue Cells Wet Prep HPF POC: NONE SEEN
Sperm: NONE SEEN
Trich, Wet Prep: NONE SEEN
Yeast Wet Prep HPF POC: NONE SEEN

## 2018-06-28 LAB — URINALYSIS, ROUTINE W REFLEX MICROSCOPIC
Bilirubin Urine: NEGATIVE
Glucose, UA: NEGATIVE mg/dL
Ketones, ur: NEGATIVE mg/dL
Nitrite: NEGATIVE
Protein, ur: NEGATIVE mg/dL
Specific Gravity, Urine: 1.017 (ref 1.005–1.030)
pH: 6 (ref 5.0–8.0)

## 2018-06-28 LAB — COMPREHENSIVE METABOLIC PANEL
ALT: 21 U/L (ref 0–44)
AST: 23 U/L (ref 15–41)
Albumin: 3.7 g/dL (ref 3.5–5.0)
Alkaline Phosphatase: 73 U/L (ref 38–126)
Anion gap: 9 (ref 5–15)
BUN: 9 mg/dL (ref 6–20)
CO2: 27 mmol/L (ref 22–32)
Calcium: 9 mg/dL (ref 8.9–10.3)
Chloride: 104 mmol/L (ref 98–111)
Creatinine, Ser: 0.8 mg/dL (ref 0.44–1.00)
GFR calc Af Amer: >60 mL/min (ref >60)
GFR calc non Af Amer: >60 mL/min (ref >60)
Glucose, Bld: 194 mg/dL — ABNORMAL HIGH (ref 70–99)
Potassium: 3.9 mmol/L (ref 3.5–5.1)
Sodium: 140 mmol/L (ref 135–145)
Total Bilirubin: 0.7 mg/dL (ref 0.3–1.2)
Total Protein: 7.3 g/dL (ref 6.5–8.1)

## 2018-06-28 LAB — CBC WITH DIFFERENTIAL/PLATELET
Abs Immature Granulocytes: 0.02 10*3/uL (ref 0.00–0.07)
Basophils Absolute: 0 10*3/uL (ref 0.0–0.1)
Basophils Relative: 0 %
Eosinophils Absolute: 0.3 10*3/uL (ref 0.0–0.5)
Eosinophils Relative: 4 %
HCT: 42.8 % (ref 36.0–46.0)
Hemoglobin: 13.2 g/dL (ref 12.0–15.0)
Immature Granulocytes: 0 %
Lymphocytes Relative: 52 %
Lymphs Abs: 3.5 10*3/uL (ref 0.7–4.0)
MCH: 26.3 pg (ref 26.0–34.0)
MCHC: 30.8 g/dL (ref 30.0–36.0)
MCV: 85.3 fL (ref 80.0–100.0)
Monocytes Absolute: 0.5 10*3/uL (ref 0.1–1.0)
Monocytes Relative: 7 %
Neutro Abs: 2.6 10*3/uL (ref 1.7–7.7)
Neutrophils Relative %: 37 %
Platelets: 319 10*3/uL (ref 150–400)
RBC: 5.02 MIL/uL (ref 3.87–5.11)
RDW: 12.9 % (ref 11.5–15.5)
WBC: 6.9 10*3/uL (ref 4.0–10.5)
nRBC: 0 % (ref 0.0–0.2)

## 2018-06-28 LAB — LIPASE, BLOOD: Lipase: 40 U/L (ref 11–51)

## 2018-06-28 MED ORDER — IOHEXOL 300 MG/ML  SOLN
100.0000 mL | Freq: Once | INTRAMUSCULAR | Status: AC | PRN
  Administered 2018-06-28: 100 mL via INTRAVENOUS

## 2018-06-28 MED ORDER — HYDROCODONE-ACETAMINOPHEN 5-325 MG PO TABS: 1.0000 | ORAL_TABLET | Freq: Four times a day (QID) | ORAL | 0 refills | Status: DC | PRN

## 2018-06-28 MED ORDER — MORPHINE SULFATE (PF) 4 MG/ML IV SOLN
4.0000 mg | Freq: Once | INTRAVENOUS | Status: AC
  Administered 2018-06-28: 4 mg via INTRAVENOUS
  Filled 2018-06-28: qty 1

## 2018-06-28 NOTE — Discharge Instructions (Addendum)
We were unable to find a cause for your pain today.  You did have blood in your urine.  If this is not clearing, you may need to see a urologist.  Please follow-up with your doctor for recheck of this.  If you have not had a repeat colonoscopy, this may be something your doctor can arrange for you as well.  You will be called in 3 days if any of the swabs we collected during your pelvic exam come back positive.  Please return to the emergency department if you develop any new or worsening symptoms including severe, worsening abdominal pain, fever of 100.4, intractable vomiting, or any other concerning symptoms.  Do not drink alcohol, drive, operate machinery or participate in any other potentially dangerous activities while taking opiate pain medication as it may make you sleepy. Do not take this medication with any other sedating medications, either prescription or over-the-counter. If you were prescribed Percocet or Vicodin, do not take these with acetaminophen (Tylenol) as it is already contained within these medications and overdose of Tylenol is dangerous.   This medication is an opiate (or narcotic) pain medication and can be habit forming.  Use it as little as possible to achieve adequate pain control.  Do not use or use it with extreme caution if you have a history of opiate abuse or dependence. This medication is intended for your use only - do not give any to anyone else and keep it in a secure place where nobody else, especially children, have access to it. It will also cause or worsen constipation, so you may want to consider taking an over-the-counter stool softener while you are taking this medication.

## 2018-06-28 NOTE — Telephone Encounter (Signed)
Patient will have CT app made today. She did not go to work today would like work note. Can you get one or does she need to be seen.

## 2018-06-28 NOTE — Telephone Encounter (Signed)
See note

## 2018-06-28 NOTE — Telephone Encounter (Signed)
Copied from CRM [id number]. Topic: General - Other >> Jun 28, 2018  4:29 PM Tamela OddiHarris, Ismerai Bin J wrote: Reason for CRM: Elease HashimotoPatricia with [facility] called to get clarification on an order for patient to have CT scan.  Please call Elease Hashimotoatricia verify if stone disease is suspected.  CB# [phone]

## 2018-06-28 NOTE — ED Notes (Signed)
Patient transported to CT 

## 2018-06-28 NOTE — ED Triage Notes (Signed)
RT flank pain since Tuesday last weedk . Seen by PCP and started on Ant-Bx . Pt reports pain is still present and Pt reports seeing something in urine today the size of a dime.

## 2018-06-28 NOTE — ED Provider Notes (Signed)
[facility] EMERGENCY DEPARTMENT Provider Note   CSN: [id number] Arrival date & time: 06/28/18  1644     History   Chief Complaint Chief Complaint  Patient presents with  . Flank Pain    HPI TERESITA WOJICK is a 43 y.o. female with history of obesity, diabetes who presents with a one-week history of right lower quadrant pain.  She reports initially had some urinary frequency and hesitancy, however this has improved.  Patient reports 2 days ago she passed a dime sized thing through her urine that was like a rock.  However, she continues to have pain.  Her doctor ordered a CT scan however she could not wait for that to be scheduled and came here instead.  She has been taking Keflex, as they thought her symptoms may have been UTI, however urine culture was negative.  She was told to stop the Keflex.  She was also given diclofenac for pain.  Patient reports pain radiating towards her right side.  Patient has history of ovarian cyst, however this feels different.  She reports some pink vaginal discharge that she feels is vaginal versus urinary.  Patient status post hysterectomy.  She denies any nausea, vomiting, chest pain, shortness of breath, fevers.  HPI  Past Medical History:  Diagnosis Date  . Anemia    History  . Diabetes mellitus without complication (HCC)    diet and exercise controlled - no meds  . Obesity   . Plantar fasciitis, bilateral    History - recvd cortisone injections bilateral- no current prob as 01/2014  . SVD (spontaneous vaginal delivery)    x 2    Patient Active Problem List   Diagnosis Date Noted  . Vitamin D deficiency 01/18/2018  . Hyperlipidemia 11/25/2017  . Controlled type 2 diabetes mellitus without complication, without long-term current use of insulin (HCC) 11/10/2017  . Acute pericarditis 09/01/2015  . S/P laparoscopic assisted vaginal hysterectomy (LAVH) 02/24/2014    Past Surgical History:  Procedure Laterality Date  .  CYSTOSCOPY N/A 02/24/2014   Procedure: CYSTOSCOPY;  Surgeon: Essie Hart, MD;  Location: [facility];  Service: Gynecology;  Laterality: N/A;  . DILATION AND CURETTAGE OF UTERUS     MAB  . LAPAROSCOPIC ASSISTED VAGINAL HYSTERECTOMY N/A 02/24/2014   Procedure: LAPAROSCOPIC ASSISTED VAGINAL HYSTERECTOMY WITH BILATERAL SALPINGECTOMY ;  Surgeon: Essie Hart, MD;  Location: [facility];  Service: Gynecology;  Laterality: N/A;  . TUBAL LIGATION    . WISDOM TOOTH EXTRACTION       OB History    Gravida  3   Para      Term      Preterm      AB  1   Living  2     SAB  1   TAB      Ectopic      Multiple      Live Births               Home Medications    Prior to Admission medications   Medication Sig Start Date End Date Taking? Authorizing Provider  cephALEXin (KEFLEX) 500 MG capsule Take 1 capsule (500 mg total) by mouth 2 (two) times daily for 7 days. Take for 7 days 06/21/18 06/28/18 Yes Ardith Dark, MD  diclofenac (VOLTAREN) 75 MG EC tablet Take 1 tablet (75 mg total) by mouth 2 (two) times daily. 06/21/18  Yes Ardith Dark, MD  EPINEPHrine (EPIPEN) 0.3 mg/0.3 mL IJ SOAJ injection Inject  0.3 mLs (0.3 mg total) into the muscle once as needed (for allergic reaction). 11/22/17  Yes Orland MustardWolfe, Allison, MD  fluticasone [facility](FLONASE) 50 MCG/ACT nasal spray Place 2 sprays into both nostrils daily. Patient taking differently: Place 2 sprays into both nostrils daily as needed for allergies or rhinitis.  04/04/18  Yes Orland MustardWolfe, Allison, MD  metFORMIN (GLUCOPHAGE-XR) 500 MG 24 hr tablet Take 2 pills daily w/breakfast and 1 pill every evening. Patient taking differently: Take 500-1,000 mg by mouth See admin instructions. Take 1000mg  daily with breakfast and 500mg  every evening. 06/08/18  Yes Orland MustardWolfe, Allison, MD  ondansetron (ZOFRAN) 4 MG tablet Take 1 tablet (4 mg total) by mouth every 8 (eight) hours as needed for nausea or vomiting. 01/12/18  Yes Orland MustardWolfe, Allison, MD  rosuvastatin (CRESTOR) 10 MG tablet Take 1  tablet (10 mg total) by mouth daily. For cholesterol 11/13/17  Yes Orland MustardWolfe, Allison, MD  HYDROcodone-acetaminophen (NORCO/VICODIN) 5-325 MG tablet Take 1 tablet by mouth every 6 (six) hours as needed. 06/28/18   Emi HolesLaw, Lindsey Demonte M, PA-C    Family History Family History  Problem Relation Age of Onset  . Cancer Other   . Hypertension Other   . Diabetes Other   . Diabetes Mother   . Heart disease Mother   . Hypertension Mother   . Hypertension Father   . High Cholesterol Father   . Hypertension Sister   . Kidney disease Sister   . Stroke Sister   . Hypertension Sister   . Hearing loss Son     Social History Social History   Tobacco Use  . Smoking status: Never Smoker  . Smokeless tobacco: Never Used  Substance Use Topics  . Alcohol use: No  . Drug use: No     Allergies   Bee venom   Review of Systems Review of Systems  Constitutional: Negative for chills and fever.  HENT: Negative for facial swelling and sore throat.   Respiratory: Negative for shortness of breath.   Cardiovascular: Negative for chest pain.  Gastrointestinal: Positive for abdominal pain. Negative for diarrhea, nausea and vomiting.  Genitourinary: Positive for frequency and vaginal discharge (pink). Negative for dysuria.  Musculoskeletal: Negative for back pain.  Skin: Negative for rash and wound.  Neurological: Negative for headaches.  Psychiatric/Behavioral: The patient is not nervous/anxious.      Physical Exam Updated Vital Signs BP 137/85 (BP Location: Left Arm)   Pulse 98   Temp 98.4 F (36.9 C) (Oral)   Resp 18   Ht 5\' 4"  (1.626 m)   Wt 92.9 kg   LMP 02/06/2014 (LMP Unknown)   SpO2 98%   BMI 35.16 kg/m   Physical Exam Vitals signs and nursing note reviewed. Exam conducted with a chaperone present.  Constitutional:      General: She is not in acute distress.    Appearance: She is well-developed. She is not diaphoretic.  HENT:     Head: Normocephalic and atraumatic.      Mouth/Throat:     Pharynx: No oropharyngeal exudate.  Eyes:     General: No scleral icterus.       Right eye: No discharge.        Left eye: No discharge.     Conjunctiva/sclera: Conjunctivae normal.     Pupils: Pupils are equal, round, and reactive to light.  Neck:     Musculoskeletal: Normal range of motion and neck supple.     Thyroid: No thyromegaly.  Cardiovascular:     Rate and Rhythm:  Normal rate and regular rhythm.     Heart sounds: Normal heart sounds. No murmur. No friction rub. No gallop.   Pulmonary:     Effort: Pulmonary effort is normal. No respiratory distress.     Breath sounds: Normal breath sounds. No stridor. No wheezing or rales.  Abdominal:     General: Bowel sounds are normal. There is no distension.     Palpations: Abdomen is soft.     Tenderness: There is abdominal tenderness in the right upper quadrant and right lower quadrant. There is no right CVA tenderness, left CVA tenderness, guarding or rebound. Positive signs include Murphy's sign and McBurney's sign.  Genitourinary:    Vagina: No vaginal discharge or bleeding.     Uterus: Absent.      Adnexa:        Right: No tenderness.         Left: No tenderness.    Lymphadenopathy:     Cervical: No cervical adenopathy.  Skin:    General: Skin is warm and dry.     Coloration: Skin is not pale.     Findings: No rash.  Neurological:     Mental Status: She is alert.     Coordination: Coordination normal.      ED Treatments / Results  Labs (all labs ordered are listed, but only abnormal results are displayed) Labs Reviewed  WET PREP, GENITAL - Abnormal; Notable for the following components:      Result Value   WBC, Wet Prep HPF POC MANY (*)    All other components within normal limits  COMPREHENSIVE METABOLIC PANEL - Abnormal; Notable for the following components:   Glucose, Bld 194 (*)    All other components within normal limits  URINALYSIS, ROUTINE W REFLEX MICROSCOPIC - Abnormal; Notable for the  following components:   Hgb urine dipstick SMALL (*)    Leukocytes, UA TRACE (*)    Bacteria, UA RARE (*)    All other components within normal limits  CBC WITH DIFFERENTIAL/PLATELET  LIPASE, BLOOD  GC/CHLAMYDIA PROBE AMP ([place]) NOT AT [facility]    EKG None  Radiology Ct Abdomen Pelvis W Contrast  Result Date: 06/28/2018 CLINICAL DATA:  Right lower quadrant pain for 1 week EXAM: CT ABDOMEN AND PELVIS WITH CONTRAST TECHNIQUE: Multidetector CT imaging of the abdomen and pelvis was performed using the standard protocol following bolus administration of intravenous contrast. CONTRAST:  OMNIPAQUE IOHEXOL 300 MG/ML  SOLN COMPARISON:  None. FINDINGS: Lower chest: No acute abnormality. Hepatobiliary: Low attenuation of the liver as can be seen with hepatic steatosis. No hepatic mass. Normal gallbladder. No intrahepatic or extrahepatic biliary ductal dilatation. Pancreas: Unremarkable. No pancreatic ductal dilatation or surrounding inflammatory changes. Spleen: Normal in size without focal abnormality. Adrenals/Urinary Tract: Adrenal glands are unremarkable. Kidneys are normal, without renal calculi, focal lesion, or hydronephrosis. Bladder is unremarkable. Stomach/Bowel: Stomach is within normal limits. Appendix appears normal. No evidence of bowel wall thickening, distention, or inflammatory changes. Vascular/Lymphatic: No significant vascular findings are present. No enlarged abdominal or pelvic lymph nodes. Reproductive: Status post hysterectomy. No adnexal masses. Other: No abdominal wall hernia or abnormality. No abdominopelvic ascites. Musculoskeletal: No acute or significant osseous findings. IMPRESSION: 1. No acute abdominal or pelvic pathology.  Normal appendix. 2. Hepatic steatosis. Electronically Signed   By: Elige Ko   On: 06/28/2018 20:24    Procedures Procedures (including critical care time)  Medications Ordered in ED Medications  morphine 4 MG/ML injection 4 mg (4 mg  Intravenous Given 06/28/18 1947)  iohexol (OMNIPAQUE) 300 MG/ML solution 100 mL (100 mLs Intravenous Contrast Given 06/28/18 1955)     Initial Impression / Assessment and Plan / ED Course  I have reviewed the triage vital signs and the nursing notes.  Pertinent labs & imaging results that were available during my care of the patient were reviewed by me and considered in my medical decision making (see chart for details).     Patient presenting with right lower quadrant pain.  On my exam, patient had right lower quadrant and right upper quadrant tenderness.  Labs are unremarkable.  UA shows small hematuria and trace leukocytes.  Pelvic exam unremarkable.  GC/chlamydia sent and pending.  Wet prep shows many WBCs.  No adnexal tenderness.  CT abdomen pelvis is negative.  There are no signs of the patient's pain.  Patient appears comfortable and is in no acute distress.  Doubt ovarian torsion.  There are no adnexal masses seen on exam.  Considering patient reported a rock like mass passing with her urine, patient may have passed a kidney stone, however she passed this 2 days ago.  Patient also reports that she has family history of colon cancer and had an inconclusive colonoscopy at age 27 and has not had this repeated.  Patient vies to talk to her doctor about completing a colonoscopy.  Patient be discharged home with short course of pain control.  Patient to follow-up with her PCP for further management.  I also advised to have her urine rechecked to make sure blood has cleared.  Return precautions discussed.  Patient understands and agrees with plan.  Patient vitals stable throughout ED course and discharged in satisfactory condition. I discussed patient case with Dr. Effie Shy who guided the patient's management and agrees with plan.   Final Clinical Impressions(s) / ED Diagnoses   Final diagnoses:  RLQ abdominal pain    ED Discharge Orders         Ordered    HYDROcodone-acetaminophen (NORCO/VICODIN)  5-325 MG tablet  Every 6 hours PRN     06/28/18 2038           Emi Holes, PA-C 06/28/18 2042    Mancel Bale, MD 06/28/18 2243

## 2018-07-01 LAB — GC/CHLAMYDIA PROBE AMP (~~LOC~~) NOT AT ARMC
Chlamydia: NEGATIVE
Neisseria Gonorrhea: NEGATIVE

## 2018-07-25 ENCOUNTER — Ambulatory Visit: Payer: Managed Care, Other (non HMO) | Admitting: Family Medicine

## 2018-08-02 ENCOUNTER — Ambulatory Visit: Payer: Managed Care, Other (non HMO) | Admitting: Family Medicine

## 2018-08-02 ENCOUNTER — Other Ambulatory Visit: Payer: Self-pay

## 2018-08-02 ENCOUNTER — Ambulatory Visit (HOSPITAL_COMMUNITY)
Admission: EM | Admit: 2018-08-02 | Discharge: 2018-08-02 | Disposition: A | Discharge status: Home / Self Care | Payer: Managed Care, Other (non HMO) | Attending: Family Medicine | Admitting: Family Medicine

## 2018-08-02 ENCOUNTER — Encounter (HOSPITAL_COMMUNITY): Payer: Self-pay | Admitting: Family Medicine

## 2018-08-02 DIAGNOSIS — J069 Acute upper respiratory infection, unspecified: Secondary | ICD-10-CM | POA: Insufficient documentation

## 2018-08-02 DIAGNOSIS — J029 Acute pharyngitis, unspecified: Secondary | ICD-10-CM | POA: Diagnosis not present

## 2018-08-02 LAB — POCT RAPID STREP A: Streptococcus, Group A Screen (Direct): NEGATIVE

## 2018-08-02 NOTE — ED Triage Notes (Signed)
Pt complains of bilateral ear pain, sore throat and headache that started on Monday.  Pt denies any nasal congestion or fever.

## 2018-08-02 NOTE — ED Provider Notes (Signed)
[facility]    CSN: [id number] Arrival date & time: 08/02/18  1618     History   Chief Complaint Chief Complaint  Patient presents with  . Otalgia    Bilateral  . Sore Throat  . Headache    HPI Renee Knapp is a 43 y.o. female.   Is a 43 year old established patient who presents with Headache; Ear Pain; Sore Throat.  She works in a daycare setting.  She had the flu back in November after getting the flu shot.  Patient's had mild sore throat along with the headache since Monday (4 days).  She has had no fever, nausea, vomiting.     Past Medical History:  Diagnosis Date  . Anemia    History  . Diabetes mellitus without complication (HCC)    diet and exercise controlled - no meds  . Obesity   . Plantar fasciitis, bilateral    History - recvd cortisone injections bilateral- no current prob as 01/2014  . SVD (spontaneous vaginal delivery)    x 2    Patient Active Problem List   Diagnosis Date Noted  . Vitamin D deficiency 01/18/2018  . Hyperlipidemia 11/25/2017  . Controlled type 2 diabetes mellitus without complication, without long-term current use of insulin (HCC) 11/10/2017  . Acute pericarditis 09/01/2015  . S/P laparoscopic assisted vaginal hysterectomy (LAVH) 02/24/2014    Past Surgical History:  Procedure Laterality Date  . CYSTOSCOPY N/A 02/24/2014   Procedure: CYSTOSCOPY;  Surgeon: Essie Hart, MD;  Location: [facility];  Service: Gynecology;  Laterality: N/A;  . DILATION AND CURETTAGE OF UTERUS     MAB  . LAPAROSCOPIC ASSISTED VAGINAL HYSTERECTOMY N/A 02/24/2014   Procedure: LAPAROSCOPIC ASSISTED VAGINAL HYSTERECTOMY WITH BILATERAL SALPINGECTOMY ;  Surgeon: Essie Hart, MD;  Location: [facility];  Service: Gynecology;  Laterality: N/A;  . TUBAL LIGATION    . WISDOM TOOTH EXTRACTION      OB History    Gravida  3   Para      Term      Preterm      AB  1   Living  2     SAB  1   TAB      Ectopic      Multiple      Live Births                Home Medications    Prior to Admission medications   Medication Sig Start Date End Date Taking? Authorizing Provider  metFORMIN (GLUCOPHAGE-XR) 500 MG 24 hr tablet Take 2 pills daily w/breakfast and 1 pill every evening. Patient taking differently: Take 500-1,000 mg by mouth See admin instructions. Take 1000mg  daily with breakfast and 500mg  every evening. 06/08/18  Yes [name], MD  rosuvastatin (CRESTOR) 10 MG tablet Take 1 tablet (10 mg total) by mouth daily. For cholesterol 11/13/17  Yes [name], MD  diclofenac (VOLTAREN) 75 MG EC tablet Take 1 tablet (75 mg total) by mouth 2 (two) times daily. 06/21/18   Ardith Dark, MD  EPINEPHrine (EPIPEN) 0.3 mg/0.3 mL IJ SOAJ injection Inject 0.3 mLs (0.3 mg total) into the muscle once as needed (for allergic reaction). 11/22/17   [name], MD  fluticasone (FLONASE) 50 MCG/ACT nasal spray Place 2 sprays into both nostrils daily. Patient taking differently: Place 2 sprays into both nostrils daily as needed for allergies or rhinitis.  04/04/18   [name], MD  HYDROcodone-acetaminophen (NORCO/VICODIN) 5-325 MG tablet Take 1 tablet by  mouth every 6 (six) hours as needed. 06/28/18   Emi Holes, PA-C  ondansetron (ZOFRAN) 4 MG tablet Take 1 tablet (4 mg total) by mouth every 8 (eight) hours as needed for nausea or vomiting. 01/12/18   [name], MD    Family History Family History  Problem Relation Age of Onset  . Cancer Other   . Hypertension Other   . Diabetes Other   . Diabetes Mother   . Heart disease Mother   . Hypertension Mother   . Hypertension Father   . High Cholesterol Father   . Hypertension Sister   . Kidney disease Sister   . Stroke Sister   . Hypertension Sister   . Hearing loss Son     Social History Social History   Tobacco Use  . Smoking status: Never Smoker  . Smokeless tobacco: Never Used  Substance Use Topics  . Alcohol use: No  . Drug use: No     Allergies   Bee  venom   Review of Systems Review of Systems   Physical Exam Triage Vital Signs ED Triage Vitals  Enc Vitals Group     BP      Pulse      Resp      Temp      Temp src      SpO2      Weight      Height      Head Circumference      Peak Flow      Pain Score      Pain Loc      Pain Edu?      Excl. in GC?    No data found.  Updated Vital Signs BP 121/80 (BP Location: Left Arm)   Pulse 80   Temp 98.5 F (36.9 C) (Oral)   Resp 16   LMP 02/06/2014 (LMP Unknown)   SpO2 98%    Physical Exam Vitals signs and nursing note reviewed.  Constitutional:      Appearance: She is well-developed. She is obese.  HENT:     Head: Normocephalic.     Right Ear: Tympanic membrane and ear canal normal.     Left Ear: Tympanic membrane and ear canal normal.     Mouth/Throat:     Mouth: Mucous membranes are moist.     Pharynx: Posterior oropharyngeal erythema present. No pharyngeal swelling, oropharyngeal exudate or uvula swelling.     Tonsils: No tonsillar exudate or tonsillar abscesses.  Eyes:     Conjunctiva/sclera: Conjunctivae normal.  Neck:     Musculoskeletal: Normal range of motion and neck supple.  Cardiovascular:     Heart sounds: Normal heart sounds.  Pulmonary:     Effort: Pulmonary effort is normal.     Breath sounds: Normal breath sounds.  Skin:    General: Skin is warm and dry.  Neurological:     General: No focal deficit present.     Mental Status: She is alert.  Psychiatric:        Mood and Affect: Mood normal.      UC Treatments / Results  Labs (all labs ordered are listed, but only abnormal results are displayed) Labs Reviewed  CULTURE, GROUP A STREP [facility])  POCT RAPID STREP A    EKG None  Radiology No results found.  Procedures Procedures (including critical care time)  Medications Ordered in UC Medications - No data to display  Initial Impression / Assessment and Plan / UC Course  I have reviewed the triage vital signs and the nursing  notes.  Pertinent labs & imaging results that were available during my care of the patient were reviewed by me and considered in my medical decision making (see chart for details).    Final Clinical Impressions(s) / UC Diagnoses   Final diagnoses:  Viral upper respiratory tract infection     Discharge Instructions     The strep test is negative.  This appears to be an upper respiratory infection that should gradually subside over the next couple days.  You can use ibuprofen for comfort.  I think it safe to go back to work tomorrow.    ED Prescriptions    None     Controlled Substance Prescriptions [employer] Controlled Substance Registry consulted? Not Applicable   Elvina SidleLauenstein, Xaiden Fleig, MD 08/02/18 1733

## 2018-08-02 NOTE — Discharge Instructions (Signed)
The strep test is negative.  This appears to be an upper respiratory infection that should gradually subside over the next couple days.  You can use ibuprofen for comfort.  I think it safe to go back to work tomorrow.

## 2018-08-04 LAB — CULTURE, GROUP A STREP (THRC)

## 2018-08-06 ENCOUNTER — Other Ambulatory Visit: Payer: Self-pay

## 2018-08-06 ENCOUNTER — Ambulatory Visit: Payer: Managed Care, Other (non HMO) | Admitting: Physician Assistant

## 2018-08-06 ENCOUNTER — Encounter: Payer: Self-pay | Admitting: Physician Assistant

## 2018-08-06 ENCOUNTER — Ambulatory Visit: Payer: Managed Care, Other (non HMO) | Admitting: Family Medicine

## 2018-08-06 VITALS — BP 126/90 | HR 87 | Temp 98.6°F | Ht 64.0 in | Wt 213.0 lb

## 2018-08-06 DIAGNOSIS — R6889 Other general symptoms and signs: Secondary | ICD-10-CM | POA: Diagnosis not present

## 2018-08-06 DIAGNOSIS — J029 Acute pharyngitis, unspecified: Secondary | ICD-10-CM

## 2018-08-06 LAB — POCT RAPID STREP A (OFFICE): Rapid Strep A Screen: POSITIVE — AB

## 2018-08-06 LAB — POC INFLUENZA A&B (BINAX/QUICKVUE)
Influenza A, POC: NEGATIVE
Influenza B, POC: NEGATIVE

## 2018-08-06 MED ORDER — AMOXICILLIN 875 MG PO TABS: 875.0000 mg | ORAL_TABLET | Freq: Two times a day (BID) | ORAL | 0 refills | Status: DC

## 2018-08-06 NOTE — Patient Instructions (Signed)
It was great to see you!  Start amoxicillin.  Flu test negative.  Push fluids and get plenty of rest. Please return if you are not improving as expected, or if you have high fevers (>101.5) or difficulty swallowing or worsening productive cough.  Call clinic with questions.  I hope you start feeling better soon!

## 2018-08-06 NOTE — Progress Notes (Signed)
Renee Knapp is a 43 y.o. female here for a follow up of a pre-existing problem.  I acted as a Neurosurgeon for [employer], PA-C [name], LPN  History of Present Illness:   Chief Complaint  Patient presents with  . Sore Throat    Sore Throat   This is a recurrent problem. Episode onset: Started last Wed 11th, pt went to Urgent Care on Thurs and was told URI. The problem has been unchanged. There has been no fever. The pain is at a severity of 8/10. The pain is moderate. Associated symptoms include diarrhea (started yesterday), ear pain and headaches. Pertinent negatives include no abdominal pain, congestion, coughing, ear discharge, hoarse voice, neck pain, shortness of breath, swollen glands, trouble swallowing or vomiting. 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 She has tried NSAIDs and cool liquids for the symptoms. The treatment provided no relief.    Past Medical History:  Diagnosis Date  . Anemia    History  . Diabetes mellitus without complication (HCC)    diet and exercise controlled - no meds  . Obesity   . Plantar fasciitis, bilateral    History - recvd cortisone injections bilateral- no current prob as 01/2014  . SVD (spontaneous vaginal delivery)    x 2     Social History   Socioeconomic History  . Marital status: Divorced    Spouse name: Not on file  . Number of children: Not on file  . Years of education: Not on file  . Highest education level: Not on file  Occupational History  . Not on file  Social Needs  . Financial resource strain: Not on file  . Food insecurity:    Worry: Not on file    Inability: Not on file  . Transportation needs:    Medical: Not on file    Non-medical: Not on file  Tobacco Use  . Smoking status: Never Smoker  . Smokeless tobacco: Never Used  Substance and Sexual Activity  . Alcohol use: No  . Drug use: No  . Sexual activity: Not Currently    Birth control/protection: Surgical  Lifestyle  . Physical activity:    Days per  week: Not on file    Minutes per session: Not on file  . Stress: Not on file  Relationships  . Social connections:    Talks on phone: Not on file    Gets together: Not on file    Attends religious service: Not on file    Active member of club or organization: Not on file    Attends meetings of clubs or organizations: Not on file    Relationship status: Not on file  . Intimate partner violence:    Fear of current or ex partner: Not on file    Emotionally abused: Not on file    Physically abused: Not on file    Forced sexual activity: Not on file  Other Topics Concern  . Not on file  Social History Narrative  . Not on file    Past Surgical History:  Procedure Laterality Date  . CYSTOSCOPY N/A 02/24/2014   Procedure: CYSTOSCOPY;  Surgeon: Essie Hart, MD;  Location: [facility];  Service: Gynecology;  Laterality: N/A;  . DILATION AND CURETTAGE OF UTERUS     MAB  . LAPAROSCOPIC ASSISTED VAGINAL HYSTERECTOMY N/A 02/24/2014   Procedure: LAPAROSCOPIC ASSISTED VAGINAL HYSTERECTOMY WITH BILATERAL SALPINGECTOMY ;  Surgeon: Essie Hart, MD;  Location: [facility];  Service: Gynecology;  Laterality: N/A;  .  TUBAL LIGATION    . WISDOM TOOTH EXTRACTION      Family History  Problem Relation Age of Onset  . Cancer Other   . Hypertension Other   . Diabetes Other   . Diabetes Mother   . Heart disease Mother   . Hypertension Mother   . Hypertension Father   . High Cholesterol Father   . Hypertension Sister   . Kidney disease Sister   . Stroke Sister   . Hypertension Sister   . Hearing loss Son     Allergies  Allergen Reactions  . Bee Venom Anaphylaxis    Current Medications:   Current Outpatient Medications:  .  diclofenac (VOLTAREN) 75 MG EC tablet, Take 1 tablet (75 mg total) by mouth 2 (two) times daily., Disp: 30 tablet, Rfl: 0 .  EPINEPHrine (EPIPEN) 0.3 mg/0.3 mL IJ SOAJ injection, Inject 0.3 mLs (0.3 mg total) into the muscle once as needed (for allergic reaction)., Disp: 1 Device,  Rfl: 1 .  fluticasone (FLONASE) 50 MCG/ACT nasal spray, Place 2 sprays into both nostrils daily. (Patient taking differently: Place 2 sprays into both nostrils daily as needed for allergies or rhinitis. ), Disp: 16 g, Rfl: 6 .  HYDROcodone-acetaminophen (NORCO/VICODIN) 5-325 MG tablet, Take 1 tablet by mouth every 6 (six) hours as needed., Disp: 8 tablet, Rfl: 0 .  metFORMIN (GLUCOPHAGE-XR) 500 MG 24 hr tablet, Take 2 pills daily w/breakfast and 1 pill every evening. (Patient taking differently: Take 500-1,000 mg by mouth See admin instructions. Take 1000mg  daily with breakfast and 500mg  every evening.), Disp: 90 tablet, Rfl: 1 .  ondansetron (ZOFRAN) 4 MG tablet, Take 1 tablet (4 mg total) by mouth every 8 (eight) hours as needed for nausea or vomiting., Disp: 20 tablet, Rfl: 0 .  rosuvastatin (CRESTOR) 10 MG tablet, Take 1 tablet (10 mg total) by mouth daily. For cholesterol, Disp: 90 tablet, Rfl: 3 .  amoxicillin (AMOXIL) 875 MG tablet, Take 1 tablet (875 mg total) by mouth 2 (two) times daily., Disp: 20 tablet, Rfl: 0   Review of Systems:   Review of Systems  HENT: Positive for ear pain. Negative for congestion, ear discharge, hoarse voice and trouble swallowing.   Respiratory: Negative for cough and shortness of breath.   Gastrointestinal: Positive for diarrhea (started yesterday). Negative for abdominal pain and vomiting.  Musculoskeletal: Negative for neck pain.  Neurological: Positive for headaches.    Vitals:   Vitals:   08/06/18 1117  BP: 126/90  Pulse: 87  Temp: 98.6 F (37 C)  TempSrc: Oral  SpO2: 98%  Weight: 213 lb (96.6 kg)  Height: 5\' 4"  (1.626 m)     Body mass index is 36.56 kg/m.  Physical Exam:   Physical Exam Vitals signs and nursing note reviewed.  Constitutional:      General: She is not in acute distress.    Appearance: She is well-developed. She is not ill-appearing or toxic-appearing.  HENT:     Head: Normocephalic and atraumatic.     Right Ear:  Tympanic membrane, ear canal and external ear normal. Tympanic membrane is not erythematous, retracted or bulging.     Left Ear: Tympanic membrane, ear canal and external ear normal. Tympanic membrane is not erythematous, retracted or bulging.     Nose: Mucosal edema, congestion and rhinorrhea present.     Right Sinus: No maxillary sinus tenderness or frontal sinus tenderness.     Left Sinus: No maxillary sinus tenderness or frontal sinus tenderness.  Mouth/Throat:     Lips: Pink.     Mouth: Mucous membranes are moist.     Pharynx: Uvula midline. Posterior oropharyngeal erythema present.     Tonsils: Swelling: 1+ on the right. 1+ on the left.  Eyes:     General: Lids are normal.     Conjunctiva/sclera: Conjunctivae normal.  Neck:     Trachea: Trachea normal.  Cardiovascular:     Rate and Rhythm: Normal rate and regular rhythm.     Heart sounds: Normal heart sounds, S1 normal and S2 normal.  Pulmonary:     Effort: Pulmonary effort is normal.     Breath sounds: Normal breath sounds. No decreased breath sounds, wheezing, rhonchi or rales.  Lymphadenopathy:     Cervical: No cervical adenopathy.  Skin:    General: Skin is warm and dry.  Neurological:     Mental Status: She is alert.  Psychiatric:        Speech: Speech normal.        Behavior: Behavior normal. Behavior is cooperative.    Results for orders placed or performed in visit on 08/06/18  POC Influenza A&B(BINAX/QUICKVUE)  Result Value Ref Range   Influenza A, POC Negative Negative   Influenza B, POC Negative Negative  POCT rapid strep A  Result Value Ref Range   Rapid Strep A Screen Positive (A) Negative     Assessment and Plan:   Mikia was seen today for sore throat.  Diagnoses and all orders for this visit:  Flu-like symptoms; Sore throat Rapid strep test positive. Flu negative. No red flags on exam.  Will initiate amoxicillin per orders. Discussed taking medications as prescribed. Reviewed return  precautions including worsening fever, SOB, worsening cough or other concerns. Push fluids and rest. I recommend that patient follow-up if symptoms worsen or persist despite treatment x 7-10 days, sooner if needed. -     POC Influenza A&B(BINAX/QUICKVUE) -     POCT rapid strep A  Other orders -     amoxicillin (AMOXIL) 875 MG tablet; Take 1 tablet (875 mg total) by mouth 2 (two) times daily.    . Reviewed expectations re: course of current medical issues. . Discussed self-management of symptoms. . Outlined signs and symptoms indicating need for more acute intervention. . Patient verbalized understanding and all questions were answered. . See orders for this visit as documented in the electronic medical record. . Patient received an After-Visit Summary.  CMA or LPN served as scribe during this visit. History, Physical, and Plan performed by medical provider. The above documentation has been reviewed and is accurate and complete.   Jarold Motto, PA-C

## 2018-08-10 ENCOUNTER — Encounter: Payer: Self-pay | Admitting: Family Medicine

## 2018-08-13 ENCOUNTER — Other Ambulatory Visit: Payer: Self-pay

## 2018-08-13 ENCOUNTER — Encounter: Payer: Self-pay | Admitting: Family Medicine

## 2018-08-13 ENCOUNTER — Telehealth: Payer: Self-pay

## 2018-08-13 ENCOUNTER — Ambulatory Visit: Payer: Managed Care, Other (non HMO) | Admitting: Family Medicine

## 2018-08-13 VITALS — BP 134/94 | HR 92 | Temp 98.6°F | Ht 64.0 in | Wt 213.0 lb

## 2018-08-13 DIAGNOSIS — E119 Type 2 diabetes mellitus without complications: Secondary | ICD-10-CM

## 2018-08-13 DIAGNOSIS — J029 Acute pharyngitis, unspecified: Secondary | ICD-10-CM

## 2018-08-13 DIAGNOSIS — R030 Elevated blood-pressure reading, without diagnosis of hypertension: Secondary | ICD-10-CM

## 2018-08-13 LAB — CBC WITH DIFFERENTIAL/PLATELET
Basophils Absolute: 0.1 10*3/uL (ref 0.0–0.1)
Basophils Relative: 0.8 % (ref 0.0–3.0)
Eosinophils Absolute: 0.2 10*3/uL (ref 0.0–0.7)
Eosinophils Relative: 3.5 % (ref 0.0–5.0)
HCT: 40 % (ref 36.0–46.0)
Hemoglobin: 13.4 g/dL (ref 12.0–15.0)
Lymphocytes Relative: 50.1 % — ABNORMAL HIGH (ref 12.0–46.0)
Lymphs Abs: 3.3 10*3/uL (ref 0.7–4.0)
MCHC: 33.5 g/dL (ref 30.0–36.0)
MCV: 81.6 fl (ref 78.0–100.0)
Monocytes Absolute: 0.4 10*3/uL (ref 0.1–1.0)
Monocytes Relative: 5.8 % (ref 3.0–12.0)
NEUTROS ABS: 2.6 10*3/uL (ref 1.4–7.7)
Neutrophils Relative %: 39.8 % — ABNORMAL LOW (ref 43.0–77.0)
Platelets: 330 10*3/uL (ref 150.0–400.0)
RBC: 4.9 Mil/uL (ref 3.87–5.11)
RDW: 13.4 % (ref 11.5–15.5)
WBC: 6.6 10*3/uL (ref 4.0–10.5)

## 2018-08-13 LAB — COMPREHENSIVE METABOLIC PANEL
ALT: 21 U/L (ref 0–35)
AST: 18 U/L (ref 0–37)
Albumin: 4.3 g/dL (ref 3.5–5.2)
Alkaline Phosphatase: 92 U/L (ref 39–117)
BUN: 12 mg/dL (ref 6–23)
CHLORIDE: 98 meq/L (ref 96–112)
CO2: 27 meq/L (ref 19–32)
Calcium: 9.6 mg/dL (ref 8.4–10.5)
Creatinine, Ser: 0.78 mg/dL (ref 0.40–1.20)
GFR: 96.78 mL/min (ref >60.00)
GLUCOSE: 323 mg/dL — AB (ref 70–99)
Potassium: 4.4 mEq/L (ref 3.5–5.1)
Sodium: 135 mEq/L (ref 135–145)
Total Bilirubin: 0.3 mg/dL (ref 0.2–1.2)
Total Protein: 7.6 g/dL (ref 6.0–8.3)

## 2018-08-13 LAB — HEMOGLOBIN A1C: Hgb A1c MFr Bld: 8.2 % — ABNORMAL HIGH (ref 4.6–6.5)

## 2018-08-13 MED ORDER — PREDNISONE 20 MG PO TABS: 40.0000 mg | ORAL_TABLET | Freq: Every day | ORAL | 0 refills | Status: DC

## 2018-08-13 NOTE — Progress Notes (Signed)
Patient: Renee Knapp MRN: [id number] DOB: July 11, 1975 PCP: [name], MD     Subjective:  Chief Complaint  Patient presents with  . Sore Throat  . Ear Pain    HPI: The patient is a 43 y.o. female who presents today for sore throat. She as seen at urgent care on 08/02/2018 for headache, ear pain and a sore throat. Her strep is negative and was told she had a viral URI. She then came here and saw our PA on 08/06/2018 for continued sore throat. Flu test was negative and her strep was read as positive. She was treated with a round of amoxicillin. She has 6 pills left. She states the pain in her throat is the same. Ears are still hurting and she has blisters under tongue that she noticed on Saturday that have white patches on them. She is not using flonase. No other over the counter medication has been used. No fever/chills, N/V/D.   Diabetes: Patient is here for follow up of type 2 diabetes.  Currently on the following medications metformin ER 1500mg /day. Does not take medication as prescribed as she should be on 2000mg  since her last lab draw and is way overdue for follow up. Last A1C was 7.4. Not exercising and not following diet. Denies any hypoglycemic events. Denies any vision changes, nausea, vomiting, abdominal pain, ulcers/paraesthesia in feet, polyuria, polydipsia or polyphagia. Denies any chest pain, shortness of breath. Poor f/u. Way overdue for labs. She is on a statin.   Hypertension: elevated today. On no medication. Usually to goal on past visits.     Review of Systems  Constitutional: Positive for fatigue. Negative for chills and fever.  HENT: Positive for sore throat and trouble swallowing. Negative for congestion, postnasal drip, rhinorrhea, sinus pressure and sinus pain.   Respiratory: Positive for cough. Negative for shortness of breath.        Mild cough.  Pt states "to clear her throat"  Cardiovascular: Negative for chest pain.  Gastrointestinal: Negative for  abdominal pain, nausea and vomiting.  Musculoskeletal: Positive for neck pain. Negative for neck stiffness.       C/o right sided neck pain  Skin: Negative for rash.  Neurological: Positive for headaches. Negative for dizziness.    Allergies Patient is allergic to bee venom.  Past Medical History Patient  has a past medical history of Anemia, Diabetes mellitus without complication (HCC), Obesity, Plantar fasciitis, bilateral, and SVD (spontaneous vaginal delivery).  Surgical History Patient  has a past surgical history that includes Tubal ligation; Dilation and curettage of uterus; Wisdom tooth extraction; Laparoscopic assisted vaginal hysterectomy (N/A, 02/24/2014); and Cystoscopy (N/A, 02/24/2014).  Family History Pateint's family history includes Cancer in an other family member; Diabetes in her mother and another family member; Hearing loss in her son; Heart disease in her mother; High Cholesterol in her father; Hypertension in her father, mother, sister, sister, and another family member; Kidney disease in her sister; Stroke in her sister.  Social History Patient  reports that she has never smoked. She has never used smokeless tobacco. She reports that she does not drink alcohol or use drugs.    Objective: Vitals:   08/13/18 1401 08/13/18 1417  BP: (!) 144/102 (!) 134/94  Pulse: 92   Temp: 98.6 F (37 C)   TempSrc: Oral   SpO2: 98%   Weight: 213 lb (96.6 kg)   Height: 5\' 4"  (1.626 m)     Body mass index is 36.56 kg/m.  Physical Exam Vitals  signs reviewed.  Constitutional:      Appearance: She is well-developed. She is obese.  HENT:     Right Ear: Tympanic membrane and ear canal normal.     Left Ear: Tympanic membrane and ear canal normal.     Nose: No congestion or rhinorrhea.     Mouth/Throat:     Mouth: Mucous membranes are moist. No oral lesions.     Pharynx: No posterior oropharyngeal erythema.  Eyes:     Conjunctiva/sclera: Conjunctivae normal.  Neck:      Musculoskeletal: Normal range of motion and neck supple.  Cardiovascular:     Rate and Rhythm: Normal rate and regular rhythm.     Heart sounds: Normal heart sounds.  Abdominal:     General: Bowel sounds are normal.     Palpations: Abdomen is soft.  Lymphadenopathy:     Cervical: Cervical adenopathy present.  Skin:    General: Skin is warm and dry.     Capillary Refill: Capillary refill takes less than 2 seconds.  Neurological:     General: No focal deficit present.     Mental Status: She is alert and oriented to person, place, and time.        Assessment/plan: 1. Sore throat No abnormal findings on exam. No lesion, erythema, exudate. No congestion/sinus abnormalities.  Recommended she start flonase nightly, finish antibiotics and will do very short steroid burst with diabetes. Reassurance given. Let me know if worsening symptoms.   2. Controlled type 2 diabetes mellitus without complication, without long-term current use of insulin (HCC) Poor compliance. Way overdue and not taking medication as prescribed. Not exercising nor following diabetic diet. Will check labs and advised to increase her metformin to 2000mg /day. Will likely need second agent added on. F/u in 3 months with labs/appointment as this is likely not to goal. Also discussed importance of diet and exercise and weight loss. She has gained weight. Needs eye exam. I believe we have placed referral in past, but will wait till next appointment due to covid.  - Comprehensive metabolic panel - CBC with Differential/Platelet - Hemoglobin A1c   3. Elevated blood pressure Has been to goal on numerous past visits. She has gained weight. Will monitor over the next 3 months and f/u at next visit. Will start treatment if continues to be above goal for diabetes.    Return in about 3 months (around 11/13/2018) for diabetes/blood pressure .    [name], MD [facility]  08/13/2018

## 2018-08-13 NOTE — Patient Instructions (Addendum)
-  increase your metformin to 2 pills in AM and 2 pills in the PM.   -try to start eating healthy and get some exercise in!   -will likely need to see you back in 3 months for diabetic check as I suspect this will be high.   -throat and ear look fine. Start flonase nightly. Will do short course of steroids, but will make your sugars go high for a bit. Doing only 5 days.   -let me know if not getting better.   -watch your BP.. bottom number is high. Will f/u in 3 months on this.

## 2018-08-13 NOTE — Telephone Encounter (Signed)
Spoke to patient.  She states that she actually requested sick appt today.  States that she did not want webex visit.  She has ST, b/l ear pain, was seen on 3/16 and dx w/strep throat.  Just not feeling better, now c/o bumps under tongue that are painful and her throat feels like "it's on fire".    Pt has appt scheduled today for 2pm.

## 2018-08-15 ENCOUNTER — Encounter: Payer: Self-pay | Admitting: Family Medicine

## 2018-08-16 NOTE — Progress Notes (Signed)
Spoke to patient and she has viewed her labs via MyChart.  Advised her to contact her insurance co to see which medication (Ozempic or Trulicity) is covered and contact us back w/this info so that Dr. Artis Flock knows which medication to send in to pharmacy for her.  We can schedule nurse visit to show her how to self inject medication after knowing which medication is covered.  She verbalized understanding.

## 2018-08-17 ENCOUNTER — Telehealth: Payer: Self-pay

## 2018-08-17 NOTE — Telephone Encounter (Signed)
Spoke to patient who is requesting a letter from Dr. Artis Flock stating that she is high risk for exposure to covid since her only mode of transportation to work is public.  Letter needs to state that she needs to be able to work from home.

## 2018-08-17 NOTE — Telephone Encounter (Signed)
Sent MyChart message to patient informing her that her letter is ready for p/u at front desk.

## 2018-08-17 NOTE — Telephone Encounter (Signed)
Letter ready for pick up

## 2018-08-20 ENCOUNTER — Other Ambulatory Visit: Payer: Self-pay

## 2018-08-20 ENCOUNTER — Ambulatory Visit (INDEPENDENT_AMBULATORY_CARE_PROVIDER_SITE_OTHER): Payer: Managed Care, Other (non HMO)

## 2018-08-20 DIAGNOSIS — E1165 Type 2 diabetes mellitus with hyperglycemia: Secondary | ICD-10-CM | POA: Diagnosis not present

## 2018-08-20 MED ORDER — SEMAGLUTIDE(0.25 OR 0.5MG/DOS) 2 MG/1.5ML ~~LOC~~ SOPN: 0.5000 mg | PEN_INJECTOR | SUBCUTANEOUS | 0 refills | Status: DC

## 2018-08-20 NOTE — Progress Notes (Signed)
Patient here for teaching to self inject for Ozempic.  Demonstrated to patient all acceptable sites to self inject medication.  Reviewed technique, route of administration, sterile technique.  Sample of Ozempic given.  Instructed patient to self-inject 0.25 mg SQ weekly (preferably on same day each week) x 4 weeks and then increase dosage to 0.5 mg weekly.  Patient verbalized understanding of all instructions and had no further questions at this time.  Advised patient to see how she likes the medication and if seems to be working well, pls give [place] a call and we can send rx in to pharmacy for her.    Lab appt scheduled for 6/23 for fasting BMP and A1C and 3 mo diabetes f/u scheduled w/Dr. [name] for 6/26.

## 2018-08-30 ENCOUNTER — Encounter: Payer: Self-pay | Admitting: Family Medicine

## 2018-08-30 ENCOUNTER — Other Ambulatory Visit: Payer: Self-pay

## 2018-08-30 ENCOUNTER — Ambulatory Visit (INDEPENDENT_AMBULATORY_CARE_PROVIDER_SITE_OTHER): Payer: Managed Care, Other (non HMO) | Admitting: Family Medicine

## 2018-08-30 VITALS — Ht 64.0 in | Wt 213.0 lb

## 2018-08-30 DIAGNOSIS — F411 Generalized anxiety disorder: Secondary | ICD-10-CM | POA: Diagnosis not present

## 2018-08-30 MED ORDER — HYDROXYZINE HCL 25 MG PO TABS: 25.0000 mg | ORAL_TABLET | Freq: Three times a day (TID) | ORAL | 1 refills | Status: DC | PRN

## 2018-08-30 MED ORDER — FLUOXETINE HCL 20 MG PO CAPS: 20.0000 mg | ORAL_CAPSULE | Freq: Every day | ORAL | 1 refills | Status: DC
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
## 2018-09-02 ENCOUNTER — Other Ambulatory Visit: Payer: Self-pay | Admitting: Family Medicine

## 2018-09-21 ENCOUNTER — Other Ambulatory Visit: Payer: Self-pay | Admitting: Family Medicine

## 2018-09-26 ENCOUNTER — Encounter: Payer: Self-pay | Admitting: Family Medicine

## 2018-09-26 ENCOUNTER — Other Ambulatory Visit: Payer: Self-pay

## 2018-09-26 MED ORDER — SEMAGLUTIDE(0.25 OR 0.5MG/DOS) 2 MG/1.5ML ~~LOC~~ SOPN: 0.5000 mg | PEN_INJECTOR | SUBCUTANEOUS | 1 refills | Status: DC

## 2018-10-05 ENCOUNTER — Other Ambulatory Visit: Payer: Self-pay

## 2018-10-05 ENCOUNTER — Encounter: Payer: Self-pay | Admitting: Family Medicine

## 2018-10-05 ENCOUNTER — Ambulatory Visit: Payer: Managed Care, Other (non HMO) | Admitting: Family Medicine

## 2018-10-05 ENCOUNTER — Ambulatory Visit (INDEPENDENT_AMBULATORY_CARE_PROVIDER_SITE_OTHER): Payer: Managed Care, Other (non HMO) | Admitting: Family Medicine

## 2018-10-05 VITALS — BP 108/84 | HR 81 | Temp 97.7°F | Ht 64.0 in | Wt 204.0 lb

## 2018-10-05 DIAGNOSIS — M545 Low back pain, unspecified: Secondary | ICD-10-CM

## 2018-10-05 DIAGNOSIS — R51 Headache: Secondary | ICD-10-CM | POA: Diagnosis not present

## 2018-10-05 DIAGNOSIS — R3129 Other microscopic hematuria: Secondary | ICD-10-CM

## 2018-10-05 DIAGNOSIS — R519 Headache, unspecified: Secondary | ICD-10-CM

## 2018-10-05 DIAGNOSIS — R350 Frequency of micturition: Secondary | ICD-10-CM

## 2018-10-05 LAB — POCT URINALYSIS DIPSTICK
Bilirubin, UA: NEGATIVE
Blood, UA: POSITIVE
Glucose, UA: NEGATIVE
Ketones, UA: NEGATIVE
Leukocytes, UA: NEGATIVE
Nitrite, UA: NEGATIVE
Protein, UA: NEGATIVE
Spec Grav, UA: 1.02 (ref 1.010–1.025)
Urobilinogen, UA: 0.2 E.U./dL
pH, UA: 6 (ref 5.0–8.0)

## 2018-10-05 MED ORDER — NITROFURANTOIN MONOHYD MACRO 100 MG PO CAPS: 100.0000 mg | ORAL_CAPSULE | Freq: Two times a day (BID) | ORAL | 0 refills | Status: DC

## 2018-10-05 MED ORDER — CYCLOBENZAPRINE HCL 10 MG PO TABS: 10.0000 mg | ORAL_TABLET | Freq: Three times a day (TID) | ORAL | 0 refills | Status: DC | PRN

## 2018-10-05 NOTE — Patient Instructions (Addendum)
-treating for UTI even though I don't think you have one, but since it's the weekend i'll start you on medication. F/u on culture.   If n/v/fever, worsening back pain: go to ER. Let me know how you are doing on Monday. Again, I don't think you have a stone, but it's in the differential.   Tension headache: can take tylenol or ibuprofen. See below. Work on sleep/diet. Take your prozac daily.   Tension Headache, Adult A tension headache is pain, pressure, or aching in your head. Tension headaches can last from 30 minutes to several days. Follow these instructions at home: Managing pain  Take over-the-counter and prescription medicines only as told by your doctor.  When you have a headache, lie down in a dark, quiet room.  If told, put ice on your head and neck: ? Put ice in a plastic bag. ? Place a towel between your skin and the bag. ? Leave the ice on for 20 minutes, 2-3 times a day.  If told, put heat on the back of your neck. Do this as often as your doctor tells you to. Use the kind of heat that your doctor recommends, such as a moist heat pack or a heating pad. ? Place a towel between your skin and the heat. ? Leave the heat on for 20-30 minutes. ? Remove the heat if your skin turns bright red. Eating and drinking  Eat meals on a regular schedule.  Watch how much alcohol you drink: ? If you are a woman and are not pregnant, do not drink more than 1 drink a day. ? If you are a man, do not drink more than 2 drinks a day.  Drink enough fluid to keep your pee (urine) pale yellow.  Do not use a lot of caffeine, or stop using caffeine. Lifestyle  Get enough sleep. Get 7-9 hours of sleep each night. Or get the amount of sleep that your doctor tells you to.  At bedtime, remove all electronic devices from your room. Examples of electronic devices are computers, phones, and tablets.  Find ways to lessen your stress. Some things that can lessen stress are: ? Exercise. ? Deep  breathing. ? Yoga. ? Music. ? Positive thoughts.  Sit up straight. Do not tighten (tense) your muscles.  Do not use any products that have nicotine or tobacco in them, such as cigarettes and e-cigarettes. If you need help quitting, ask your doctor. General instructions   Keep all follow-up visits as told by your doctor. This is important.  Avoid things that can bring on headaches. Keep a journal to find out if certain things bring on headaches. For example, write down: ? What you eat and drink. ? How much sleep you get. ? Any change to your diet or medicines. Contact a doctor if:  Your headache does not get better.  Your headache comes back.  You have a headache and sounds, light, or smells bother you.  You feel sick to your stomach (nauseous) or you throw up (vomit).  Your stomach hurts. Get help right away if:  You suddenly get a very bad headache along with any of these: ? A stiff neck. ? Feeling sick to your stomach. ? Throwing up. ? Feeling weak. ? Trouble seeing. ? Feeling short of breath. ? A rash. ? Feeling unusually sleepy. ? Trouble speaking. ? Pain in your eye or ear. ? Trouble walking or balancing. ? Feeling like you will pass out (faint). ? Passing out. Summary  A tension headache is pain, pressure, or aching in your head.  Tension headaches can last from 30 minutes to several days.  Lifestyle changes and medicines may help relieve pain. This information is not intended to replace advice given to you by your health care provider. Make sure you discuss any questions you have with your health care provider. Document Released: 08/03/2009 Document Revised: 08/19/2016 Document Reviewed: 08/19/2016 Elsevier Interactive Patient Education  2019 [employer]

## 2018-10-05 NOTE — Progress Notes (Signed)
Patient: Renee Knapp MRN: [id number] DOB: [birth year] PCP: [name], MD     Subjective:  Chief Complaint  Patient presents with  . Back Pain  . Urinary Frequency  . Urinary Urgency    HPI: The patient is a 43 y.o. female who presents today for urinary frequency and right lower back pain. She feels like she has pressure in her back from holding her pee. She states this started yesterday. She has no visible blood in her urine and no dysuria. She does have increased urgency to go to the bathroom. When she gets to the bathroom she feels like her stream is decreased. She doesn't feel like she has to strain to get urine out. She has no fever/chills. Not sexually active and no vaginal discharge. No N/V/D. She does have a headache. She had a CT done in February which was all normal. No stones. This pain is different from that time as the pain is all in her back. She has taken 2 tylenols and this did help some. Pain is all across her lower back, not really in her CVAs. Pain is constant in nature and she rates it as a 9/10 and is dull in nature. No precipitating factors. No exercise/strain. She has not radicular symptoms in her legs, no weakness, no numbness and no gait instability. Denies urinary incontinence.   She is also having headaches. Headache is above her right eye. She has noticed more headaches since being on ozempic. She has been on ozempic x 7 weeks. She is getting them weekly. She has had this one for 2 days. She is only sleeping 2 hours due to fear of her apartment complex. She states tylenol does help. It usually is on the right front part of her head. No vision changes/photophobia. No N/V. She has no phonophobia. She is working. Under more stress lately. No alcohol.. She has cut back on her caffeine. She has only had 2 sodas since she saw me. She has no focal deficits, no thunderclap headache and they do not wake her up in the middle of the night.   Review of Systems   Constitutional: Negative for chills and fever.  Respiratory: Negative for shortness of breath.   Cardiovascular: Negative for chest pain.  Gastrointestinal: Negative for abdominal pain, blood in stool, nausea and vomiting.  Genitourinary: Positive for frequency and urgency. Negative for difficulty urinating, dysuria, flank pain, hematuria, pelvic pain, vaginal discharge and vaginal pain.  Musculoskeletal: Positive for back pain.       C/o lower back pain x 24 hrs  Neurological: Positive for headaches. Negative for dizziness, facial asymmetry, speech difficulty, weakness, light-headedness and numbness.    Allergies Patient is allergic to bee venom.  Past Medical History Patient  has a past medical history of Anemia, Diabetes mellitus without complication (HCC), Obesity, Plantar fasciitis, bilateral, and SVD (spontaneous vaginal delivery).  Surgical History Patient  has a past surgical history that includes Tubal ligation; Dilation and curettage of uterus; Wisdom tooth extraction; Laparoscopic assisted vaginal hysterectomy (N/A, 02/24/2014); and Cystoscopy (N/A, 02/24/2014).  Family History Pateint's family history includes Cancer in an other family member; Diabetes in her mother and another family member; Hearing loss in her son; Heart disease in her mother; High Cholesterol in her father; Hypertension in her father, mother, sister, sister, and another family member; Kidney disease in her sister; Stroke in her sister.  Social History Patient  reports that she has never smoked. She has never used smokeless tobacco. She reports that  she does not drink alcohol or use drugs.    Objective: Vitals:   10/05/18 1416  BP: 108/84  Pulse: 81  Temp: 97.7 F (36.5 C)  TempSrc: Oral  SpO2: 97%  Weight: 204 lb (92.5 kg)  Height: 5\' 4"  (1.626 m)    Body mass index is 35.02 kg/m.  Physical Exam Vitals signs reviewed.  Constitutional:      Appearance: Normal appearance.  HENT:     Head:  Normocephalic and atraumatic.     Nose: Nose normal.  Eyes:     Extraocular Movements: Extraocular movements intact.     Pupils: Pupils are equal, round, and reactive to light.  Cardiovascular:     Rate and Rhythm: Normal rate and regular rhythm.     Heart sounds: Normal heart sounds.  Pulmonary:     Effort: Pulmonary effort is normal. No respiratory distress.     Breath sounds: Normal breath sounds. No wheezing or rales.  Abdominal:     General: Abdomen is flat. Bowel sounds are normal. There is no distension.     Palpations: Abdomen is soft.     Tenderness: There is no abdominal tenderness. There is no right CVA tenderness or left CVA tenderness.  Musculoskeletal: Normal range of motion.     Comments: TTP across entire lower back around L1-2 from right paraspinal, spinal process to left paraspinal. No pain with hard palpation of kidney/CVA area.   She has 5/5 strength in bilateral lower legs. Sensation intact   Skin:    Capillary Refill: Capillary refill takes less than 2 seconds.  Neurological:     General: No focal deficit present.     Mental Status: She is alert and oriented to person, place, and time.     Cranial Nerves: No cranial nerve deficit.     Sensory: No sensory deficit.     Motor: No weakness.     Coordination: Coordination normal.     Gait: Gait normal.     Deep Tendon Reflexes: Reflexes normal.        Assessment/plan: 1. Urinary frequency UA not very impressive at all. Positive for blood, but all else normal. Sending for micro to confirm blood/RBC. Since it's the weekend will treat for UTI with macrobid and f/u on culture, but told her this is not very convincing for UTI. If hematuria, will repeat and if positive will need urology referral. Did discuss this with her.  - Urine Culture - POCT Urinalysis Dipstick  2. Acute bilateral low back pain without sciatica Less than 24 hour/pain. Exam with no red flags and does respond to tylenol. Did discuss that a  kidney stone is on differential, but again, this is not high on my differential. Will do conservative therapy with rest, heating pad, tylenol/motrin, rest. Will also do trial of muscle relaxer to see if this helps. Drowsy precautions given. Very strict ER precautions given: n/v/fever/worsening pain etc she is to go. Will f/u on Monday.   3. Headache above the eye region likely tension headache. She has a plethora of possible reasons to have a headache. First I want her to try to get more than 2 hours/sleep a night, decrease stress, increase water and work on diet and exercise. I also want her to stop taking her prozac prn as this could be contributing. Discussed that this is not a prn medication. I also want her to check blood sugars at home. She has lost 9 pounds, changed diet a little and has been on ozempic  x 7 weeks. ER precautions given.   4. Hematuria, microscopic Sending for micro. If positive, will repeat and send to urology if warranted.  - Urinalysis, Routine w reflex microscopic   Return if symptoms worsen or fail to improve.   Orland MustardAllison Fernanda Twaddell, MD [facility]   10/05/2018

## 2018-10-06 LAB — URINALYSIS, ROUTINE W REFLEX MICROSCOPIC
Bacteria, UA: NONE SEEN /HPF
Bilirubin Urine: NEGATIVE
Glucose, UA: NEGATIVE
Hgb urine dipstick: NEGATIVE
Hyaline Cast: NONE SEEN /LPF
Ketones, ur: NEGATIVE
Nitrite: NEGATIVE
Protein, ur: NEGATIVE
Specific Gravity, Urine: 1.017 (ref 1.001–1.03)
Squamous Epithelial / LPF: NONE SEEN /HPF (ref <=5)
pH: 5.5 (ref 5.0–8.0)

## 2018-10-06 LAB — URINE CULTURE
MICRO NUMBER:: 479115
SPECIMEN QUALITY:: ADEQUATE

## 2018-10-08 ENCOUNTER — Other Ambulatory Visit: Payer: Self-pay

## 2018-10-08 DIAGNOSIS — E1165 Type 2 diabetes mellitus with hyperglycemia: Secondary | ICD-10-CM

## 2018-10-08 MED ORDER — ACCU-CHEK MULTICLIX LANCETS MISC: 1 refills | Status: DC

## 2018-10-08 NOTE — Progress Notes (Signed)
Orders placed for lancets and sent [facility] to [facility] [address]

## 2018-10-09 ENCOUNTER — Encounter: Payer: Self-pay | Admitting: Family Medicine

## 2018-10-11 ENCOUNTER — Other Ambulatory Visit: Payer: Self-pay

## 2018-10-11 MED ORDER — ACCU-CHEK FASTCLIX LANCETS MISC: 1 refills | Status: DC

## 2018-10-19 ENCOUNTER — Other Ambulatory Visit: Payer: Self-pay | Admitting: Family Medicine

## 2018-10-19 ENCOUNTER — Other Ambulatory Visit: Payer: Self-pay

## 2018-10-19 ENCOUNTER — Telehealth: Payer: Self-pay | Admitting: Family Medicine

## 2018-10-19 MED ORDER — GLUCOSE BLOOD VI STRP: ORAL_STRIP | 12 refills | Status: DC

## 2018-10-19 NOTE — Progress Notes (Signed)
contou

## 2018-10-19 NOTE — Telephone Encounter (Signed)
Please let her know that she doesn't need to be checking her sugars daily. She is not on insulin so there is no self correction/short acting insulin she needs to be giving on a daily basis.  I would only check if she doesn't feel good.  Can get test strips over the counter. Doesn't need a bulk supply. [name], MD [facility]

## 2018-10-19 NOTE — Telephone Encounter (Signed)
See note

## 2018-10-19 NOTE — Telephone Encounter (Signed)
Copied from CRM [id number]. Topic: Quick Communication - Rx Refill/Question >> Oct 19, 2018 12:31 PM Fanny Bien wrote: Medication: pt called and stated that she pick up needles to check blood sugar but still needs the test strips sent in. Please advise  Has the patient contacted their pharmacy? Yes Preferred Pharmacy (with phone number or street name): [id number] [place], [place] - [phone] (Phone) [phone] (Fax)

## 2018-10-19 NOTE — Telephone Encounter (Signed)
Spoke to patient and advised per Dr. Artis Flock that we will send in rx for Contour test strips 50 ct.  Patient verbalized understanding.

## 2018-10-22 ENCOUNTER — Ambulatory Visit: Payer: Self-pay | Admitting: Family Medicine

## 2018-11-05 ENCOUNTER — Telehealth: Payer: Self-pay | Admitting: Physical Therapy

## 2018-11-05 NOTE — Telephone Encounter (Signed)
Copied from [place] [id number]. Topic: General - Other >> Nov 05, 2018  2:25 PM Rainey Pines A wrote: Patient would like to speak with nurse in regards to COVID testing. Patient stated that her neighbor came over to her home this weekend and was tested for COVID. Patient stated that she may just be paranoid but patient did vomit last week.

## 2018-11-05 NOTE — Telephone Encounter (Signed)
Attempted to reach patient on cell phone # provided but there was n/a and I was not able to leave message due to mailbox not being set up.

## 2018-11-06 NOTE — Telephone Encounter (Signed)
Spoke with patient and she had contact w/neighbor on Saturday 6/13 who may have covid.  Neighbor has been tested and will find out results tomorrow 6/17.    Patient is currently asymptomatic but does admit to having one episode of vomiting and slight chills on the evening of 6/13.  Advised to self monitor for symptoms of fever, cough, SOB, chest pain, muscle pain, etc and call us back if develops any of these symptoms.  Also provided patient w/address of 2 covid testing sites in case she does develop any of above listed symptoms and feels that she needs to be tested.  Patient verbalized understanding and had no further questions at this time.  Will route message to Dr. Jonni Sanger for review.

## 2018-11-06 NOTE — Telephone Encounter (Signed)
Agree. Likely will qualify for testing.

## 2018-11-13 ENCOUNTER — Ambulatory Visit: Payer: Self-pay | Admitting: Family Medicine

## 2018-11-13 ENCOUNTER — Other Ambulatory Visit: Payer: Self-pay

## 2018-11-13 ENCOUNTER — Other Ambulatory Visit (INDEPENDENT_AMBULATORY_CARE_PROVIDER_SITE_OTHER): Payer: Managed Care, Other (non HMO)

## 2018-11-13 DIAGNOSIS — E1165 Type 2 diabetes mellitus with hyperglycemia: Secondary | ICD-10-CM

## 2018-11-13 LAB — HEMOGLOBIN A1C: Hgb A1c MFr Bld: 6.7 % — ABNORMAL HIGH (ref 4.6–6.5)

## 2018-11-15 ENCOUNTER — Encounter: Payer: Self-pay | Admitting: Family Medicine

## 2018-11-15 IMAGING — DX DG HAND COMPLETE 3+V*R*
3 series · 3 of 3 positions shown · non-contrast
Comparison: Right wrist x-rays dated August 27, 2017.

CLINICAL DATA: Right hand pain after fall while skating yesterday.

EXAM:
RIGHT HAND - COMPLETE 3+ VIEW

[hand pa]
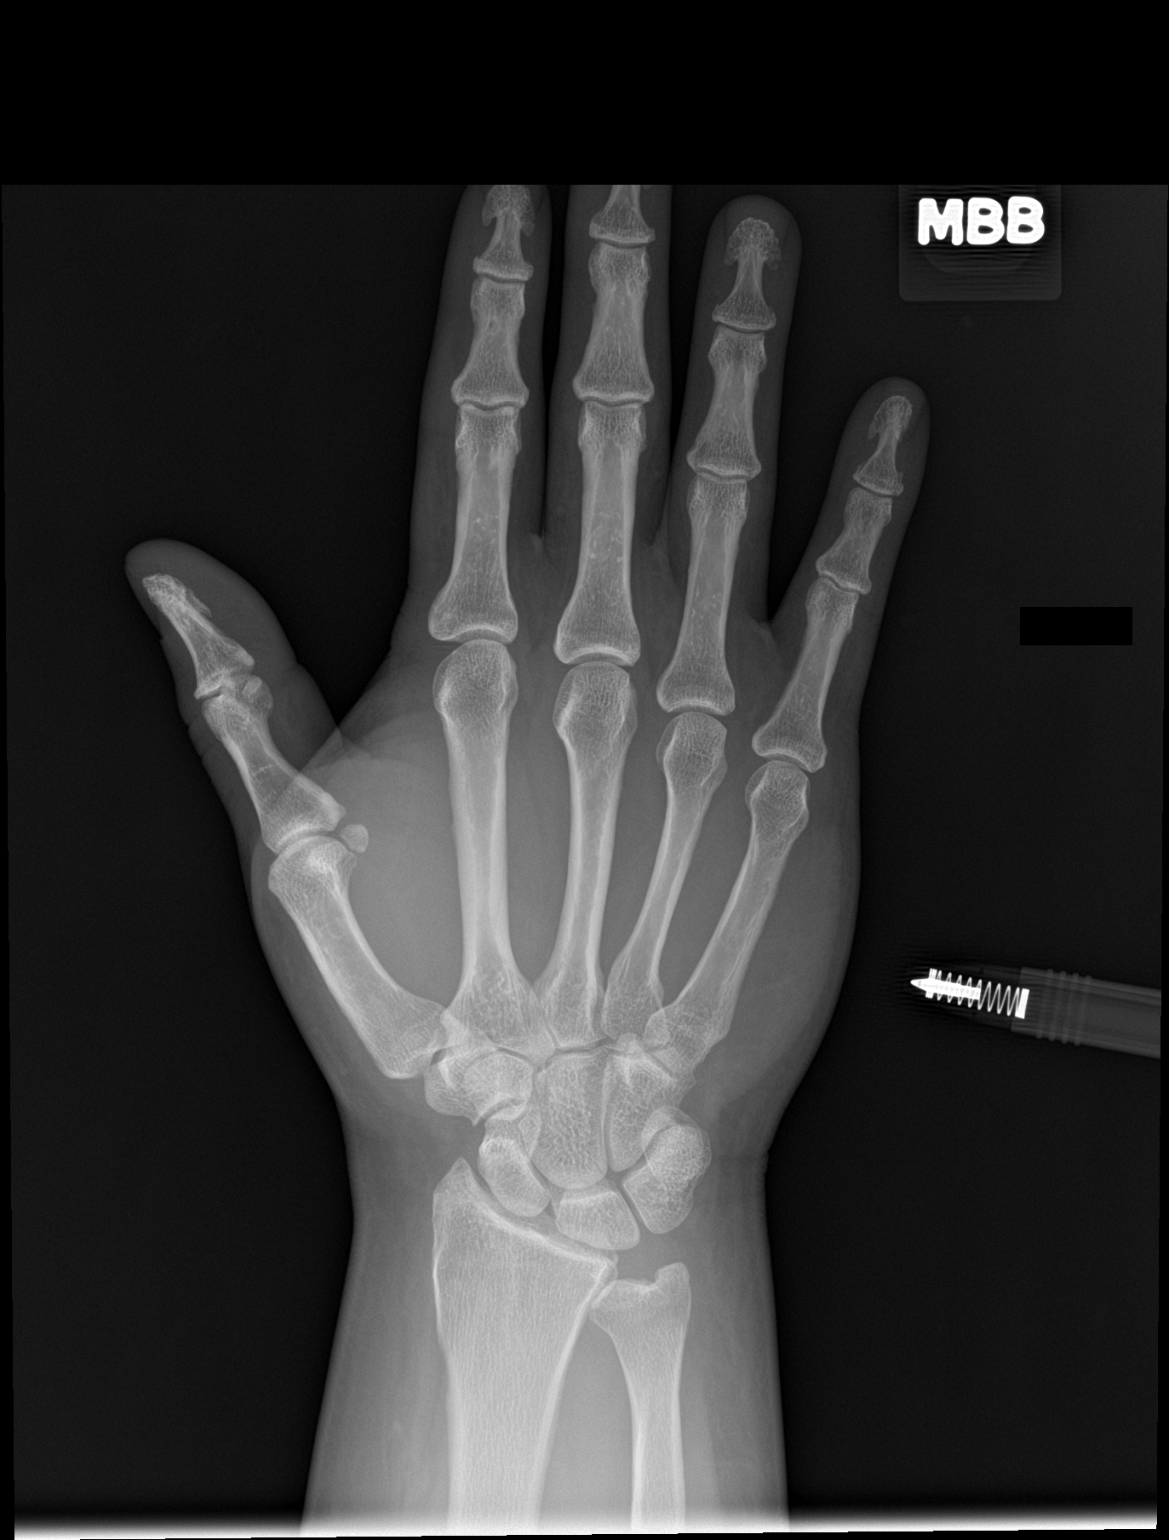

[hand obl]
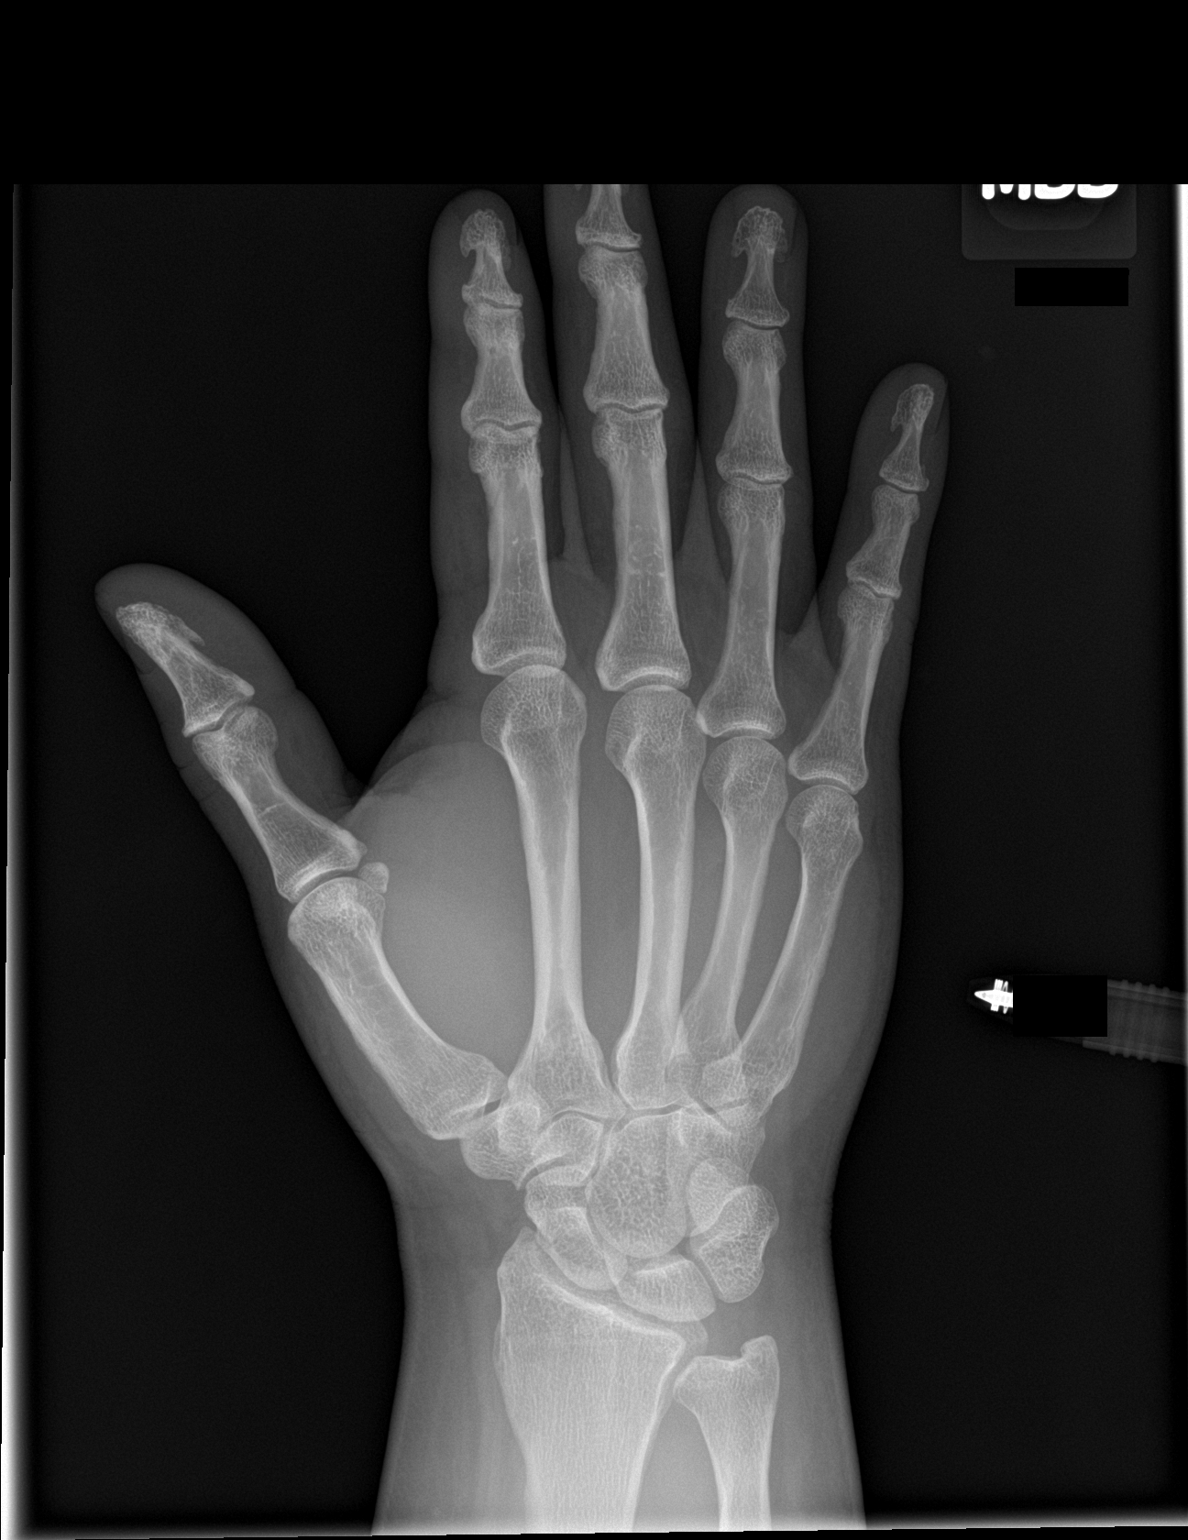

[hand lat]
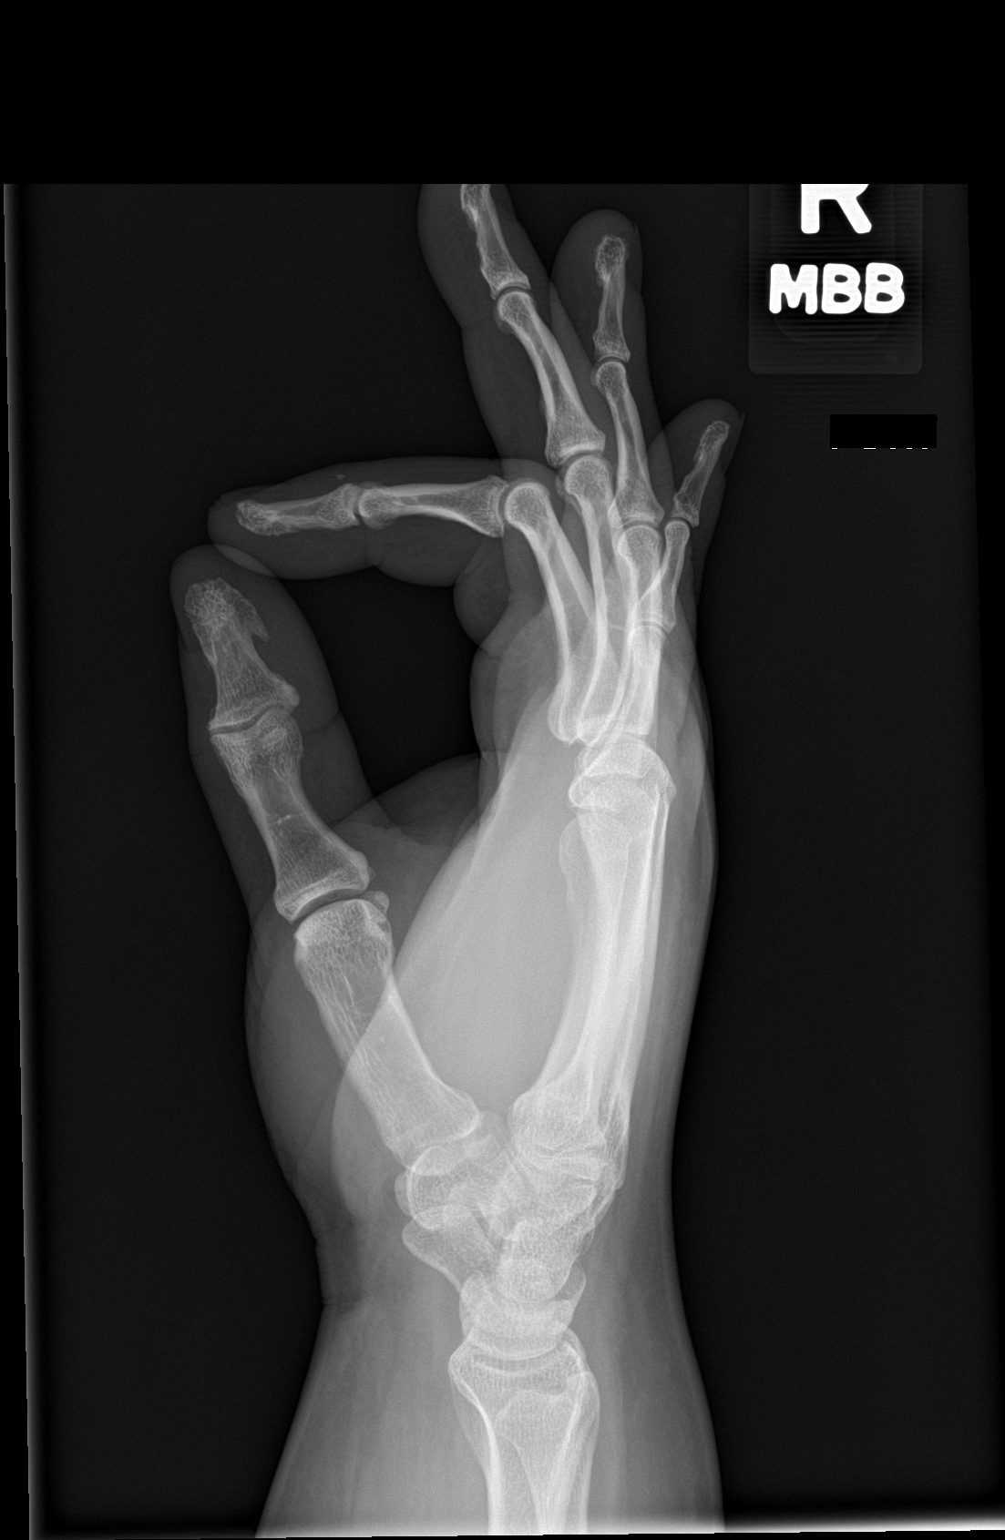

[3 of 3 positions shown; findings below may reference images not displayed]

FINDINGS: There is no evidence of fracture or dislocation. There is no
evidence of arthropathy or other focal bone abnormality. Unchanged
ulnar minus variance. Soft tissues are unremarkable.
IMPRESSION: Negative.

## 2018-11-16 ENCOUNTER — Ambulatory Visit (INDEPENDENT_AMBULATORY_CARE_PROVIDER_SITE_OTHER): Payer: Managed Care, Other (non HMO) | Admitting: Family Medicine

## 2018-11-16 ENCOUNTER — Encounter: Payer: Self-pay | Admitting: Family Medicine

## 2018-11-16 VITALS — Wt 196.0 lb

## 2018-11-16 DIAGNOSIS — E119 Type 2 diabetes mellitus without complications: Secondary | ICD-10-CM | POA: Diagnosis not present

## 2018-11-16 DIAGNOSIS — R229 Localized swelling, mass and lump, unspecified: Secondary | ICD-10-CM

## 2018-11-16 NOTE — Progress Notes (Signed)
    Chief Complaint:  Renee Knapp is a 43 y.o. female who presents today for a virtual office visit with a chief complaint of T2DM.   Assessment/Plan:  T2DM Congratulated patient on recent weight loss and A1c of 6.7.  We will continue metformin 1000 mg twice daily and Ozempic 0.5 mg weekly.  She will follow-up with her PCP in 3 months to have A1c rechecked.  Skin Lump New problem. Based on history sounds like it could be a sebaceous cyst.  She will come into the office next week to have further evaluation.  If consistent with sebaceous cyst, would consider performing I&D with excision at that time.    Subjective:  HPI:  # T2DM - On metformin 1000mg  twice daily and ozempic 0.5mg  weekly. Tolerating well with no reported side effects.  - Has been eating smaller portions. - Does not exercise routinely  She has lost about 8 pounds over the last few months.   # Skin Lump Located her under her left armpit.  Started about a month ago.  Increasing in size over that time.  Does not hurt.  No drainage.  But something similar in the past that was removed.  She thinks that this was a cyst.  No obvious precipitating events.  No specific treatments tried.  No other obvious alleviating or aggravating factors.   ROS: Per HPI  PMH: She reports that she has never smoked. She has never used smokeless tobacco. She reports that she does not drink alcohol or use drugs.      Objective/Observations  Physical Exam: Gen: NAD, resting comfortably Pulm: Normal work of breathing Neuro: Grossly normal, moves all extremities Psych: Normal affect and thought content  Virtual Visit via Video   I connected with Renee Knapp on 11/16/18 at  1:20 PM EDT by a video enabled telemedicine application and verified that I am speaking with the correct person using two identifiers. I discussed the limitations of evaluation and management by telemedicine and the availability of in person appointments. The patient  expressed understanding and agreed to proceed.   Patient location: Home Provider location: [place] participating in the virtual visit: Myself and Patient     Algis Greenhouse. Jerline Pain, MD 11/16/2018 12:21 PM

## 2018-11-18 IMAGING — CR DG WRIST COMPLETE 3+V*R*
4 series · 4 of 4 positions shown · non-contrast
Comparison: 12/09/2017, 08/27/2017

CLINICAL DATA: Fall with continued pain

EXAM:
RIGHT WRIST - COMPLETE 3+ VIEW

[x wrist pa right]
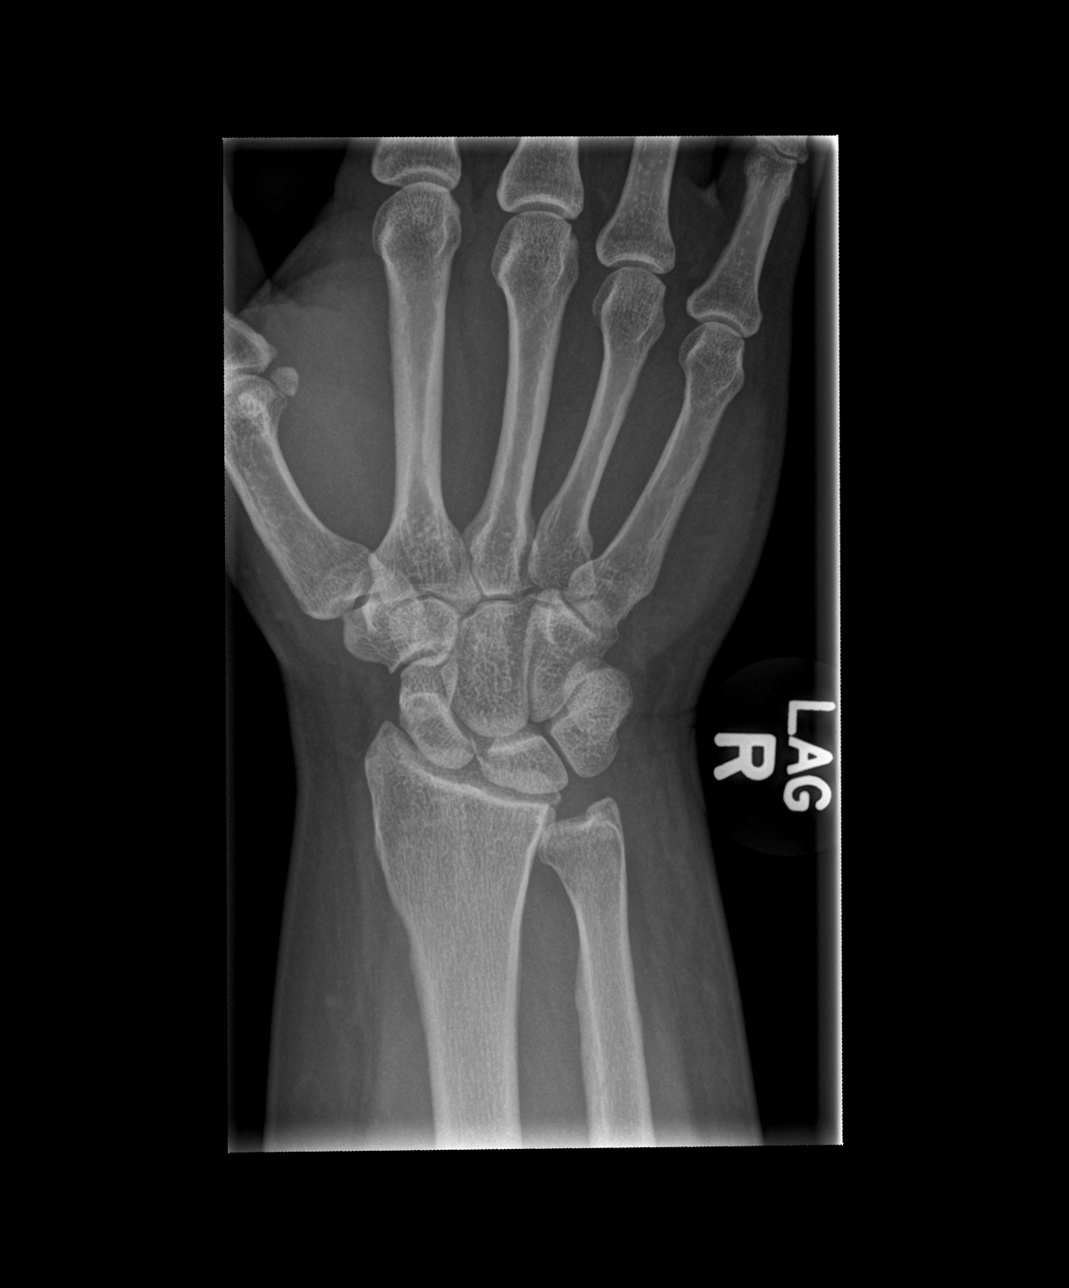

[x wrist obl right]
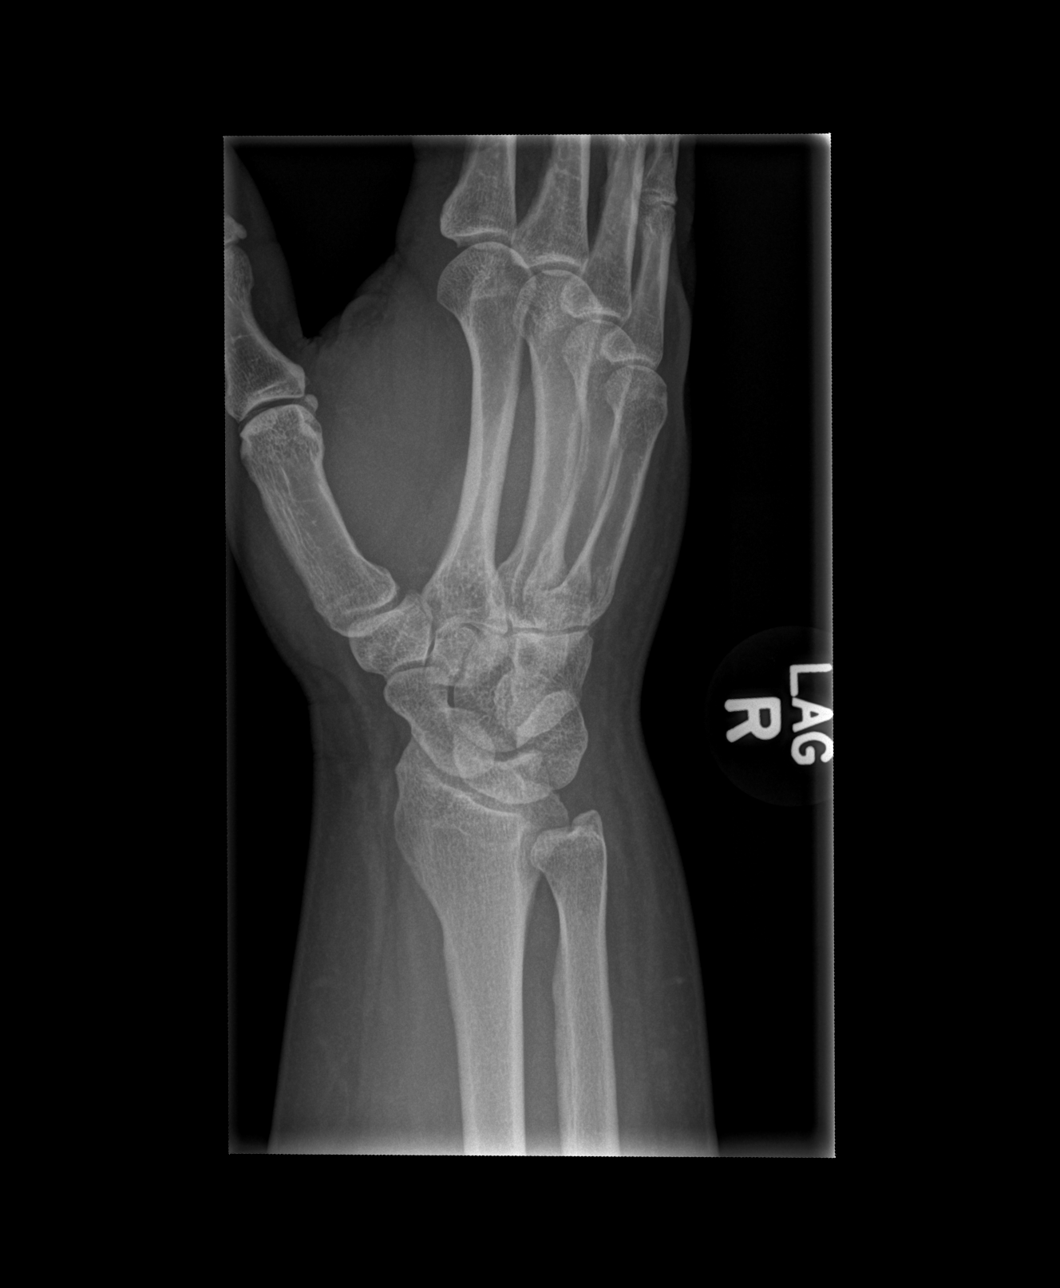

[x wrist lat right]
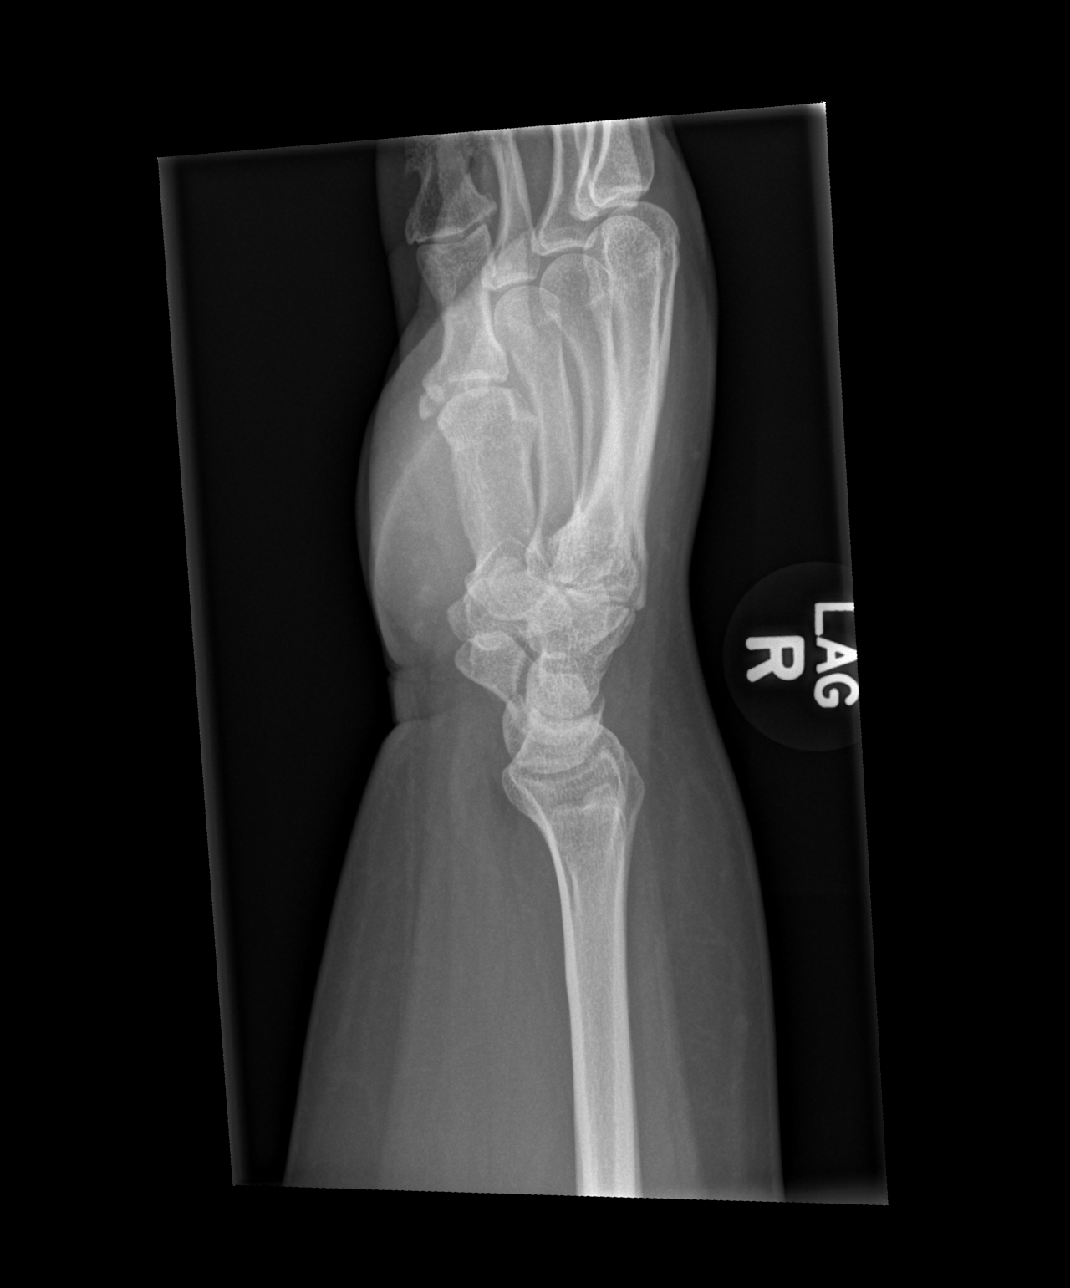

[x wrist navicular view right]
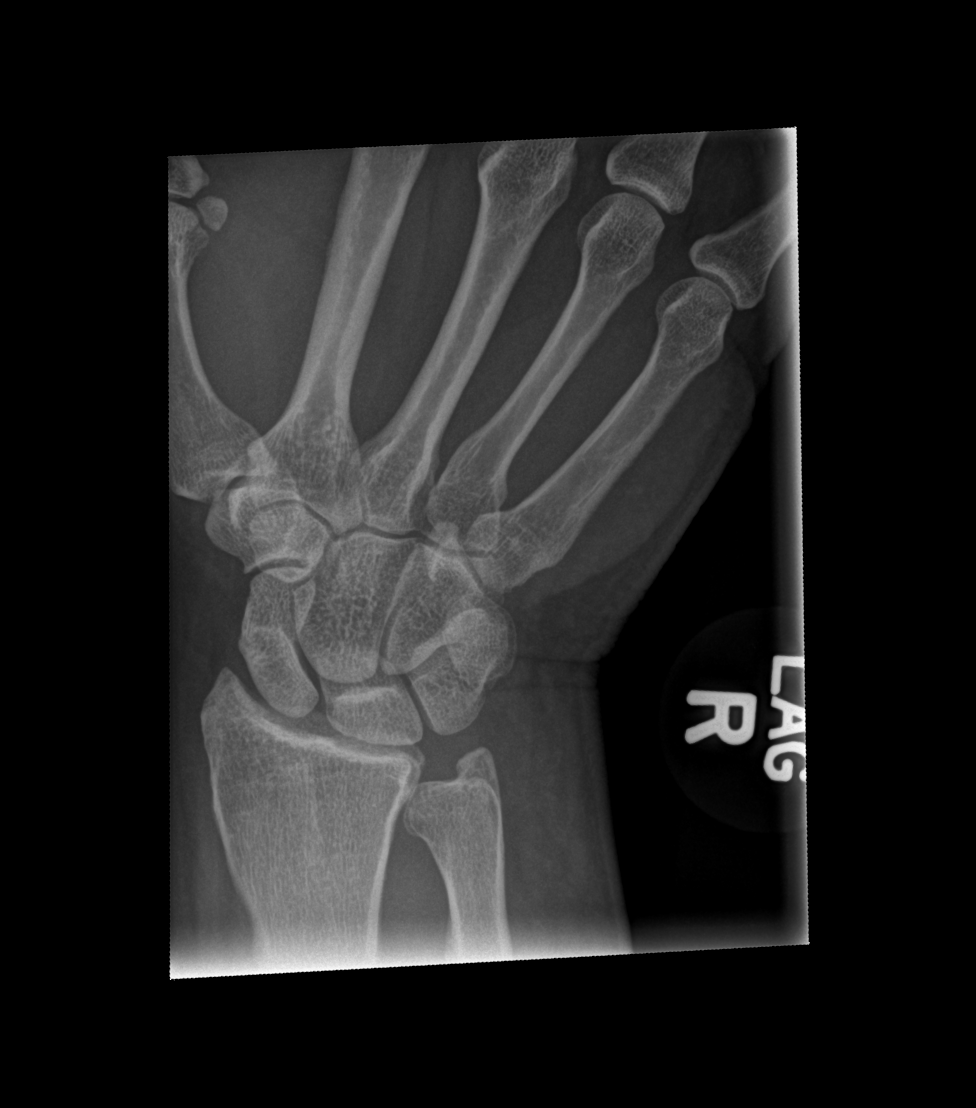

[4 of 4 positions shown; findings below may reference images not displayed]

FINDINGS: There is no evidence of fracture or dislocation. There is no
evidence of arthropathy or other focal bone abnormality. Soft
tissues are unremarkable.
IMPRESSION: Negative.

## 2018-12-03 ENCOUNTER — Other Ambulatory Visit: Payer: Self-pay | Admitting: Family Medicine

## 2018-12-03 NOTE — Telephone Encounter (Signed)
Recently evaluated by Dr. Jerline Pain in [facility] abscence. Please see refill request.  Notes say she is on met 1000 bid. Refill request is for met ER formuation. Please clarify and refill as you see appropriate. Thanks!

## 2018-12-04 NOTE — Telephone Encounter (Signed)
Rx sent to pharmacy   

## 2018-12-04 NOTE — Telephone Encounter (Signed)
Ok with me. Please place any necessary orders. 

## 2018-12-06 ENCOUNTER — Other Ambulatory Visit: Payer: Self-pay | Admitting: Family Medicine

## 2018-12-06 NOTE — Telephone Encounter (Signed)
Due for repeat lipids in September.

## 2019-01-26 ENCOUNTER — Other Ambulatory Visit: Payer: Self-pay | Admitting: Family Medicine

## 2019-01-29 NOTE — Telephone Encounter (Signed)
Dr.Wolfe's patient.  

## 2019-02-15 ENCOUNTER — Encounter: Payer: Self-pay | Admitting: Family Medicine

## 2019-02-15 ENCOUNTER — Other Ambulatory Visit: Payer: Self-pay

## 2019-02-15 ENCOUNTER — Ambulatory Visit (INDEPENDENT_AMBULATORY_CARE_PROVIDER_SITE_OTHER): Payer: Managed Care, Other (non HMO) | Admitting: Family Medicine

## 2019-02-15 VITALS — BP 118/86 | HR 86 | Temp 98.6°F | Ht 64.0 in | Wt 196.6 lb

## 2019-02-15 DIAGNOSIS — E559 Vitamin D deficiency, unspecified: Secondary | ICD-10-CM | POA: Diagnosis not present

## 2019-02-15 DIAGNOSIS — Z0001 Encounter for general adult medical examination with abnormal findings: Secondary | ICD-10-CM | POA: Diagnosis not present

## 2019-02-15 DIAGNOSIS — E119 Type 2 diabetes mellitus without complications: Secondary | ICD-10-CM | POA: Diagnosis not present

## 2019-02-15 DIAGNOSIS — E782 Mixed hyperlipidemia: Secondary | ICD-10-CM | POA: Diagnosis not present

## 2019-02-15 DIAGNOSIS — Z23 Encounter for immunization: Secondary | ICD-10-CM

## 2019-02-15 DIAGNOSIS — F411 Generalized anxiety disorder: Secondary | ICD-10-CM | POA: Diagnosis not present

## 2019-02-15 DIAGNOSIS — L723 Sebaceous cyst: Secondary | ICD-10-CM

## 2019-02-15 DIAGNOSIS — Z0000 Encounter for general adult medical examination without abnormal findings: Secondary | ICD-10-CM

## 2019-02-15 MED ORDER — METFORMIN HCL ER 500 MG PO TB24: 1000.0000 mg | ORAL_TABLET | Freq: Two times a day (BID) | ORAL | 1 refills | Status: DC

## 2019-02-15 MED ORDER — EPINEPHRINE 0.15 MG/0.3ML IJ SOAJ: 0.3000 mg | Freq: Once | INTRAMUSCULAR | 1 refills | Status: DC | PRN

## 2019-02-15 NOTE — Addendum Note (Signed)
Addended by: Tomi Likens on: 02/15/2019 03:26 PM   Modules accepted: Orders

## 2019-02-15 NOTE — Addendum Note (Signed)
Addended by: Tomi Likens on: 02/15/2019 03:27 PM   Modules accepted: Orders

## 2019-02-15 NOTE — Progress Notes (Signed)
Patient: Renee Knapp MRN: [id number] DOB: Oct 30, 1975 PCP: Orma Flaming, MD     Subjective:  Chief Complaint  Patient presents with  . Annual Exam  . Diabetes  . Anxiety  . Mass    left arm pit  . Hyperlipidemia    HPI: The patient is a 43 y.o. female who presents today for annual exam. She denies any changes to past medical history. There have been no recent hospitalizations. They are following a well balanced diet and exercise plan. Weight has been decreasing steadily.   No first degree relative with colon cancer. No breast cancer in first degree relative.   Diabetes: Patient is here for follow up of type 2 diabetes.  Currently on the following medications metformin xR and ozempic. Takes medications as prescribed. Last A1C was 6.7. Currently exercising and following diabetic diet.  Denies any hypoglycemic events. Denies any vision changes, nausea, vomiting, abdominal pain, ulcers/paraesthesia in feet, polyuria, polydipsia or polyphagia. Denies any chest pain, shortness of breath.    Hyperlipidemia: hx of diabetes and has been taking crestor with no issue or side effects. Her dad has high cholesterol. She does not  Smoke.   Anxiety: She is currently on prozac. She is doing well. She does have panic attacks, but is able to deal with them well. She is walking daily.   Lump under her left arm: she had a lump under her left arm. She had it removed in 2016 and was told it wouldn't come back, but it has in the same place. She thinks it was a sebaceous cyst. Not red, hot. It is hard and she states she can not move it. It is uncomfortable.    Immunization History  Administered Date(s) Administered  . Influenza,inj,Quad PF,6+ Mos 02/25/2014  . Influenza-Unspecified 03/19/2018  . Pneumococcal Polysaccharide-23 03/05/2013, 11/10/2017  . Tdap 10/10/2003, 11/12/2014, 11/10/2017   Colonoscopy: needs at 48 years.  Mammogram: due for this.  Pap smear: hysterectomy    Review of  Systems  Constitutional: Negative for chills, fatigue and fever.  HENT: Negative for dental problem, ear pain, hearing loss and trouble swallowing.   Eyes: Negative for visual disturbance.  Respiratory: Negative for cough, chest tightness and shortness of breath.   Cardiovascular: Negative for chest pain, palpitations and leg swelling.  Gastrointestinal: Negative for abdominal pain, blood in stool, diarrhea, nausea and vomiting.  Endocrine: Negative for cold intolerance, polydipsia, polyphagia and polyuria.  Genitourinary: Negative for dysuria and hematuria.  Musculoskeletal: Negative for arthralgias.  Skin: Negative for rash.       Lump in left axillae   Neurological: Negative for dizziness and headaches.  Psychiatric/Behavioral: Negative for dysphoric mood and sleep disturbance. The patient is not nervous/anxious.     Allergies Patient is allergic to bee venom.  Past Medical History Patient  has a past medical history of Anemia, Diabetes mellitus without complication ([place]), Obesity, Plantar fasciitis, bilateral, and SVD (spontaneous vaginal delivery).  Surgical History Patient  has a past surgical history that includes Tubal ligation; Dilation and curettage of uterus; Wisdom tooth extraction; Laparoscopic assisted vaginal hysterectomy (N/A, 02/24/2014); and Cystoscopy (N/A, 02/24/2014).  Family History Pateint's family history includes Cancer in an other family member; Diabetes in her mother and another family member; Hearing loss in her son; Heart disease in her mother; High Cholesterol in her father; Hypertension in her father, mother, sister, sister, and another family member; Kidney disease in her sister; Stroke in her sister.  Social History Patient  reports that she has never  smoked. She has never used smokeless tobacco. She reports that she does not drink alcohol or use drugs.    Objective: Vitals:   02/15/19 1414  BP: 118/86  Pulse: 86  Temp: 98.6 F (37 C)  TempSrc:  Skin  SpO2: 98%  Weight: 196 lb 9.6 oz (89.2 kg)  Height: 5\' 4"  (1.626 m)    Body mass index is 33.75 kg/m.  Physical Exam Vitals signs reviewed.  Constitutional:      Appearance: Normal appearance. She is well-developed.  HENT:     Head: Normocephalic and atraumatic.     Right Ear: Tympanic membrane, ear canal and external ear normal.     Left Ear: Tympanic membrane, ear canal and external ear normal.     Nose: Nose normal.     Mouth/Throat:     Mouth: Mucous membranes are moist.  Eyes:     Extraocular Movements: Extraocular movements intact.     Conjunctiva/sclera: Conjunctivae normal.     Pupils: Pupils are equal, round, and reactive to light.  Neck:     Musculoskeletal: Normal range of motion and neck supple.     Thyroid: No thyromegaly.  Cardiovascular:     Rate and Rhythm: Normal rate and regular rhythm.     Pulses: Normal pulses.     Heart sounds: Normal heart sounds. No murmur.  Pulmonary:     Effort: Pulmonary effort is normal.     Breath sounds: Normal breath sounds.  Abdominal:     General: Abdomen is flat. Bowel sounds are normal. There is no distension.     Palpations: Abdomen is soft.     Tenderness: There is no abdominal tenderness.  Lymphadenopathy:     Cervical: Cervical adenopathy (small node on right anterior chain. mildly tender to touch, not pathologically enlarged) present.  Skin:    General: Skin is warm and dry.     Capillary Refill: Capillary refill takes less than 2 seconds.     Findings: No rash.     Comments: Left axillary: mobile, hard mass in center of axillae, about 2cm in height and 1 cm in diameter. Non tender to palpation. No erythema or warmth.   Neurological:     General: No focal deficit present.     Mental Status: She is alert and oriented to person, place, and time.     Cranial Nerves: No cranial nerve deficit.     Coordination: Coordination normal.     Deep Tendon Reflexes: Reflexes normal.  Psychiatric:        Behavior:  Behavior normal.      Diabetic Foot Exam - Simple   Simple Foot Form Diabetic Foot exam was performed with the following findings: Yes 02/15/2019  2:52 PM  Visual Inspection No deformities, no ulcerations, no other skin breakdown bilaterally: Yes Sensation Testing Intact to touch and monofilament testing bilaterally: Yes Pulse Check Posterior Tibialis and Dorsalis pulse intact bilaterally: Yes Comments   normal foot exam bilaterally     Depression screen [facility] 2/9 02/15/2019 02/15/2019 08/30/2018 11/10/2017  Decreased Interest 0 0 0 0  Down, Depressed, Hopeless 0 0 0 0  PHQ - 2 Score 0 0 0 0  Altered sleeping 0 - 3 -  Tired, decreased energy 0 - 2 -  Change in appetite 0 - 3 -  Feeling bad or failure about yourself  0 - 0 -  Trouble concentrating 0 - 2 -  Moving slowly or fidgety/restless 0 - 0 -  Suicidal thoughts 0 -  0 -  PHQ-9 Score 0 - 10 -  Difficult doing work/chores Not difficult at all - Not difficult at all -   GAD 7 : Generalized Anxiety Score 02/15/2019 08/30/2018  Nervous, Anxious, on Edge 0 2  Control/stop worrying 0 3  Worry too much - different things 0 3  Trouble relaxing 0 2  Restless 0 3  Easily annoyed or irritable 0 0  Afraid - awful might happen 0 3  Total GAD 7 Score 0 16  Anxiety Difficulty Not difficult at all Not difficult at all       Assessment/plan: 1. Annual physical exam Routine lab work today. Flu shot. Needs mmg and information given on this, otherwise utd on her HM. Is walking now and has lost 20 pounds. Continue exercise/diet. F/u in 1 year or as needed.  Patient counseling [x]    Nutrition: Stressed importance of moderation in sodium/caffeine intake, saturated fat and cholesterol, caloric balance, sufficient intake of fresh fruits, vegetables, fiber, calcium, iron, and 1 mg of folate supplement per day (for females capable of pregnancy).  [x]    Stressed the importance of regular exercise.   []    Substance Abuse: Discussed cessation/primary  prevention of tobacco, alcohol, or other drug use; driving or other dangerous activities under the influence; availability of treatment for abuse.   [x]    Injury prevention: Discussed safety belts, safety helmets, smoke detector, smoking near bedding or upholstery.   [x]    Sexuality: Discussed sexually transmitted diseases, partner selection, use of condoms, avoidance of unintended pregnancy  and contraceptive alternatives.  [x]    Dental health: Discussed importance of regular tooth brushing, flossing, and dental visits.  [x]    Health maintenance and immunizations reviewed. Please refer to Health maintenance section.    - TSH  2. Controlled type 2 diabetes mellitus without complication, without long-term current use of insulin (HCC) Is doing so good on ozempic and has lost 20+ pounds. a1c near to goal on last check at 6.7. routine lab work today, flu shot today. Foot exam done and normal. Referral to eye doctor for yearly eye exam. On statin and has had her pneumonia shot. F/u in 3-6 months depending on labs. Continue current medication.  - Hemoglobin A1c - CBC with Differential/Platelet - Comprehensive metabolic panel - Microalbumin / creatinine urine ratio - Ambulatory referral to Ophthalmology  3. GAD (generalized anxiety disorder) phq9 and gad7 scores are extremely well controlled with significant improvement. Continue prozac daily. Continue exercise daily F/u in 6 months.   4. Mixed hyperlipidemia Continue crestor. Lipid panel today - Lipid panel  5. Vitamin D deficiency  - VITAMIN D 25 Hydroxy (Vit-D Deficiency, Fractures)  6. Sebaceous cyst of left axilla  - Ambulatory referral to General Surgery  -flu shot today  Return in about 6 months (around 08/15/2019) for diabetes/anxiety .   Orland MustardAllison Demetria Iwai, MD  [facility]  02/15/2019

## 2019-02-15 NOTE — Patient Instructions (Signed)
Referral to surgery to remove sebaceous cyst  Call and set your mammogram!   You need flu shot, especially with covid.   Routine lab work today. See you back in 3-6 months depending on how good your diabetes is.   im so proud of you!  Dr. Rogers Blocker    Preventive Care 11-45 Years Old, Female Preventive care refers to visits with your health care provider and lifestyle choices that can promote health and wellness. This includes:  A yearly physical exam. This may also be called an annual well check.  Regular dental visits and eye exams.  Immunizations.  Screening for certain conditions.  Healthy lifestyle choices, such as eating a healthy diet, getting regular exercise, not using drugs or products that contain nicotine and tobacco, and limiting alcohol use. What can I expect for my preventive care visit? Physical exam Your health care provider will check your:  Height and weight. This may be used to calculate body mass index (BMI), which tells if you are at a healthy weight.  Heart rate and blood pressure.  Skin for abnormal spots. Counseling Your health care provider may ask you questions about your:  Alcohol, tobacco, and drug use.  Emotional well-being.  Home and relationship well-being.  Sexual activity.  Eating habits.  Work and work Statistician.  Method of birth control.  Menstrual cycle.  Pregnancy history. What immunizations do I need?  Influenza (flu) vaccine  This is recommended every year. Tetanus, diphtheria, and pertussis (Tdap) vaccine  You may need a Td booster every 10 years. Varicella (chickenpox) vaccine  You may need this if you have not been vaccinated. Zoster (shingles) vaccine  You may need this after age 69. Measles, mumps, and rubella (MMR) vaccine  You may need at least one dose of MMR if you were born in 1957 or later. You may also need a second dose. Pneumococcal conjugate (PCV13) vaccine  You may need this if you have  certain conditions and were not previously vaccinated. Pneumococcal polysaccharide (PPSV23) vaccine  You may need one or two doses if you smoke cigarettes or if you have certain conditions. Meningococcal conjugate (MenACWY) vaccine  You may need this if you have certain conditions. Hepatitis A vaccine  You may need this if you have certain conditions or if you travel or work in places where you may be exposed to hepatitis A. Hepatitis B vaccine  You may need this if you have certain conditions or if you travel or work in places where you may be exposed to hepatitis B. Haemophilus influenzae type b (Hib) vaccine  You may need this if you have certain conditions. Human papillomavirus (HPV) vaccine  If recommended by your health care provider, you may need three doses over 6 months. You may receive vaccines as individual doses or as more than one vaccine together in one shot (combination vaccines). Talk with your health care provider about the risks and benefits of combination vaccines. What tests do I need? Blood tests  Lipid and cholesterol levels. These may be checked every 5 years, or more frequently if you are over 16 years old.  Hepatitis C test.  Hepatitis B test. Screening  Lung cancer screening. You may have this screening every year starting at age 76 if you have a 30-pack-year history of smoking and currently smoke or have quit within the past 15 years.  Colorectal cancer screening. All adults should have this screening starting at age 53 and continuing until age 76. Your health care provider  may recommend screening at age 75 if you are at increased risk. You will have tests every 1-10 years, depending on your results and the type of screening test.  Diabetes screening. This is done by checking your blood sugar (glucose) after you have not eaten for a while (fasting). You may have this done every 1-3 years.  Mammogram. This may be done every 1-2 years. Talk with your  health care provider about when you should start having regular mammograms. This may depend on whether you have a family history of breast cancer.  BRCA-related cancer screening. This may be done if you have a family history of breast, ovarian, tubal, or peritoneal cancers.  Pelvic exam and Pap test. This may be done every 3 years starting at age 56. Starting at age 31, this may be done every 5 years if you have a Pap test in combination with an HPV test. Other tests  Sexually transmitted disease (STD) testing.  Bone density scan. This is done to screen for osteoporosis. You may have this scan if you are at high risk for osteoporosis. Follow these instructions at home: Eating and drinking  Eat a diet that includes fresh fruits and vegetables, whole grains, lean protein, and low-fat dairy.  Take vitamin and mineral supplements as recommended by your health care provider.  Do not drink alcohol if: ? Your health care provider tells you not to drink. ? You are pregnant, may be pregnant, or are planning to become pregnant.  If you drink alcohol: ? Limit how much you have to 0-1 drink a day. ? Be aware of how much alcohol is in your drink. In the [place] one drink equals one 12 oz bottle of beer (355 mL), one 5 oz glass of wine (148 mL), or one 1 oz glass of hard liquor (44 mL). Lifestyle  Take daily care of your teeth and gums.  Stay active. Exercise for at least 30 minutes on 5 or more days each week.  Do not use any products that contain nicotine or tobacco, such as cigarettes, e-cigarettes, and chewing tobacco. If you need help quitting, ask your health care provider.  If you are sexually active, practice safe sex. Use a condom or other form of birth control (contraception) in order to prevent pregnancy and STIs (sexually transmitted infections).  If told by your health care provider, take low-dose aspirin daily starting at age 38. What's next?  Visit your health care provider once  a year for a well check visit.  Ask your health care provider how often you should have your eyes and teeth checked.  Stay up to date on all vaccines. This information is not intended to replace advice given to you by your health care provider. Make sure you discuss any questions you have with your health care provider. Document Released: 06/05/2015 Document Revised: 01/18/2018 Document Reviewed: 01/18/2018 Elsevier Patient Education  2020 [employer].

## 2019-02-16 LAB — CBC WITH DIFFERENTIAL/PLATELET
Absolute Monocytes: 581 cells/uL (ref 200–950)
Basophils Absolute: 53 cells/uL (ref 0–200)
Basophils Relative: 0.6 %
Eosinophils Absolute: 334 cells/uL (ref 15–500)
Eosinophils Relative: 3.8 %
HCT: 41.6 % (ref 35.0–45.0)
Hemoglobin: 13.8 g/dL (ref 11.7–15.5)
Lymphs Abs: 4145 cells/uL — ABNORMAL HIGH (ref 850–3900)
MCH: 27.7 pg (ref 27.0–33.0)
MCHC: 33.2 g/dL (ref 32.0–36.0)
MCV: 83.4 fL (ref 80.0–100.0)
MPV: 9.4 fL (ref 7.5–12.5)
Monocytes Relative: 6.6 %
Neutro Abs: 3687 cells/uL (ref 1500–7800)
Neutrophils Relative %: 41.9 %
Platelets: 417 10*3/uL — ABNORMAL HIGH (ref 140–400)
RBC: 4.99 10*6/uL (ref 3.80–5.10)
RDW: 13.4 % (ref 11.0–15.0)
Total Lymphocyte: 47.1 %
WBC: 8.8 10*3/uL (ref 3.8–10.8)

## 2019-02-16 LAB — COMPREHENSIVE METABOLIC PANEL
AG Ratio: 1.3 (calc) (ref 1.0–2.5)
ALT: 12 U/L (ref 6–29)
AST: 11 U/L (ref 10–35)
Albumin: 4.3 g/dL (ref 3.6–5.1)
Alkaline phosphatase (APISO): 94 U/L (ref 31–125)
BUN: 8 mg/dL (ref 7–25)
CO2: 26 mmol/L (ref 20–32)
Calcium: 9.9 mg/dL (ref 8.6–10.2)
Chloride: 101 mmol/L (ref 98–110)
Creat: 0.76 mg/dL (ref 0.50–1.10)
Globulin: 3.3 g/dL (calc) (ref 1.9–3.7)
Glucose, Bld: 93 mg/dL (ref 65–99)
Potassium: 4.6 mmol/L (ref 3.5–5.3)
Sodium: 139 mmol/L (ref 135–146)
Total Bilirubin: 0.3 mg/dL (ref 0.2–1.2)
Total Protein: 7.6 g/dL (ref 6.1–8.1)

## 2019-02-16 LAB — LIPID PANEL
Cholesterol: 192 mg/dL (ref <200)
HDL: 41 mg/dL — ABNORMAL LOW (ref >=50)
LDL Cholesterol (Calc): 119 mg/dL (calc) — ABNORMAL HIGH
Non-HDL Cholesterol (Calc): 151 mg/dL (calc) — ABNORMAL HIGH (ref <130)
Total CHOL/HDL Ratio: 4.7 (calc) (ref <5.0)
Triglycerides: 203 mg/dL — ABNORMAL HIGH (ref <150)

## 2019-02-16 LAB — TSH: TSH: 1.06 mIU/L

## 2019-02-16 LAB — HEMOGLOBIN A1C
Hgb A1c MFr Bld: 5.9 % of total Hgb — ABNORMAL HIGH (ref <5.7)
Mean Plasma Glucose: 123 (calc)
eAG (mmol/L): 6.8 (calc)

## 2019-02-16 LAB — MICROALBUMIN / CREATININE URINE RATIO
Creatinine, Urine: 51 mg/dL (ref 20–275)
Microalb Creat Ratio: 10 mcg/mg creat (ref <30)
Microalb, Ur: 0.5 mg/dL

## 2019-02-16 LAB — VITAMIN D 25 HYDROXY (VIT D DEFICIENCY, FRACTURES): Vit D, 25-Hydroxy: 19 ng/mL — ABNORMAL LOW (ref 30–100)

## 2019-02-16 LAB — EXTRA URINE SPECIMEN

## 2019-02-18 ENCOUNTER — Other Ambulatory Visit: Payer: Self-pay | Admitting: Family Medicine

## 2019-02-18 MED ORDER — VITAMIN D (ERGOCALCIFEROL) 1.25 MG (50000 UNIT) PO CAPS: ORAL_CAPSULE | ORAL | 0 refills | Status: DC

## 2019-02-19 ENCOUNTER — Encounter: Payer: Self-pay | Admitting: Family Medicine

## 2019-02-19 NOTE — Addendum Note (Signed)
Addended by: Kevan Ny on: 02/19/2019 11:45 AM   Modules accepted: Orders

## 2019-02-28 ENCOUNTER — Encounter: Payer: Self-pay | Admitting: Family Medicine

## 2019-03-01 ENCOUNTER — Encounter: Payer: Self-pay | Admitting: Family Medicine

## 2019-03-06 ENCOUNTER — Encounter: Payer: Self-pay | Admitting: Family Medicine

## 2019-04-09 ENCOUNTER — Other Ambulatory Visit: Payer: Self-pay | Admitting: Family Medicine

## 2019-04-10 ENCOUNTER — Encounter: Payer: Self-pay | Admitting: Family Medicine

## 2019-04-23 ENCOUNTER — Other Ambulatory Visit: Payer: Self-pay | Admitting: Family Medicine

## 2019-04-24 ENCOUNTER — Ambulatory Visit (INDEPENDENT_AMBULATORY_CARE_PROVIDER_SITE_OTHER): Payer: Managed Care, Other (non HMO) | Admitting: Family Medicine

## 2019-04-24 ENCOUNTER — Encounter: Payer: Self-pay | Admitting: Family Medicine

## 2019-04-24 ENCOUNTER — Other Ambulatory Visit: Payer: Self-pay

## 2019-04-24 VITALS — BP 138/90 | HR 87 | Temp 98.3°F | Ht 64.0 in | Wt 203.0 lb

## 2019-04-24 DIAGNOSIS — R6889 Other general symptoms and signs: Secondary | ICD-10-CM | POA: Diagnosis not present

## 2019-04-24 DIAGNOSIS — R209 Unspecified disturbances of skin sensation: Secondary | ICD-10-CM

## 2019-04-24 NOTE — Progress Notes (Signed)
Patient: Renee Knapp MRN: [id number] DOB: [birth year] PCP: Orma Flaming, MD     Subjective:  Chief Complaint  Patient presents with  . hands/feet cold    x 3 wks    HPI: The patient is a 43 y.o. female who presents today for cold hands and feet x 3 weeks. She states she only gets cold in her hands and feet. She states she has to stay bundled up at home. She had to wear four pairs of socks last night and it feels like she stuck her hands in the freezer. She denies any color change. Temperature in her apartment was at 74 degrees. Her hands and feet are not cold right now. She only notices it at home, but she doesn't leave her house. It will be better if she bundles up she does feel better. She is not really exercising. She states it's not painful, but will tingle once it starts to get warm. She denies any drinking. No family hx of cold intolerance or autoimmune issues. Denies any weakness, loss of sensation.   Review of Systems  Constitutional: Positive for fatigue.  Eyes: Negative for visual disturbance.  Respiratory: Negative for shortness of breath.   Cardiovascular: Negative for chest pain, palpitations and leg swelling.  Gastrointestinal: Positive for nausea. Negative for abdominal pain, diarrhea and vomiting.  Neurological: Negative for dizziness and headaches.  Psychiatric/Behavioral: Positive for sleep disturbance.    Allergies Patient is allergic to bee venom.  Past Medical History Patient  has a past medical history of Anemia, Diabetes mellitus without complication ([place]), Obesity, Plantar fasciitis, bilateral, and SVD (spontaneous vaginal delivery).  Surgical History Patient  has a past surgical history that includes Tubal ligation; Dilation and curettage of uterus; Wisdom tooth extraction; Laparoscopic assisted vaginal hysterectomy (N/A, 02/24/2014); and Cystoscopy (N/A, 02/24/2014).  Family History Pateint's family history includes Cancer in an other family member;  Diabetes in her mother and another family member; Hearing loss in her son; Heart disease in her mother; High Cholesterol in her father; Hypertension in her father, mother, sister, sister, and another family member; Kidney disease in her sister; Stroke in her sister.  Social History Patient  reports that she has never smoked. She has never used smokeless tobacco. She reports that she does not drink alcohol or use drugs.    Objective: Vitals:   04/24/19 1457  BP: 138/90  Pulse: 87  Temp: 98.3 F (36.8 C)  TempSrc: Skin  SpO2: 97%  Weight: 203 lb (92.1 kg)  Height: 5\' 4"  (1.626 m)    Body mass index is 34.84 kg/m.  Physical Exam Vitals signs reviewed.  Constitutional:      Appearance: Normal appearance. She is obese.  HENT:     Head: Normocephalic and atraumatic.  Cardiovascular:     Rate and Rhythm: Normal rate and regular rhythm.     Pulses: Normal pulses.     Heart sounds: Normal heart sounds.     Comments: Pedal pulses are intact bilaterally.  Pulmonary:     Effort: Pulmonary effort is normal.     Breath sounds: Normal breath sounds.  Abdominal:     General: Abdomen is flat. Bowel sounds are normal.     Palpations: Abdomen is soft.  Skin:    General: Skin is warm and dry.     Comments: Toes are cold, but no color changes.   Neurological:     General: No focal deficit present.     Mental Status: She is alert and oriented  to person, place, and time.     Cranial Nerves: No cranial nerve deficit.     Sensory: No sensory deficit.     Motor: No weakness.     Coordination: Coordination normal.     Gait: Gait normal.     Deep Tendon Reflexes: Reflexes normal.     Comments: Sensation intact in all extremities. Grip strength in hands intact.         Depression screen [facility] 2/9 04/24/2019 02/15/2019 02/15/2019 08/30/2018 11/10/2017  Decreased Interest 0 0 0 0 0  Down, Depressed, Hopeless 0 0 0 0 0  PHQ - 2 Score 0 0 0 0 0  Altered sleeping - 0 - 3 -  Tired, decreased energy  - 0 - 2 -  Change in appetite - 0 - 3 -  Feeling bad or failure about yourself  - 0 - 0 -  Trouble concentrating - 0 - 2 -  Moving slowly or fidgety/restless - 0 - 0 -  Suicidal thoughts - 0 - 0 -  PHQ-9 Score - 0 - 10 -  Difficult doing work/chores - Not difficult at all - Not difficult at all -   GAD 7 : Generalized Anxiety Score 04/24/2019 02/15/2019 08/30/2018  Nervous, Anxious, on Edge 3 0 2  Control/stop worrying 1 0 3  Worry too much - different things 3 0 3  Trouble relaxing 1 0 2  Restless 3 0 3  Easily annoyed or irritable 0 0 0  Afraid - awful might happen 3 0 3  Total GAD 7 Score 14 0 16  Anxiety Difficulty Not difficult at all Not difficult at all Not difficult at all     Assessment/plan: 1. Cold extremities peripheral neuropathy vs. raynauds vs. Cold intolerance. Diabetes just recently well controlled. Will check labs and then go from there. Referral for neuro vs. Starting gabapentin. In the mean time keep house warm, wear gloves/socks and make sure exercising and getting up and moving.  - ANA w/reflex - CBC with Differential/Platelet - Comprehensive metabolic panel - Vitamin B12   Return if symptoms worsen or fail to improve.  This visit occurred during the SARS-CoV-2 public health emergency.  Safety protocols were in place, including screening questions prior to the visit, additional usage of staff PPE, and extensive cleaning of exam room while observing appropriate contact time as indicated for disinfecting solutions.     [name], MD [facility]   04/24/2019

## 2019-04-24 NOTE — Patient Instructions (Signed)
Checking labs but thoughts are peripheral neuropathy from your diabetes vs. Raynaud's disease.  Once I have labs back i'll call you with plan.

## 2019-04-25 LAB — COMPREHENSIVE METABOLIC PANEL
ALT: 13 U/L (ref 0–35)
AST: 13 U/L (ref 0–37)
Albumin: 4.1 g/dL (ref 3.5–5.2)
Alkaline Phosphatase: 76 U/L (ref 39–117)
BUN: 10 mg/dL (ref 6–23)
CO2: 28 mEq/L (ref 19–32)
Calcium: 9.4 mg/dL (ref 8.4–10.5)
Chloride: 101 mEq/L (ref 96–112)
Creatinine, Ser: 0.82 mg/dL (ref 0.40–1.20)
GFR: 91.07 mL/min (ref >60.00)
Glucose, Bld: 93 mg/dL (ref 70–99)
Potassium: 4.2 mEq/L (ref 3.5–5.1)
Sodium: 139 mEq/L (ref 135–145)
Total Bilirubin: 0.3 mg/dL (ref 0.2–1.2)
Total Protein: 7.5 g/dL (ref 6.0–8.3)

## 2019-04-25 LAB — ANA: Anti Nuclear Antibody (ANA): NEGATIVE

## 2019-04-25 LAB — CBC WITH DIFFERENTIAL/PLATELET
Basophils Absolute: 0.1 10*3/uL (ref 0.0–0.1)
Basophils Relative: 0.7 % (ref 0.0–3.0)
Eosinophils Absolute: 0.4 10*3/uL (ref 0.0–0.7)
Eosinophils Relative: 4 % (ref 0.0–5.0)
HCT: 38.6 % (ref 36.0–46.0)
Hemoglobin: 12.8 g/dL (ref 12.0–15.0)
Lymphocytes Relative: 43.2 % (ref 12.0–46.0)
Lymphs Abs: 4.1 10*3/uL — ABNORMAL HIGH (ref 0.7–4.0)
MCHC: 33.2 g/dL (ref 30.0–36.0)
MCV: 82.6 fl (ref 78.0–100.0)
Monocytes Absolute: 0.7 10*3/uL (ref 0.1–1.0)
Monocytes Relative: 7.2 % (ref 3.0–12.0)
Neutro Abs: 4.3 10*3/uL (ref 1.4–7.7)
Neutrophils Relative %: 44.9 % (ref 43.0–77.0)
Platelets: 397 10*3/uL (ref 150.0–400.0)
RBC: 4.68 Mil/uL (ref 3.87–5.11)
RDW: 13.3 % (ref 11.5–15.5)
WBC: 9.5 10*3/uL (ref 4.0–10.5)

## 2019-04-25 LAB — VITAMIN B12: Vitamin B-12: 380 pg/mL (ref 211–911)

## 2019-04-26 ENCOUNTER — Encounter: Payer: Self-pay | Admitting: Family Medicine

## 2019-04-29 ENCOUNTER — Other Ambulatory Visit: Payer: Self-pay | Admitting: Family Medicine

## 2019-04-29 DIAGNOSIS — R6889 Other general symptoms and signs: Secondary | ICD-10-CM

## 2019-04-29 DIAGNOSIS — R209 Unspecified disturbances of skin sensation: Secondary | ICD-10-CM

## 2019-05-08 ENCOUNTER — Other Ambulatory Visit: Payer: Self-pay | Admitting: Family Medicine

## 2019-06-07 DIAGNOSIS — Z01818 Encounter for other preprocedural examination: Secondary | ICD-10-CM | POA: Diagnosis not present

## 2019-06-12 ENCOUNTER — Other Ambulatory Visit: Payer: Self-pay | Admitting: General Surgery

## 2019-06-12 DIAGNOSIS — L720 Epidermal cyst: Secondary | ICD-10-CM | POA: Diagnosis not present

## 2019-07-16 ENCOUNTER — Other Ambulatory Visit: Payer: Self-pay

## 2019-07-16 ENCOUNTER — Encounter: Payer: Self-pay | Admitting: Physician Assistant

## 2019-07-16 ENCOUNTER — Ambulatory Visit (INDEPENDENT_AMBULATORY_CARE_PROVIDER_SITE_OTHER): Payer: Managed Care, Other (non HMO) | Admitting: Physician Assistant

## 2019-07-16 ENCOUNTER — Encounter: Payer: Self-pay | Admitting: Family Medicine

## 2019-07-16 VITALS — Temp 97.6°F | Ht 64.0 in | Wt 201.0 lb

## 2019-07-16 DIAGNOSIS — J029 Acute pharyngitis, unspecified: Secondary | ICD-10-CM

## 2019-07-16 MED ORDER — AMOXICILLIN 500 MG PO CAPS: 500.0000 mg | ORAL_CAPSULE | Freq: Two times a day (BID) | ORAL | 0 refills | Status: AC

## 2019-07-16 NOTE — Progress Notes (Signed)
Virtual Visit via Video   I connected with Renee Knapp on 07/16/19 at  3:40 PM EST by a video enabled telemedicine application and verified that I am speaking with the correct person using two identifiers. Location patient: Home Location provider: [facility], Office Persons participating in the virtual visit: Renee Knapp, [place] PA-C, Renee Pickler, LPN   I discussed the limitations of evaluation and management by telemedicine and the availability of in person appointments. The patient expressed understanding and agreed to proceed.  I acted as a Education administrator for [employer], PA-C [employer], LPN  Subjective:   HPI:   Patient is requesting evaluation for possible COVID-19.  Symptom onset: Monday 02/22 at work.  Travel/contacts: No travel, was around a friend who was positive almost two weeks ago. Son's girlfriend, last saw her was 02/13, positive test on 02/19.  Patient endorses the following symptoms: ear pain and scratchy throat  Patient denies the following symptoms: subjective fever, sinus headache, sinus congestion, sinus pain, rhinorrhea, itchy watery eyes, wheezing, shortness of breath, chest tightness, chest pain and myalgia  Treatments tried: none  Patient risk factors: Current ZOXWR-60 risk of complications score: 2 Smoking status: Renee Knapp  reports that she has never smoked. She has never used smokeless tobacco. If female, currently pregnant? []   Yes [x]   No  ROS: See pertinent positives and negatives per HPI.  Patient Active Problem List   Diagnosis Date Noted  . GAD (generalized anxiety disorder) 08/30/2018  . Vitamin D deficiency 01/18/2018  . Hyperlipidemia 11/25/2017  . Controlled type 2 diabetes mellitus without complication, without long-term current use of insulin ([place]) 11/10/2017  . Acute pericarditis 09/01/2015  . S/P laparoscopic assisted vaginal hysterectomy (LAVH) 02/24/2014    Social History   Tobacco Use  .  Smoking status: Never Smoker  . Smokeless tobacco: Never Used  Substance Use Topics  . Alcohol use: No    Current Outpatient Medications:  .  Bayer Microlet Lancets lancets, USE 1 SINGLE USE LANCETS AS NEEDED TO CHECK BLOOD SUGAR, Disp: 100 each, Rfl: 1 .  EPINEPHrine (EPIPEN JR) 0.15 MG/0.3ML injection, Inject 0.6 mLs (0.3 mg total) into the muscle once as needed for up to 1 dose for anaphylaxis (for allergic reaction)., Disp: 2 each, Rfl: 1 .  FLUoxetine (PROZAC) 20 MG capsule, TAKE 1 CAPSULE BY MOUTH EVERY DAY, Disp: 90 capsule, Rfl: 1 .  glucose blood (ACCU-CHEK ACTIVE STRIPS) test strip, Use as instructed, Disp: 50 each, Rfl: 12 .  hydrOXYzine (ATARAX/VISTARIL) 25 MG tablet, Take 1 tablet (25 mg total) by mouth 3 (three) times daily as needed., Disp: 60 tablet, Rfl: 1 .  metFORMIN (GLUCOPHAGE-XR) 500 MG 24 hr tablet, Take 2 tablets (1,000 mg total) by mouth 2 (two) times daily., Disp: 360 tablet, Rfl: 1 .  OZEMPIC, 0.25 OR 0.5 MG/DOSE, 2 MG/1.5ML SOPN, INJECT 0.5 MG INTO THE SKIN ONCE A WEEK., Disp: 4 pen, Rfl: 1 .  rosuvastatin (CRESTOR) 10 MG tablet, TAKE 1 TABLET BY MOUTH EVERY DAY FOR CHOLESTEROL, Disp: 90 tablet, Rfl: 0 .  amoxicillin (AMOXIL) 500 MG capsule, Take 1 capsule (500 mg total) by mouth 2 (two) times daily for 10 days., Disp: 20 capsule, Rfl: 0  Allergies  Allergen Reactions  . Bee Venom Anaphylaxis    Objective:   VITALS: Per patient if applicable, see vitals. GENERAL: Alert, appears well and in no acute distress. HEENT: Atraumatic, conjunctiva clear, no obvious abnormalities on inspection of external nose and ears. NECK:  Normal movements of the head and neck. CARDIOPULMONARY: No increased WOB. Speaking in clear sentences. I:E ratio WNL.  MS: Moves all visible extremities without noticeable abnormality. PSYCH: Pleasant and cooperative, well-groomed. Speech normal rate and rhythm. Affect is appropriate. Insight and judgement are appropriate. Attention is focused,  linear, and appropriate.  NEURO: CN grossly intact. Oriented as arrived to appointment on time with no prompting. Moves both UE equally.  SKIN: No obvious lesions, wounds, erythema, or cyanosis noted on face or hands.  Assessment and Plan:   Renee Knapp was seen today for covid symptoms.  Diagnoses and all orders for this visit:  Sore throat -     Novel Coronavirus, NAA ([facility])  Other orders -     amoxicillin (AMOXIL) 500 MG capsule; Take 1 capsule (500 mg total) by mouth 2 (two) times daily for 10 days.    Patient has a respiratory illness without signs of acute distress or respiratory compromise at this time. Due to worsening symptoms, sore throat and ear pain, Renee empirically treat for strep and AOM with oral amoxicillin. Due to having direct covid-19 exposure, we are also going to send her for testing. She is agreeable to this.  As a precaution, they have been advised to remain home until COVID-19 results and then possible further quarantine after that based on results and symptoms. Advised if they experience a "second sickening" or worsening symptoms as the illness progresses, they are to call the office for further instructions or seek emergent evaluation for any severe symptoms.   . Reviewed expectations re: course of current medical issues. . Discussed self-management of symptoms. . Outlined signs and symptoms indicating need for more acute intervention. . Patient verbalized understanding and all questions were answered. Marland Kitchen Health Maintenance issues including appropriate healthy diet, exercise, and smoking avoidance were discussed with patient. . See orders for this visit as documented in the electronic medical record.  I discussed the assessment and treatment plan with the patient. The patient was provided an opportunity to ask questions and all were answered. The patient agreed with the plan and demonstrated an understanding of the instructions.   The patient was advised to  call back or seek an in-person evaluation if the symptoms worsen or if the condition fails to improve as anticipated.   CMA or LPN served as scribe during this visit. History, Physical, and Plan performed by medical provider. The above documentation has been reviewed and is accurate and complete.   Francis, [place] 07/16/2019

## 2019-07-17 ENCOUNTER — Ambulatory Visit: Payer: BC Managed Care – PPO | Attending: Internal Medicine

## 2019-07-17 DIAGNOSIS — Z20822 Contact with and (suspected) exposure to covid-19: Secondary | ICD-10-CM

## 2019-07-17 NOTE — Telephone Encounter (Signed)
Please Advise

## 2019-07-18 ENCOUNTER — Telehealth: Payer: Self-pay

## 2019-07-18 LAB — NOVEL CORONAVIRUS, NAA: SARS-CoV-2, NAA: NOT DETECTED

## 2019-07-18 NOTE — Telephone Encounter (Signed)
Pt requesting Ozempic rx refill.  Called pt to advise her to call her insurance to see which medications are covered. Pt will call insurance provider.

## 2019-08-12 DIAGNOSIS — Z1231 Encounter for screening mammogram for malignant neoplasm of breast: Secondary | ICD-10-CM | POA: Diagnosis not present

## 2019-08-16 ENCOUNTER — Ambulatory Visit (INDEPENDENT_AMBULATORY_CARE_PROVIDER_SITE_OTHER): Payer: BC Managed Care – PPO | Admitting: Family Medicine

## 2019-08-16 ENCOUNTER — Encounter: Payer: Self-pay | Admitting: Family Medicine

## 2019-08-16 ENCOUNTER — Other Ambulatory Visit: Payer: Self-pay

## 2019-08-16 VITALS — BP 108/72 | HR 87 | Temp 98.9°F | Ht 64.0 in | Wt 209.6 lb

## 2019-08-16 DIAGNOSIS — E559 Vitamin D deficiency, unspecified: Secondary | ICD-10-CM

## 2019-08-16 DIAGNOSIS — E119 Type 2 diabetes mellitus without complications: Secondary | ICD-10-CM | POA: Diagnosis not present

## 2019-08-16 DIAGNOSIS — F411 Generalized anxiety disorder: Secondary | ICD-10-CM

## 2019-08-16 LAB — CBC WITH DIFFERENTIAL/PLATELET
Basophils Absolute: 0.1 10*3/uL (ref 0.0–0.1)
Basophils Relative: 0.7 % (ref 0.0–3.0)
Eosinophils Absolute: 0.3 10*3/uL (ref 0.0–0.7)
Eosinophils Relative: 4.4 % (ref 0.0–5.0)
HCT: 39.5 % (ref 36.0–46.0)
Hemoglobin: 13 g/dL (ref 12.0–15.0)
Lymphocytes Relative: 46.8 % — ABNORMAL HIGH (ref 12.0–46.0)
Lymphs Abs: 3.6 10*3/uL (ref 0.7–4.0)
MCHC: 32.8 g/dL (ref 30.0–36.0)
MCV: 82.1 fl (ref 78.0–100.0)
Monocytes Absolute: 0.5 10*3/uL (ref 0.1–1.0)
Monocytes Relative: 6.8 % (ref 3.0–12.0)
Neutro Abs: 3.2 10*3/uL (ref 1.4–7.7)
Neutrophils Relative %: 41.3 % — ABNORMAL LOW (ref 43.0–77.0)
Platelets: 351 10*3/uL (ref 150.0–400.0)
RBC: 4.81 Mil/uL (ref 3.87–5.11)
RDW: 13.6 % (ref 11.5–15.5)
WBC: 7.7 10*3/uL (ref 4.0–10.5)

## 2019-08-16 LAB — COMPREHENSIVE METABOLIC PANEL
ALT: 13 U/L (ref 0–35)
AST: 12 U/L (ref 0–37)
Albumin: 4.3 g/dL (ref 3.5–5.2)
Alkaline Phosphatase: 86 U/L (ref 39–117)
BUN: 12 mg/dL (ref 6–23)
CO2: 31 mEq/L (ref 19–32)
Calcium: 9.6 mg/dL (ref 8.4–10.5)
Chloride: 101 mEq/L (ref 96–112)
Creatinine, Ser: 0.79 mg/dL (ref 0.40–1.20)
GFR: 94.94 mL/min (ref >60.00)
Glucose, Bld: 128 mg/dL — ABNORMAL HIGH (ref 70–99)
Potassium: 4.4 mEq/L (ref 3.5–5.1)
Sodium: 140 mEq/L (ref 135–145)
Total Bilirubin: 0.4 mg/dL (ref 0.2–1.2)
Total Protein: 7.4 g/dL (ref 6.0–8.3)

## 2019-08-16 LAB — HEMOGLOBIN A1C: Hgb A1c MFr Bld: 7.3 % — ABNORMAL HIGH (ref 4.6–6.5)

## 2019-08-16 LAB — VITAMIN D 25 HYDROXY (VIT D DEFICIENCY, FRACTURES): VITD: 20.15 ng/mL — ABNORMAL LOW (ref 30.00–100.00)

## 2019-08-16 MED ORDER — TRULICITY 0.75 MG/0.5ML ~~LOC~~ SOAJ: 0.7500 mg | SUBCUTANEOUS | 2 refills | Status: DC

## 2019-08-16 MED ORDER — METFORMIN HCL ER 500 MG PO TB24: 1000.0000 mg | ORAL_TABLET | Freq: Two times a day (BID) | ORAL | 1 refills | Status: DC

## 2019-08-16 NOTE — Progress Notes (Signed)
Patient: Renee Knapp MRN: [id number] DOB: [birth year] PCP: [name], MD     Subjective:  Chief Complaint  Patient presents with  . Diabetes  . Weight Gain    Since she has not been on insulin.  . Anxiety    HPI: The patient is a 44 y.o. female who presents today for diabetes and anxiety follow up.   Diabetes: Patient is here for follow up of type 2 diabetes.  Currently on the following medications metformin Xr 1000mg  bid and ozempic. Takes medications as prescribed. Last A1C was 5.7. Currently exercising and following diabetic diet.  Denies any hypoglycemic events. Denies any vision changes, nausea, vomiting, abdominal pain, ulcers/paraesthesia in feet, polyuria, polydipsia or polyphagia. Denies any chest pain, shortness of breath. Has been out of ozempic since 07/18/19. Insurance no longer covers this drug.   Anxiety: She was on prozac 20mg /day. She stopped taking this on a daily basis in January. She now takes it twice as week despite me telling her this is not an as needed drug. She has not been taking hydroxyzine as needed.   Vitamin D: we replaced her last September and she took the weekly, but never took daily replacement.   Review of Systems  Constitutional: Negative for appetite change.  HENT: Negative for sinus pressure, sinus pain and sore throat.   Cardiovascular: Negative for chest pain and palpitations.  Gastrointestinal: Negative for abdominal pain, nausea and vomiting.  Neurological: Negative for dizziness, light-headedness and headaches.    Allergies Patient is allergic to bee venom.  Past Medical History Patient  has a past medical history of Anemia, Diabetes mellitus without complication (HCC), Obesity, Plantar fasciitis, bilateral, and SVD (spontaneous vaginal delivery).  Surgical History Patient  has a past surgical history that includes Tubal ligation; Dilation and curettage of uterus; Wisdom tooth extraction; Laparoscopic assisted vaginal  hysterectomy (N/A, 02/24/2014); and Cystoscopy (N/A, 02/24/2014).  Family History Pateint's family history includes Cancer in an other family member; Diabetes in her mother and another family member; Hearing loss in her son; Heart disease in her mother; High Cholesterol in her father; Hypertension in her father, mother, sister, sister, and another family member; Kidney disease in her sister; Stroke in her sister.  Social History Patient  reports that she has never smoked. She has never used smokeless tobacco. She reports that she does not drink alcohol or use drugs.    Objective: Vitals:   08/16/19 1404  BP: 108/72  Pulse: 87  Temp: 98.9 F (37.2 C)  TempSrc: Temporal  SpO2: 95%  Weight: 209 lb 9.6 oz (95.1 kg)  Height: 5\' 4"  (1.626 m)    Body mass index is 35.98 kg/m.  Physical Exam Vitals reviewed.  Constitutional:      Appearance: Normal appearance. She is obese.  HENT:     Head: Normocephalic and atraumatic.  Cardiovascular:     Rate and Rhythm: Normal rate and regular rhythm.     Heart sounds: Normal heart sounds.  Pulmonary:     Effort: Pulmonary effort is normal.     Breath sounds: Normal breath sounds.  Abdominal:     General: Abdomen is flat. Bowel sounds are normal.     Palpations: Abdomen is soft.  Musculoskeletal:     Cervical back: Normal range of motion and neck supple.  Skin:    Capillary Refill: Capillary refill takes less than 2 seconds.     Comments: Acanthosis nigricans   Neurological:     General: No focal deficit present.  Mental Status: She is alert and oriented to person, place, and time.  Psychiatric:        Mood and Affect: Mood normal.        Behavior: Behavior normal.          Office Visit from 08/16/2019 in [facility]  PHQ-9 Total Score  1     GAD 7 : Generalized Anxiety Score 08/16/2019 04/24/2019 02/15/2019 08/30/2018  Nervous, Anxious, on Edge 0 3 0 2  Control/stop worrying 0 1 0 3  Worry too much -  different things 0 3 0 3  Trouble relaxing 0 1 0 2  Restless 0 3 0 3  Easily annoyed or irritable 0 0 0 0  Afraid - awful might happen 1 3 0 3  Total GAD 7 Score 1 14 0 16  Anxiety Difficulty Not difficult at all Not difficult at all Not difficult at all Not difficult at all     Assessment/plan: 1. Controlled type 2 diabetes mellitus without complication, without long-term current use of insulin ([place]) -out of ozempic x 1 month. Will see how her a1c is doing. Never called insurance to see what other drug they covered. Has been on metformin. trulicity sent in. Will start her on .75mg  dosage with metformin and have her f/u in 3 months time.  - Hemoglobin A1c - CBC with Differential/Platelet - Comprehensive metabolic panel  2. GAD (generalized anxiety disorder) gad7 extremely well controlled off prozac. Discussed this is not an as needed medication. Doing well with stressors removed from her life. Can continue off prozac and take hydroxyzine prn. She is in agreement with this.   3. Vitamin D deficiency  - VITAMIN D 25 Hydroxy (Vit-D Deficiency, Fractures)  covid vaccine information given.   This visit occurred during the SARS-CoV-2 public health emergency.  Safety protocols were in place, including screening questions prior to the visit, additional usage of staff PPE, and extensive cleaning of exam room while observing appropriate contact time as indicated for disinfecting solutions.    Return in about 3 months (around 11/16/2019) for need a1c check .   Orma Flaming, MD [place]   08/16/2019

## 2019-08-16 NOTE — Patient Instructions (Signed)
-  okay, we are going to see if they will cover trulicity. It's just like ozempic, once/week. After 3 months lets check a1c and we can increase dose if we need to do this.   -checking vitamin D again.   -stop prozac. Not an as needed drug. If you need something, take the hydroxyzine as needed for anxiety.   im so proud of you!   See you back in 3 months for diabetes.  Dr. Artis Flock

## 2019-08-17 ENCOUNTER — Encounter: Payer: Self-pay | Admitting: Family Medicine

## 2019-08-19 ENCOUNTER — Other Ambulatory Visit: Payer: Self-pay | Admitting: Family Medicine

## 2019-08-19 ENCOUNTER — Ambulatory Visit: Payer: BC Managed Care – PPO | Attending: Internal Medicine

## 2019-08-19 DIAGNOSIS — Z23 Encounter for immunization: Secondary | ICD-10-CM

## 2019-08-19 MED ORDER — VITAMIN D (ERGOCALCIFEROL) 1.25 MG (50000 UNIT) PO CAPS: ORAL_CAPSULE | ORAL | 0 refills | Status: DC

## 2019-08-19 NOTE — Progress Notes (Signed)
   Covid-19 Vaccination Clinic  Name:  Renee Knapp    MRN: [id number] DOB: 07-04-1975  08/19/2019  [name] was observed post Covid-19 immunization for 30 minutes based on pre-vaccination screening without incident. She was provided with Vaccine Information Sheet and instruction to access the V-Safe system.   [name] was instructed to call 911 with any severe reactions post vaccine: Marland Kitchen Difficulty breathing  . Swelling of face and throat  . A fast heartbeat  . A bad rash all over body  . Dizziness and weakness   Immunizations Administered    Name Date Dose VIS Date Route   Pfizer COVID-19 Vaccine 08/19/2019 12:03 PM 0.3 mL 05/03/2019 Intramuscular   Manufacturer: [employer], [employer]   Lot: [id number]   NDC: [address]-4

## 2019-08-20 ENCOUNTER — Encounter: Payer: Self-pay | Admitting: Family Medicine

## 2019-08-20 LAB — HM DIABETES EYE EXAM

## 2019-09-02 ENCOUNTER — Other Ambulatory Visit: Payer: Self-pay

## 2019-09-02 ENCOUNTER — Emergency Department (HOSPITAL_COMMUNITY)
Admission: EM | Admit: 2019-09-02 | Discharge: 2019-09-02 | Disposition: A | Discharge status: Home / Self Care | Payer: BC Managed Care – PPO | Attending: Emergency Medicine | Admitting: Emergency Medicine

## 2019-09-02 ENCOUNTER — Encounter (HOSPITAL_COMMUNITY): Payer: Self-pay | Admitting: Emergency Medicine

## 2019-09-02 DIAGNOSIS — R202 Paresthesia of skin: Secondary | ICD-10-CM | POA: Insufficient documentation

## 2019-09-02 DIAGNOSIS — R519 Headache, unspecified: Secondary | ICD-10-CM | POA: Insufficient documentation

## 2019-09-02 DIAGNOSIS — Z7729 Contact with and (suspected ) exposure to other hazardous substances: Secondary | ICD-10-CM | POA: Diagnosis not present

## 2019-09-02 DIAGNOSIS — E119 Type 2 diabetes mellitus without complications: Secondary | ICD-10-CM | POA: Insufficient documentation

## 2019-09-02 LAB — I-STAT CHEM 8, ED
BUN: 9 mg/dL (ref 6–20)
Calcium, Ion: 1.12 mmol/L — ABNORMAL LOW (ref 1.15–1.40)
Chloride: 102 mmol/L (ref 98–111)
Creatinine, Ser: 0.7 mg/dL (ref 0.44–1.00)
Glucose, Bld: 108 mg/dL — ABNORMAL HIGH (ref 70–99)
HCT: 43 % (ref 36.0–46.0)
Hemoglobin: 14.6 g/dL (ref 12.0–15.0)
Potassium: 4.3 mmol/L (ref 3.5–5.1)
Sodium: 141 mmol/L (ref 135–145)
TCO2: 30 mmol/L (ref 22–32)

## 2019-09-02 LAB — COOXEMETRY PANEL
Carboxyhemoglobin: 1.3 % (ref 0.5–1.5)
Methemoglobin: 1.1 % (ref 0.0–1.5)
O2 Saturation: 72.7 %
Total hemoglobin: 14 g/dL (ref 12.0–16.0)

## 2019-09-02 MED ORDER — ONDANSETRON HCL 4 MG/2ML IJ SOLN
4.0000 mg | Freq: Once | INTRAMUSCULAR | Status: AC
  Administered 2019-09-02: 4 mg via INTRAVENOUS
  Filled 2019-09-02: qty 2

## 2019-09-02 MED ORDER — ACETAMINOPHEN 500 MG PO TABS
1000.0000 mg | ORAL_TABLET | Freq: Once | ORAL | Status: AC
  Administered 2019-09-02: 1000 mg via ORAL
  Filled 2019-09-02: qty 2

## 2019-09-02 MED ORDER — METOCLOPRAMIDE HCL 5 MG/ML IJ SOLN
10.0000 mg | INTRAMUSCULAR | Status: AC
  Administered 2019-09-02: 10 mg via INTRAVENOUS
  Filled 2019-09-02: qty 2

## 2019-09-02 MED ORDER — SODIUM CHLORIDE 0.9 % IV BOLUS
500.0000 mL | Freq: Once | INTRAVENOUS | Status: AC
  Administered 2019-09-02: 500 mL via INTRAVENOUS

## 2019-09-02 MED ORDER — DIPHENHYDRAMINE HCL 50 MG/ML IJ SOLN
12.5000 mg | Freq: Once | INTRAMUSCULAR | Status: AC
  Administered 2019-09-02: 12.5 mg via INTRAVENOUS
  Filled 2019-09-02: qty 1
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
## 2019-09-02 NOTE — ED Provider Notes (Signed)
[facility] EMERGENCY DEPARTMENT Provider Note   CSN: [id number] Arrival date & time: 09/02/19  0005     History Chief Complaint  Patient presents with  . Headache    Renee Knapp is a 44 y.o. female.   45 y/o female with hx of DM, obesity presents to the emergency department for evaluation of headache.  She reports a constant, frontal and throbbing headache which has been ongoing since secondhand exposure to marijuana smoke.  She states that her neighbor smokes marijuana regularly.  She has been complaining to her landlord, but her apartment often smells of marijuana frequently.  She has never had a headache from marijuana exposure in the past.  Reporting some associated paresthesias in her lips and mouth.  Has not taken any medications for symptoms prior to arrival.  No vision changes, tinnitus, hearing loss, difficulty speaking or swallowing, shortness of breath, lip or tongue swelling, extremity numbness or paresthesias, extremity weakness, fevers.  She is not on chronic anticoagulation.  The history is provided by the patient. No language interpreter was used.  Headache      Past Medical History:  Diagnosis Date  . Anemia    History  . Diabetes mellitus without complication (HCC)    diet and exercise controlled - no meds  . Obesity   . Plantar fasciitis, bilateral    History - recvd cortisone injections bilateral- no current prob as 01/2014  . SVD (spontaneous vaginal delivery)    x 2    Patient Active Problem List   Diagnosis Date Noted  . GAD (generalized anxiety disorder) 08/30/2018  . Vitamin D deficiency 01/18/2018  . Hyperlipidemia 11/25/2017  . Controlled type 2 diabetes mellitus without complication, without long-term current use of insulin (HCC) 11/10/2017  . Acute pericarditis 09/01/2015  . S/P laparoscopic assisted vaginal hysterectomy (LAVH) 02/24/2014    Past Surgical History:  Procedure Laterality Date  . CYSTOSCOPY N/A  02/24/2014   Procedure: CYSTOSCOPY;  Surgeon: Essie Hart, MD;  Location: [facility];  Service: Gynecology;  Laterality: N/A;  . DILATION AND CURETTAGE OF UTERUS     MAB  . LAPAROSCOPIC ASSISTED VAGINAL HYSTERECTOMY N/A 02/24/2014   Procedure: LAPAROSCOPIC ASSISTED VAGINAL HYSTERECTOMY WITH BILATERAL SALPINGECTOMY ;  Surgeon: Essie Hart, MD;  Location: [facility];  Service: Gynecology;  Laterality: N/A;  . TUBAL LIGATION    . WISDOM TOOTH EXTRACTION       OB History    Gravida  3   Para      Term      Preterm      AB  1   Living  2     SAB  1   TAB      Ectopic      Multiple      Live Births              Family History  Problem Relation Age of Onset  . Cancer Other   . Hypertension Other   . Diabetes Other   . Diabetes Mother   . Heart disease Mother   . Hypertension Mother   . Hypertension Father   . High Cholesterol Father   . Hypertension Sister   . Kidney disease Sister   . Stroke Sister   . Hypertension Sister   . Hearing loss Son     Social History   Tobacco Use  . Smoking status: Never Smoker  . Smokeless tobacco: Never Used  Substance Use Topics  . Alcohol use: No  .  Drug use: No    Home Medications Prior to Admission medications   Medication Sig Start Date End Date Taking? Authorizing Haematologist lancets USE 1 SINGLE USE LANCETS AS NEEDED TO CHECK BLOOD SUGAR 04/24/19   Orma Flaming, MD  Dulaglutide (TRULICITY) 2.54 YH/0.6CB SOPN Inject 0.75 mg into the skin once a week. 08/16/19   Orma Flaming, MD  EPINEPHrine (EPIPEN JR) 0.15 MG/0.3ML injection Inject 0.6 mLs (0.3 mg total) into the muscle once as needed for up to 1 dose for anaphylaxis (for allergic reaction). 02/15/19   Orma Flaming, MD  glucose blood (ACCU-CHEK ACTIVE STRIPS) test strip Use as instructed 10/19/18   Orma Flaming, MD  metFORMIN (GLUCOPHAGE-XR) 500 MG 24 hr tablet Take 2 tablets (1,000 mg total) by mouth 2 (two) times daily. 08/16/19   Orma Flaming, MD   OZEMPIC, 0.25 OR 0.5 MG/DOSE, 2 MG/1.5ML SOPN INJECT 0.5 MG INTO THE SKIN ONCE A WEEK. 04/09/19   Orma Flaming, MD  rosuvastatin (CRESTOR) 10 MG tablet TAKE 1 TABLET BY MOUTH EVERY DAY FOR CHOLESTEROL 01/29/19   Orma Flaming, MD  Vitamin D, Ergocalciferol, (DRISDOL) 1.25 MG (50000 UNIT) CAPS capsule One capsule by mouth once a week for 12 weeks. Then take 2000IU/day 08/19/19   Orma Flaming, MD    Allergies    Bee venom and Reglan [metoclopramide]  Review of Systems   Review of Systems  Neurological: Positive for headaches.  Ten systems reviewed and are negative for acute change, except as noted in the HPI.    Physical Exam Updated Vital Signs BP 131/83   Pulse 80   Temp 98.3 F (36.8 C) (Oral)   Resp 16   Ht 5\' 4"  (1.626 m)   Wt 100 kg   LMP 02/06/2014 (LMP Unknown)   SpO2 100%   BMI 37.84 kg/m   Physical Exam Vitals and nursing note reviewed.  Constitutional:      General: She is not in acute distress.    Appearance: She is well-developed. She is not diaphoretic.     Comments: Nontoxic appearing and in NAD  HENT:     Head: Normocephalic and atraumatic.     Right Ear: External ear normal.     Left Ear: External ear normal.     Mouth/Throat:     Mouth: Mucous membranes are moist.     Comments: Symmetric rise of the uvula with phonation Eyes:     General: No scleral icterus.    Extraocular Movements: Extraocular movements intact.     Conjunctiva/sclera: Conjunctivae normal.     Pupils: Pupils are equal, round, and reactive to light.     Comments: No nystagmus  Neck:     Comments: No meningismus  Pulmonary:     Effort: Pulmonary effort is normal. No respiratory distress.     Comments: Respirations even and unlabored Musculoskeletal:        General: Normal range of motion.     Cervical back: Normal range of motion.  Skin:    General: Skin is warm and dry.     Coloration: Skin is not pale.     Findings: No erythema or rash.  Neurological:     General: No  focal deficit present.     Mental Status: She is alert and oriented to person, place, and time.     Coordination: Coordination normal.     Comments: GCS 15. Speech is goal oriented. No cranial nerve deficits appreciated; symmetric eyebrow raise, no facial drooping, tongue midline.  Patient has equal grip strength bilaterally with 5/5 strength against resistance in all major muscle groups bilaterally. Sensation to light touch intact. Patient moves extremities without ataxia. No dysmetria to finger-nose-finger.  No pronator drift.  Psychiatric:        Behavior: Behavior normal.     ED Results / Procedures / Treatments   Labs (all labs ordered are listed, but only abnormal results are displayed) Labs Reviewed  I-STAT CHEM 8, ED - Abnormal; Notable for the following components:      Result Value   Glucose, Bld 108 (*)    Calcium, Ion 1.12 (*)    All other components within normal limits  COOXEMETRY PANEL    EKG None  Radiology No results found.  Procedures Procedures (including critical care time)  Medications Ordered in ED Medications  metoCLOPramide (REGLAN) injection 10 mg (10 mg Intravenous Given 09/02/19 0527)  acetaminophen (TYLENOL) tablet 1,000 mg (1,000 mg Oral Given 09/02/19 0528)  diphenhydrAMINE (BENADRYL) injection 12.5 mg (12.5 mg Intravenous Given 09/02/19 0555)  sodium chloride 0.9 % bolus 500 mL (500 mLs Intravenous New Bag/Given 09/02/19 0605)  ondansetron (ZOFRAN) injection 4 mg (4 mg Intravenous Given 09/02/19 0557)    ED Course  I have reviewed the triage vital signs and the nursing notes.  Pertinent labs & imaging results that were available during my care of the patient were reviewed by me and considered in my medical decision making (see chart for details).  Clinical Course as of Sep 01 633  Mon Sep 02, 2019  6712 Patient with adverse reaction to Reglan; feels anxious and agitated. Benadryl ordered. Does report headache to be resolved.   [KH]  [phone]  Patient feeling better, much more calm, but now drowsy from the IV Benadryl. States headache and tingling have subsided.  Anticipate discharge when Benadryl more metabolized.   [KH]    Clinical Course User Index [KH] Antony Madura, PA-C   MDM Rules/Calculators/A&P                      44 year old female presenting for oral tingling and frontal headache after exposure to marijuana smoke.  Neurologic exam nonfocal.  No signs of anaphylaxis.  Her headache and tingling have improved following Reglan and Tylenol, but patient developed an adverse reaction to the Reglan requiring IV Benadryl.  She is now exceedingly drowsy and unable to drive herself home from the emergency department.  Is pending arrival of family member to drive her home following discharge.  Disposition to be set by [place] Planas, PA-C.   Final Clinical Impression(s) / ED Diagnoses Final diagnoses:  Frontal headache  Exposure to marijuana smoke    Rx / DC Orders ED Discharge Orders    None       Antony Madura, PA-C 09/02/19 9983    Zadie Rhine, MD 09/03/19 [phone]
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
## 2019-09-02 NOTE — ED Notes (Signed)
Following reglan administration pt became tachycardic, began vomiting and became very anxious. Provider made aware and benadryl, zofran and 500 ml bolus ordered. After meds given patient calmed down and went back to sinus rhythm.

## 2019-09-02 NOTE — ED Notes (Signed)
Patient verbalizes understanding of discharge instructions. Opportunity for questioning and answers were provided. Armband removed by staff, pt discharged from ED to home via POV. ambulatory and a&ox4

## 2019-09-10 ENCOUNTER — Ambulatory Visit: Payer: Self-pay

## 2019-09-11 ENCOUNTER — Ambulatory Visit: Payer: BC Managed Care – PPO | Attending: Internal Medicine

## 2019-09-11 DIAGNOSIS — Z23 Encounter for immunization: Secondary | ICD-10-CM

## 2019-09-11 NOTE — Progress Notes (Signed)
   Covid-19 Vaccination Clinic  Name:  IVYANNA SIBERT    MRN: [id number] DOB: 10/25/1975  09/11/2019  Ms. Kuk was observed post Covid-19 immunization for 30 minutes based on pre-vaccination screening without incident. She was provided with Vaccine Information Sheet and instruction to access the V-Safe system.   Ms. Barman was instructed to call 911 with any severe reactions post vaccine: Marland Kitchen Difficulty breathing  . Swelling of face and throat  . A fast heartbeat  . A bad rash all over body  . Dizziness and weakness   Immunizations Administered    Name Date Dose VIS Date Route   Pfizer COVID-19 Vaccine 09/11/2019  1:15 PM 0.3 mL 07/17/2018 Intramuscular   Manufacturer: [employer], [employer]   Lot: [id number]   NDC: [address]-1

## 2019-09-12 ENCOUNTER — Encounter: Payer: Self-pay | Admitting: Family Medicine

## 2019-09-12 ENCOUNTER — Other Ambulatory Visit: Payer: Self-pay | Admitting: Family Medicine

## 2019-09-12 MED ORDER — ROSUVASTATIN CALCIUM 10 MG PO TABS: 10.0000 mg | ORAL_TABLET | Freq: Every day | ORAL | 0 refills | Status: DC

## 2019-09-13 ENCOUNTER — Other Ambulatory Visit: Payer: Self-pay | Admitting: Family Medicine

## 2019-09-13 MED ORDER — OZEMPIC (0.25 OR 0.5 MG/DOSE) 2 MG/1.5ML ~~LOC~~ SOPN: 0.5000 mg | PEN_INJECTOR | SUBCUTANEOUS | 1 refills | Status: DC

## 2019-10-02 ENCOUNTER — Encounter: Payer: Self-pay | Admitting: Family Medicine

## 2019-10-04 ENCOUNTER — Ambulatory Visit (INDEPENDENT_AMBULATORY_CARE_PROVIDER_SITE_OTHER): Payer: BC Managed Care – PPO

## 2019-10-04 ENCOUNTER — Encounter: Payer: Self-pay | Admitting: Family Medicine

## 2019-10-04 ENCOUNTER — Ambulatory Visit (INDEPENDENT_AMBULATORY_CARE_PROVIDER_SITE_OTHER): Payer: BC Managed Care – PPO | Admitting: Family Medicine

## 2019-10-04 ENCOUNTER — Other Ambulatory Visit: Payer: Self-pay

## 2019-10-04 VITALS — BP 120/84 | HR 90 | Temp 98.7°F | Ht 64.0 in | Wt 209.0 lb

## 2019-10-04 DIAGNOSIS — M7061 Trochanteric bursitis, right hip: Secondary | ICD-10-CM | POA: Diagnosis not present

## 2019-10-04 DIAGNOSIS — M25562 Pain in left knee: Secondary | ICD-10-CM

## 2019-10-04 DIAGNOSIS — M1712 Unilateral primary osteoarthritis, left knee: Secondary | ICD-10-CM | POA: Diagnosis not present

## 2019-10-04 MED ORDER — TRAMADOL HCL 50 MG PO TABS: 50.0000 mg | ORAL_TABLET | Freq: Three times a day (TID) | ORAL | 0 refills | Status: AC | PRN

## 2019-10-04 MED ORDER — DICLOFENAC SODIUM 1 % EX GEL: 4.0000 g | Freq: Four times a day (QID) | CUTANEOUS | 1 refills | Status: DC

## 2019-10-04 NOTE — Patient Instructions (Addendum)
1) concern for meniscal injury to the knee. Will need a MRI. I ordered this.   -get over the counter voltaren gel ( I did do prescription). Rub on knee and hip up to four times a day.  - sending in pain medication called tramadol for you as well.   -for your hip, you have bursitis. im sending in 5 days of steroids. Will make sugars a bit high, but should return to normal after you are done with steroids. Let me know if this doesn't get better and we will send to sports medicine.    Hip Bursitis  Hip bursitis is swelling of a fluid-filled sac (bursa) in your hip joint. This swelling (inflammation) can be painful. This condition may come and go over time. What are the causes?  Injury to the hip.  Overuse of the muscles that surround the hip joint.  An earlier injury or surgery of the hip.  Arthritis or gout.  Diabetes.  Thyroid disease.  Infection.  In some cases, the cause may not be known. What are the signs or symptoms?  Mild or moderate pain in the hip area. Pain may get worse with movement.  Tenderness and swelling of the hip, especially on the outer side of the hip.  In rare cases, the bursa may become infected. This may cause: ? A fever. ? Warmth and redness in the area. Symptoms may come and go. How is this treated? This condition is treated by resting, icing, applying pressure (compression), and raising (elevating) the injured area. You may hear this called the RICE treatment. Treatment may also include:  Using crutches.  Draining fluid out of the bursa to help relieve swelling.  Giving a shot of (injecting) medicine that helps to reduce swelling (cortisone).  Other medicines if the bursa is infected. Follow these instructions at home: Managing pain, stiffness, and swelling   If told, put ice on the painful area. ? Put ice in a plastic bag. ? Place a towel between your skin and the bag. ? Leave the ice on for 20 minutes, 2-3 times a day. ? Raise  (elevate) your hip above the level of your heart as much as you can without pain. To do this, try putting a pillow under your hips while you lie down. Stop if this causes pain. Activity  Return to your normal activities as told by your doctor. Ask your doctor what activities are safe for you.  Rest and protect your hip as much as you can until you feel better. General instructions  Take over-the-counter and prescription medicines only as told by your doctor.  Wear wraps that put pressure on your hip (compression wraps) only as told by your doctor.  Do not use your hip to support your body weight until your doctor says that you can.  Use crutches as told by your doctor.  Gently rub and stretch your injured area as often as is comfortable.  Keep all follow-up visits as told by your doctor. This is important. How is this prevented?  Exercise regularly, as told by your doctor.  Warm up and stretch before being active.  Cool down and stretch after being active.  Avoid activities that bother your hip or cause pain.  Avoid sitting down for long periods at a time. Contact a doctor if:  You have a fever.  You get new symptoms.  You have trouble walking.  You have trouble doing everyday activities.  You have pain that gets worse.  You have  pain that does not get better with medicine.  You get red skin on your hip area.  You get a feeling of warmth in your hip area. Get help right away if:  You cannot move your hip.  You have very bad pain. Summary  Hip bursitis is swelling of a fluid-filled sac (bursa) in your hip.  Hip bursitis can be painful.  Symptoms often come and go over time.  This condition is treated with rest, ice, compression, elevation, and medicines. This information is not intended to replace advice given to you by your health care provider. Make sure you discuss any questions you have with your health care provider. Document Revised: 01/15/2018  Document Reviewed: 01/15/2018 Elsevier Patient Education  2020 [employer] Inc[employer]    Hip Bursitis Rehab Ask your health care provider which exercises are safe for you. Do exercises exactly as told by your health care provider and adjust them as directed. It is normal to feel mild stretching, pulling, tightness, or discomfort as you do these exercises. Stop right away if you feel sudden pain or your pain gets worse. Do not begin these exercises until told by your health care provider. Stretching exercise This exercise warms up your muscles and joints and improves the movement and flexibility of your hip. This exercise also helps to relieve pain and stiffness. Iliotibial band stretch An iliotibial band is a strong band of muscle tissue that runs from the outer side of your hip to the outer side of your thigh and knee. 1. Lie on your side with your left / right leg in the top position. 2. Bend your left / right knee and grab your ankle. Stretch out your bottom arm to help you balance. 3. Slowly bring your knee back so your thigh is behind your body. 4. Slowly lower your knee toward the floor until you feel a gentle stretch on the outside of your left / right thigh. If you do not feel a stretch and your knee will not fall farther, place the heel of your other foot on top of your knee and pull your knee down toward the floor with your foot. 5. Hold this position for __________ seconds. 6. Slowly return to the starting position. Repeat __________ times. Complete this exercise __________ times a day. Strengthening exercises These exercises build strength and endurance in your hip and pelvis. Endurance is the ability to use your muscles for a long time, even after they get tired. Bridge This exercise strengthens the muscles that move your thigh backward (hip extensors). 1. Lie on your back on a firm surface with your knees bent and your feet flat on the floor. 2. Tighten your buttocks muscles and lift  your buttocks off the floor until your trunk is level with your thighs. ? Do not arch your back. ? You should feel the muscles working in your buttocks and the back of your thighs. If you do not feel these muscles, slide your feet 1-2 inches (2.5-5 cm) farther away from your buttocks. ? If this exercise is too easy, try doing it with your arms crossed over your chest. 3. Hold this position for __________ seconds. 4. Slowly lower your hips to the starting position. 5. Let your muscles relax completely after each repetition. Repeat __________ times. Complete this exercise __________ times a day. Squats This exercise strengthens the muscles in front of your thigh and knee (quadriceps). 1. Stand in front of a table, with your feet and knees pointing straight ahead. You may  rest your hands on the table for balance but not for support. 2. Slowly bend your knees and lower your hips like you are going to sit in a chair. ? Keep your weight over your heels, not over your toes. ? Keep your lower legs upright so they are parallel with the table legs. ? Do not let your hips go lower than your knees. ? Do not bend lower than told by your health care provider. ? If your hip pain increases, do not bend as low. 3. Hold the squat position for __________ seconds. 4. Slowly push with your legs to return to standing. Do not use your hands to pull yourself to standing. Repeat __________ times. Complete this exercise __________ times a day. Hip hike 1. Stand sideways on a bottom step. Stand on your left / right leg with your other foot unsupported next to the step. You can hold on to the railing or wall for balance if needed. 2. Keep your knees straight and your torso square. Then lift your left / right hip up toward the ceiling. 3. Hold this position for __________ seconds. 4. Slowly let your left / right hip lower toward the floor, past the starting position. Your foot should get closer to the floor. Do not lean  or bend your knees. Repeat __________ times. Complete this exercise __________ times a day. Single leg stand 1. Without shoes, stand near a railing or in a doorway. You may hold on to the railing or door frame as needed for balance. 2. Squeeze your left / right buttock muscles, then lift up your other foot. ? Do not let your left / right hip push out to the side. ? It is helpful to stand in front of a mirror for this exercise so you can watch your hip. 3. Hold this position for __________ seconds. Repeat __________ times. Complete this exercise __________ times a day. This information is not intended to replace advice given to you by your health care provider. Make sure you discuss any questions you have with your health care provider. Document Revised: 09/03/2018 Document Reviewed: 09/03/2018 Elsevier Patient Education  2020 [employer].

## 2019-10-04 NOTE — Progress Notes (Signed)
Patient: Renee Knapp MRN: [id number] DOB: Sep 20, 1975 PCP: [name], MD     Subjective:  Chief Complaint  Patient presents with  . Knee Pain    Left; Starting Friday. She also states her knee pops when walking.   . Joint Swelling    left knee  . Hip Pain    She states sitting long periods of time causes it to hurt.    HPI: The patient is a 44 y.o. female who presents today for pain and swelling in the left knee, with popping while walking, starting on last Friday. She denies any trauma or precipitating event. She has started to walk to work. She has to walk 5 blocks to her job and this started about a week ago. She doesn't really do any other exercise. She states it just started hurting and she can feel popping in her knee. She states she has pain with extension and can barely extend all the way out. She feels resistance with flexion. She has swelling. Pain rated as 9/10 and described as constant. She has less pain when she is non weight bearing. Twisting motions make it worse. She has taken tylenol only with no relief. She has not iced it.    She also complains of right hip pain. She noticed this on Friday as well. No trauma/precipitating event. The pain is not constant and she only notices it after she has sat for long periods of time. She feels it only when she stands up. She feels the pain on her iliac crest. Pain goes away after she starts walking. It does not last very long. She describes it as a dull pain. No radiation.   Denies any fever/chills, redness in joints.   Review of Systems  Respiratory: Negative for cough, shortness of breath and wheezing.   Cardiovascular: Negative for chest pain and palpitations.  Musculoskeletal: Positive for joint swelling.  Neurological: Negative for dizziness, light-headedness and headaches.    Allergies Patient is allergic to bee venom and reglan [metoclopramide].  Past Medical History Patient  has a past medical history of  Anemia, Diabetes mellitus without complication (HCC), Obesity, Plantar fasciitis, bilateral, and SVD (spontaneous vaginal delivery).  Surgical History Patient  has a past surgical history that includes Tubal ligation; Dilation and curettage of uterus; Wisdom tooth extraction; Laparoscopic assisted vaginal hysterectomy (N/A, 02/24/2014); and Cystoscopy (N/A, 02/24/2014).  Family History Pateint's family history includes Cancer in an other family member; Diabetes in her mother and another family member; Hearing loss in her son; Heart disease in her mother; High Cholesterol in her father; Hypertension in her father, mother, sister, sister, and another family member; Kidney disease in her sister; Stroke in her sister.  Social History Patient  reports that she has never smoked. She has never used smokeless tobacco. She reports that she does not drink alcohol or use drugs.    Objective: Vitals:   10/04/19 0956  BP: 120/84  Pulse: 90  Temp: 98.7 F (37.1 C)  TempSrc: Temporal  SpO2: 97%  Weight: 209 lb (94.8 kg)  Height: 5\' 4"  (1.626 m)    Body mass index is 35.87 kg/m.  Physical Exam Vitals reviewed.  Constitutional:      Appearance: Normal appearance. She is obese.  HENT:     Head: Normocephalic and atraumatic.  Musculoskeletal:     Comments: Left knee: no edema or warmth. No erythema. TTP over entire aspect of anterior knee, but great pain over bilateral menisci. Negative anterior draw sign. Can not  fully extend knee to 180 degrees due to pain. Can flex to 90 degrees, but is painful. No crepitus or popping appreciated. +mcmurray's sign. Pain with both valgus and varus strain. Walks with limp.   Right hip: point TTP over greater trochanteric process.   Neurological:     Mental Status: She is alert.    Left knee xray: no acute findings. Official read pending.     Assessment/plan: 1. Acute pain of left knee Concern for meniscal tear/injury. Needs MRI. This was ordered. Will do  voltaren gel qid/tramadol prn, RICE therapy. Hopefully can get mri scheduled within month. Can also get sleeve/brace for knee to see if this helps as well.  - DG Knee Complete 4 Views Left; Future  2. Trochanteric bursitis of right hip voltaren gel qid, ice, rest and steroid burst. Home exercises given as well.   She is to let me know if not improving. May be worth checking labs and sending to PT as well. Fever/chills swelling/redness go to ER.   This visit occurred during the SARS-CoV-2 public health emergency.  Safety protocols were in place, including screening questions prior to the visit, additional usage of staff PPE, and extensive cleaning of exam room while observing appropriate contact time as indicated for disinfecting solutions.    Return if symptoms worsen or fail to improve.   Orma Flaming, MD [place]   10/04/2019

## 2019-10-07 ENCOUNTER — Encounter: Payer: Self-pay | Admitting: Family Medicine

## 2019-10-07 MED ORDER — ACETAMINOPHEN-CODEINE #3 300-30 MG PO TABS: 1.0000 | ORAL_TABLET | ORAL | 0 refills | Status: DC | PRN

## 2019-10-07 NOTE — Telephone Encounter (Signed)
FYI...Please Advise  °

## 2019-10-17 ENCOUNTER — Encounter: Payer: Self-pay | Admitting: Family Medicine

## 2019-10-17 ENCOUNTER — Other Ambulatory Visit: Payer: Self-pay

## 2019-10-17 DIAGNOSIS — M25562 Pain in left knee: Secondary | ICD-10-CM

## 2019-10-18 ENCOUNTER — Ambulatory Visit: Payer: BC Managed Care – PPO | Admitting: Family Medicine

## 2019-10-18 ENCOUNTER — Encounter: Payer: Self-pay | Admitting: Family Medicine

## 2019-10-18 ENCOUNTER — Other Ambulatory Visit: Payer: Self-pay

## 2019-10-18 VITALS — BP 120/80 | HR 101 | Temp 98.1°F | Ht 64.0 in | Wt 207.4 lb

## 2019-10-18 DIAGNOSIS — M25562 Pain in left knee: Secondary | ICD-10-CM | POA: Diagnosis not present

## 2019-10-18 DIAGNOSIS — M7122 Synovial cyst of popliteal space [Baker], left knee: Secondary | ICD-10-CM

## 2019-10-18 NOTE — Progress Notes (Signed)
Patient: Renee Knapp MRN: [id number] DOB: Aug 20, 1975 PCP: [name], MD     Subjective:  Chief Complaint  Patient presents with  . Knee Pain    She says she as gotten no relief, and the medicine just puts her to sleep.    HPI: The patient is a 44 y.o. female who presents today for Knee pain. She says that she has not gotten any relief, and her pain medicine just puts her to sleep. She has her MRI scheduled for June 14th and I did a referral to ortho yesterday she has appointment with them on June 1st.  I asked her to get voltaren, but she states the pharmacies are sold out of this. I also gave her tylenol 3 and tramadol and both put her to sleep. She can not take it during the day. Pain is the same and swelling is the same. Has not gotten worse. She states her knee feels like a rubberband is going to pop and still hears or feels a popping sensation.  It is harder to flex her knee. Denies any swelling/redness or pain in her calf.    Review of Systems  Constitutional: Negative for chills and fever.  Respiratory: Negative for cough and shortness of breath.   Cardiovascular: Negative for chest pain and palpitations.  Musculoskeletal: Positive for arthralgias (left knee ) and joint swelling.  Neurological: Negative for dizziness and headaches.    Allergies Patient is allergic to bee venom and reglan [metoclopramide].  Past Medical History Patient  has a past medical history of Anemia, Diabetes mellitus without complication (HCC), Obesity, Plantar fasciitis, bilateral, and SVD (spontaneous vaginal delivery).  Surgical History Patient  has a past surgical history that includes Tubal ligation; Dilation and curettage of uterus; Wisdom tooth extraction; Laparoscopic assisted vaginal hysterectomy (N/A, 02/24/2014); and Cystoscopy (N/A, 02/24/2014).  Family History Pateint's family history includes Cancer in an other family member; Diabetes in her mother and another family member;  Hearing loss in her son; Heart disease in her mother; High Cholesterol in her father; Hypertension in her father, mother, sister, sister, and another family member; Kidney disease in her sister; Stroke in her sister.  Social History Patient  reports that she has never smoked. She has never used smokeless tobacco. She reports that she does not drink alcohol or use drugs.    Objective: Vitals:   10/18/19 1122  BP: 120/80  Pulse: (!) 101  Temp: 98.1 F (36.7 C)  TempSrc: Temporal  SpO2: 96%  Weight: 207 lb 6.4 oz (94.1 kg)  Height: 5\' 4"  (1.626 m)    Body mass index is 35.6 kg/m.  Physical Exam Vitals reviewed.  Constitutional:      Appearance: Normal appearance. She is obese.  Pulmonary:     Effort: Pulmonary effort is normal.  Musculoskeletal:        General: Swelling and tenderness present.     Comments: Left knee New swelling in popliteal fossa, was not there on initial exam. Feels like bakers cyst. She is tender to palpation over this.  Not overtly edematous or warm on anterior aspect of knee. Still pain at inferior aspect of patella on both medial and lateral sides. Pain with flexion and extension.   Skin:    General: Skin is warm.     Findings: No erythema.  Neurological:     Mental Status: She is alert.  Psychiatric:        Mood and Affect: Mood normal.  Behavior: Behavior normal.        Assessment/plan: 1. Baker's cyst, left New finding from initial exam. Originally thought she had meniscal injury and likely this could be secondary to that. Continue conservative therapy. Has appointment with ortho in 4 days. Mri scheduled for 6/14. Strict precautions given for calf pain, swelling in calf or redness to go to ER.   2. Acute pain of left knee Follow up. Has pain meds, brace and ortho appointment next week. Suspect meniscal tear and MRI scheduled for 6/14.   This visit occurred during the SARS-CoV-2 public health emergency.  Safety protocols were in place,  including screening questions prior to the visit, additional usage of staff PPE, and extensive cleaning of exam room while observing appropriate contact time as indicated for disinfecting solutions.     Return if symptoms worsen or fail to improve.  Orma Flaming, MD [place]   10/18/2019

## 2019-10-18 NOTE — Patient Instructions (Signed)
Baker Cyst ° °A Baker cyst, also called a popliteal cyst, is a growth that forms at the back of the knee. The cyst forms when the fluid-filled sac (bursa) that cushions the knee joint becomes enlarged. °What are the causes? °In most cases, a Baker cyst results from another knee problem that causes swelling inside the knee. This makes the fluid inside the knee joint (synovial fluid) flow into the bursa behind the knee, causing the bursa to enlarge. °What increases the risk? °You may be more likely to develop a Baker cyst if you already have a knee problem, such as: °· A tear in cartilage that cushions the knee joint (meniscal tear). °· A tear in the tissues that connect the bones of the knee joint (ligament tear). °· Knee swelling from osteoarthritis, rheumatoid arthritis, or gout. °What are the signs or symptoms? °The main symptom of this condition is a lump behind the knee. This may be the only symptom of the condition. The lump may be painful, especially when the knee is straightened. If the lump is painful, the pain may come and go. The knee may also be stiff. °Symptoms may quickly get more severe if the cyst breaks open (ruptures). If the cyst ruptures, you may feel the following in your knee and calf: °· Sudden or worsening pain. °· Swelling. °· Bruising. °· Redness in the calf. °A Baker cyst does not always cause symptoms. °How is this diagnosed? °This condition may be diagnosed based on your symptoms and medical history. Your health care provider will also do a physical exam. This may include: °· Feeling the cyst to check whether it is tender. °· Checking your knee for signs of another knee condition that causes swelling. °You may have imaging tests, such as: °· X-rays. °· MRI. °· Ultrasound. °How is this treated? °A Baker cyst that is not painful may go away without treatment. If the cyst gets large or painful, it will likely get better if the underlying knee problem is treated. °If needed, treatment for a  Baker cyst may include: °· Resting. °· Keeping weight off of the knee. This means not leaning on the knee to support your body weight. °· Taking NSAIDs, such as ibuprofen, to reduce pain and swelling. °· Having a procedure to drain the fluid from the cyst with a needle (aspiration). You may also get an injection of a medicine that reduces swelling (steroid). °· Having surgery. This may be needed if other treatments do not work. This usually involves correcting knee damage and removing the cyst. °Follow these instructions at home: ° °Activity °· Rest as told by your health care provider. °· Avoid activities that make pain or swelling worse. °· Return to your normal activities as told by your health care provider. Ask your health care provider what activities are safe for you. °· Do not use the injured limb to support your body weight until your health care provider says that you can. Use crutches as told by your health care provider. °General instructions °· Take over-the-counter and prescription medicines only as told by your health care provider. °· Keep all follow-up visits as told by your health care provider. This is important. °Contact a health care provider if: °· You have knee pain, stiffness, or swelling that does not get better. °Get help right away if: °· You have sudden or worsening pain and swelling in your calf area. °Summary °· A Baker cyst, also called a popliteal cyst, is a growth that forms at the   back of the knee. °· In most cases, a Baker cyst results from another knee problem that causes swelling inside the knee. °· A Baker cyst that is not painful may go away without treatment. °· If needed, treatment for a Baker cyst may include resting, keeping weight off of the knee, medicines, or draining fluid from the cyst. °· Surgery may be needed if other treatments are not effective. °This information is not intended to replace advice given to you by your health care provider. Make sure you discuss any  questions you have with your health care provider. °Document Revised: 09/21/2018 Document Reviewed: 09/21/2018 °Elsevier Patient Education © 2020 [employer] Inc. ° ° ° °

## 2019-10-22 ENCOUNTER — Ambulatory Visit: Payer: BC Managed Care – PPO | Admitting: Physician Assistant

## 2019-10-22 ENCOUNTER — Encounter: Payer: Self-pay | Admitting: Orthopaedic Surgery

## 2019-10-22 ENCOUNTER — Other Ambulatory Visit: Payer: Self-pay

## 2019-10-22 DIAGNOSIS — M25562 Pain in left knee: Secondary | ICD-10-CM | POA: Diagnosis not present

## 2019-10-22 NOTE — Progress Notes (Signed)
Office Visit Note   Patient: Renee Knapp           Date of Birth: Dec 29, 1975           MRN: [id number] Visit Date: 10/22/2019              Requested by: Orma Flaming, MD [address] [place],  [place] [address] PCP: Orma Flaming, MD     Assessment & Plan: Visit Diagnoses: No diagnosis found.  Plan: Plan she has an MRI scheduled for 11/04/2019.  Have her follow-up after the MRI if no results discuss further treatment.  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  Recommend she stay away from any twisting or high impact activities.  Did take her out of work until this Thursday per her report quest due to knee pain.  Questions were encouraged and answered.  Follow-Up Instructions: Return After MRI.   Orders:  No orders of the defined types were placed in this encounter.  No orders of the defined types were placed in this encounter.     Procedures: No procedures performed   Clinical Data: No additional findings.   Subjective: Chief Complaint  Patient presents with  . Left Knee - Pain    HPI M Renee Knapp is a 44 year old female comes in today as a new patient for left knee pain.  Pains been ongoing for a month no actual injury but thinks she may have twisted it getting [place] the city bus.  She had swelling.  Notes some giving way of the knee and painful popping.  No other mechanical symptoms.  She has been taking tramadol and Tylenol 3.  4 since this past Friday she works in a daycare.  No prior pain in the knee.  She did have radiographs of the knee which are reviewed on the canopy system.  4 views left knee dated 10/04/2019 showed no acute fractures.  Knee is well located.  Medial lateral joint lines well-maintained.  Minimal patellofemoral changes.  Review of Systems Negative for fevers chills shortness of breath chest pain  Objective: Vital Signs: LMP 02/06/2014 (LMP Unknown)   Physical Exam Constitutional:      Appearance: She is not  ill-appearing or diaphoretic.  Pulmonary:     Effort: Pulmonary effort is normal.  Neurological:     Mental Status: She is alert and oriented to person, place, and time.  Psychiatric:        Mood and Affect: Mood normal.     Ortho Exam Bilateral knees good range of motion.  No instability valgus varus stressing.  Left knee tenderness over the medial joint line.  McMurray's positive.  Passive range of motion reveals clicking in the left knee peripatellar region that is painful.  No abnormal warmth erythema or effusion of either knee  Specialty Comments:  No specialty comments available.  Imaging: No results found.   PMFS History: Patient Active Problem List   Diagnosis Date Noted  . GAD (generalized anxiety disorder) 08/30/2018  . Vitamin D deficiency 01/18/2018  . Hyperlipidemia 11/25/2017  . Controlled type 2 diabetes mellitus without complication, without long-term current use of insulin ([facility]) 11/10/2017  . Acute pericarditis 09/01/2015  . S/P laparoscopic assisted vaginal hysterectomy (LAVH) 02/24/2014   Past Medical History:  Diagnosis Date  . Anemia    History  . Diabetes mellitus without complication (HCC)    diet and exercise controlled - no meds  . Obesity   . Plantar fasciitis, bilateral  History - recvd cortisone injections bilateral- no current prob as 01/2014  . SVD (spontaneous vaginal delivery)    x 2    Family History  Problem Relation Age of Onset  . Cancer Other   . Hypertension Other   . Diabetes Other   . Diabetes Mother   . Heart disease Mother   . Hypertension Mother   . Hypertension Father   . High Cholesterol Father   . Hypertension Sister   . Kidney disease Sister   . Stroke Sister   . Hypertension Sister   . Hearing loss Son     Past Surgical History:  Procedure Laterality Date  . CYSTOSCOPY N/A 02/24/2014   Procedure: CYSTOSCOPY;  Surgeon: Essie Hart, MD;  Location: [facility];  Service: Gynecology;  Laterality: N/A;  . DILATION AND  CURETTAGE OF UTERUS     MAB  . LAPAROSCOPIC ASSISTED VAGINAL HYSTERECTOMY N/A 02/24/2014   Procedure: LAPAROSCOPIC ASSISTED VAGINAL HYSTERECTOMY WITH BILATERAL SALPINGECTOMY ;  Surgeon: Essie Hart, MD;  Location: [facility];  Service: Gynecology;  Laterality: N/A;  . TUBAL LIGATION    . WISDOM TOOTH EXTRACTION     Social History   Occupational History  . Not on file  Tobacco Use  . Smoking status: Never Smoker  . Smokeless tobacco: Never Used  Substance and Sexual Activity  . Alcohol use: No  . Drug use: No  . Sexual activity: Not Currently    Birth control/protection: Surgical

## 2019-11-04 ENCOUNTER — Ambulatory Visit
Admission: RE | Admit: 2019-11-04 | Discharge: 2019-11-04 | Disposition: A | Discharge status: Home / Self Care | Payer: BC Managed Care – PPO | Source: Ambulatory Visit | Attending: Family Medicine | Admitting: Family Medicine

## 2019-11-04 ENCOUNTER — Other Ambulatory Visit: Payer: Self-pay

## 2019-11-04 DIAGNOSIS — M25562 Pain in left knee: Secondary | ICD-10-CM

## 2019-11-08 ENCOUNTER — Other Ambulatory Visit: Payer: Self-pay | Admitting: Family Medicine

## 2019-11-13 ENCOUNTER — Ambulatory Visit: Payer: BC Managed Care – PPO | Admitting: Physician Assistant

## 2019-11-13 ENCOUNTER — Encounter: Payer: Self-pay | Admitting: Physician Assistant

## 2019-11-13 ENCOUNTER — Other Ambulatory Visit: Payer: Self-pay

## 2019-11-13 ENCOUNTER — Ambulatory Visit: Payer: Self-pay

## 2019-11-13 DIAGNOSIS — M25562 Pain in left knee: Secondary | ICD-10-CM | POA: Diagnosis not present

## 2019-11-13 NOTE — Progress Notes (Signed)
Subjective: She is here for aspiration of a Baker's cyst in the left knee.  Objective: She has patellofemoral crepitus.  She has fullness in the popliteal fossa consistent with a cyst.  Procedure: Ultrasound-guided Baker's cyst aspiration and injection: After sterile prep with Betadine, injected 8 cc 1% lidocaine without epinephrine, then aspirated 18 cc of synovial fluid with cartilaginous material, then injected 40 mg methylprednisolone.  She felt immediately better.

## 2019-11-13 NOTE — Progress Notes (Signed)
Office Visit Note   Patient: Renee Knapp           Date of Birth: April 17, 1976           MRN: [id number] Visit Date: 11/13/2019              Requested by: [name], MD [address] [place],  [place] [address] PCP: [name], MD   Assessment & Plan: Visit Diagnoses:  1. Acute pain of left knee     Plan: Discussed with patient quad strengthening exercises.  We will send her for aspiration possible injection of the left knee Baker's cyst under ultrasound with Dr. Prince Rome.  See her back in 2 weeks see what type of response she had to the injection and aspiration.  Questions were encouraged and answered at length.  Follow-Up Instructions: Return in about 4 weeks (around 12/11/2019).   Orders:  No orders of the defined types were placed in this encounter.  No orders of the defined types were placed in this encounter.     Procedures: No procedures performed   Clinical Data: No additional findings.   Subjective: Chief Complaint  Patient presents with  . Left Knee - Follow-up    HPI Renee Knapp returns today follow-up of her left knee.  She continues to have pain in the posterior aspect of the left knee.  No significant change in the knee pain or symptoms.  Again she has had some giving way and painful popping in the knee.  She notes some grinding underneath the knee At times.  She underwent an MRI of the knee dated 11/04/2019 is here for review of the MRI.  MRI images are reviewed with the patient.  She has a large Baker's cyst in the popliteal fossa region that measures 3.1 cm AP by 1.7 cm transverse by 4.3 cm craniocaudal.  Minimal degenerative changes of the patellofemoral joint.  Otherwise knee is well-preserved.  No meniscal tear.  No ligamentous injury.  Review of Systems See HPI.  Objective: Vital Signs: LMP 02/06/2014 (LMP Unknown)   Physical Exam General: Well-developed well-nourished female no acute distress mood affect appropriate Ortho  Exam Left knee full extension full flexion.  Passive range of motion reveals slight patellofemoral crepitus.  Medial popliteal region.  No abnormal warmth erythema right knee. Specialty Comments:  No specialty comments available.  Imaging: No results found.   PMFS History: Patient Active Problem List   Diagnosis Date Noted  . GAD (generalized anxiety disorder) 08/30/2018  . Vitamin D deficiency 01/18/2018  . Hyperlipidemia 11/25/2017  . Controlled type 2 diabetes mellitus without complication, without long-term current use of insulin (HCC) 11/10/2017  . Acute pericarditis 09/01/2015  . S/P laparoscopic assisted vaginal hysterectomy (LAVH) 02/24/2014   Past Medical History:  Diagnosis Date  . Anemia    History  . Diabetes mellitus without complication (HCC)    diet and exercise controlled - no meds  . Obesity   . Plantar fasciitis, bilateral    History - recvd cortisone injections bilateral- no current prob as 01/2014  . SVD (spontaneous vaginal delivery)    x 2    Family History  Problem Relation Age of Onset  . Cancer Other   . Hypertension Other   . Diabetes Other   . Diabetes Mother   . Heart disease Mother   . Hypertension Mother   . Hypertension Father   . High Cholesterol Father   . Hypertension Sister   . Kidney disease Sister   .  Stroke Sister   . Hypertension Sister   . Hearing loss Son     Past Surgical History:  Procedure Laterality Date  . CYSTOSCOPY N/A 02/24/2014   Procedure: CYSTOSCOPY;  Surgeon: Sanjuana Kava, MD;  Location: [facility];  Service: Gynecology;  Laterality: N/A;  . DILATION AND CURETTAGE OF UTERUS     MAB  . LAPAROSCOPIC ASSISTED VAGINAL HYSTERECTOMY N/A 02/24/2014   Procedure: LAPAROSCOPIC ASSISTED VAGINAL HYSTERECTOMY WITH BILATERAL SALPINGECTOMY ;  Surgeon: Sanjuana Kava, MD;  Location: [facility];  Service: Gynecology;  Laterality: N/A;  . TUBAL LIGATION    . WISDOM TOOTH EXTRACTION     Social History   Occupational History  . Not on file   Tobacco Use  . Smoking status: Never Smoker  . Smokeless tobacco: Never Used  Vaping Use  . Vaping Use: Never used  Substance and Sexual Activity  . Alcohol use: No  . Drug use: No  . Sexual activity: Not Currently    Birth control/protection: Surgical

## 2019-11-20 ENCOUNTER — Ambulatory Visit: Payer: BC Managed Care – PPO | Admitting: Orthopaedic Surgery

## 2019-11-29 ENCOUNTER — Encounter: Payer: Self-pay | Admitting: Physician Assistant

## 2019-11-29 ENCOUNTER — Telehealth (INDEPENDENT_AMBULATORY_CARE_PROVIDER_SITE_OTHER): Payer: BC Managed Care – PPO | Admitting: Physician Assistant

## 2019-11-29 VITALS — Temp 98.9°F | Ht 64.0 in | Wt 204.0 lb

## 2019-11-29 DIAGNOSIS — J029 Acute pharyngitis, unspecified: Secondary | ICD-10-CM

## 2019-11-29 MED ORDER — AMOXICILLIN 875 MG PO TABS: 875.0000 mg | ORAL_TABLET | Freq: Two times a day (BID) | ORAL | 0 refills | Status: DC

## 2019-11-29 NOTE — Progress Notes (Signed)
Virtual Visit via Video   I connected with Renee Knapp on 11/29/19 at 12:00 PM EDT by a video enabled telemedicine application and verified that I am speaking with the correct person using two identifiers. Location patient: Home Location provider: [facility], Office Persons participating in the virtual visit: [name], Degeorge PA-C, [name], LPN   I discussed the limitations of evaluation and management by telemedicine and the availability of in person appointments. The patient expressed understanding and agreed to proceed.  I acted as a Neurosurgeon for [employer], PA-C [place], [place]   Subjective:   HPI:   Sore throat Pt c/o sore throat since Sunday night. Took Mucinex, which did not provide any relief of symptoms. She also took [employer] which helps temporarily. Denies fever, chills and cough. Noticed this morning both ears are starting to ache.   Has two family members in her house with similar symptoms. Patient is currently fully COVID vaccinated. 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  ROS: See pertinent positives and negatives per HPI.  Patient Active Problem List   Diagnosis Date Noted  . GAD (generalized anxiety disorder) 08/30/2018  . Vitamin D deficiency 01/18/2018  . Hyperlipidemia 11/25/2017  . Controlled type 2 diabetes mellitus without complication, without long-term current use of insulin (HCC) 11/10/2017  . Acute pericarditis 09/01/2015  . S/P laparoscopic assisted vaginal hysterectomy (LAVH) 02/24/2014    Social History   Tobacco Use  . Smoking status: Never Smoker  . Smokeless tobacco: Never Used  Substance Use Topics  . Alcohol use: No    Current Outpatient Medications:  .  Bayer Microlet Lancets lancets, USE 1 SINGLE USE LANCETS AS NEEDED TO CHECK BLOOD SUGAR, Disp: 100 each, Rfl: 1 .  cholecalciferol (VITAMIN D3) 25 MCG (1000 UNIT) tablet,  Take 1,000 Units by mouth daily., Disp: , Rfl:  .  EPINEPHrine (EPIPEN JR) 0.15 MG/0.3ML injection, Inject 0.6 mLs (0.3 mg total) into the muscle once as needed for up to 1 dose for anaphylaxis (for allergic reaction)., Disp: 2 each, Rfl: 1 .  glucose blood (ACCU-CHEK ACTIVE STRIPS) test strip, Use as instructed, Disp: 50 each, Rfl: 12 .  ibuprofen (ADVIL) 800 MG tablet, Take 800 mg by mouth every 8 (eight) hours as needed., Disp: , Rfl:  .  metFORMIN (GLUCOPHAGE-XR) 500 MG 24 hr tablet, Take 2 tablets (1,000 mg total) by mouth 2 (two) times daily., Disp: 360 tablet, Rfl: 1 .  rosuvastatin (CRESTOR) 10 MG tablet, Take 1 tablet (10 mg total) by mouth daily., Disp: 90 tablet, Rfl: 0 .  Semaglutide,0.25 or 0.5MG /DOS, (OZEMPIC, 0.25 OR 0.5 MG/DOSE,) 2 MG/1.5ML SOPN, Inject 0.5 mg into the skin once a week., Disp: 3 pen, Rfl: 1 .  acetaminophen-codeine (TYLENOL #3) 300-30 MG tablet, Take 1 tablet by mouth every 4 (four) hours as needed for moderate pain. (Patient not taking: Reported on 11/29/2019), Disp: 30 tablet, Rfl: 0 .  amoxicillin (AMOXIL) 875 MG tablet, Take 1 tablet (875 mg total) by mouth 2 (two) times daily., Disp: 20 tablet, Rfl: 0 .  diclofenac Sodium (VOLTAREN) 1 % GEL, Apply 4 g topically 4 (four) times daily. (Patient not taking: Reported on 11/29/2019), Disp: 100 g, Rfl: 1 .  predniSONE (DELTASONE) 20 MG tablet, Take 20 mg by mouth 2 (two) times daily. (Patient not taking: Reported on 11/29/2019), Disp: , Rfl:  .  Vitamin D, Ergocalciferol, (  DRISDOL) 1.25 MG (50000 UNIT) CAPS capsule, One capsule by mouth once a week for 12 weeks. Then take 2000IU/day (Patient not taking: Reported on 11/29/2019), Disp: 12 capsule, Rfl: 0  Allergies  Allergen Reactions  . Bee Venom Anaphylaxis  . Reglan [Metoclopramide] Nausea And Vomiting and Anxiety    Objective:   VITALS: Per patient if applicable, see vitals. GENERAL: Alert, appears well and in no acute distress. HEENT: Atraumatic, conjunctiva clear, no  obvious abnormalities on inspection of external nose and ears. NECK: Normal movements of the head and neck. CARDIOPULMONARY: No increased WOB. Speaking in clear sentences. I:E ratio WNL.  MS: Moves all visible extremities without noticeable abnormality. PSYCH: Pleasant and cooperative, well-groomed. Speech normal rate and rhythm. Affect is appropriate. Insight and judgement are appropriate. Attention is focused, linear, and appropriate.  NEURO: CN grossly intact. Oriented as arrived to appointment on time with no prompting. Moves both UE equally.  SKIN: No obvious lesions, wounds, erythema, or cyanosis noted on face or hands.  Assessment and Plan:   Renee Knapp was seen today for sore throat.  Diagnoses and all orders for this visit:  Pharyngitis, unspecified etiology No red flags on discussion. Unfortunately, we are unable to test for rapid strep at this time due to lack of PPE. Will treat for possible otitis media and strep throat with oral amoxicillin per orders. Work note provided for patient as well. Follow-up if symptoms worsen or persist despite treatment.  Other orders -     amoxicillin (AMOXIL) 875 MG tablet; Take 1 tablet (875 mg total) by mouth 2 (two) times daily.    . Reviewed expectations re: course of current medical issues. . Discussed self-management of symptoms. . Outlined signs and symptoms indicating need for more acute intervention. . Patient verbalized understanding and all questions were answered. Marland Kitchen Health Maintenance issues including appropriate healthy diet, exercise, and smoking avoidance were discussed with patient. . See orders for this visit as documented in the electronic medical record.  I discussed the assessment and treatment plan with the patient. The patient was provided an opportunity to ask questions and all were answered. The patient agreed with the plan and demonstrated an understanding of the instructions.   The patient was advised to call back or  seek an in-person evaluation if the symptoms worsen or if the condition fails to improve as anticipated.   CMA or LPN served as scribe during this visit. History, Physical, and Plan performed by medical provider. The above documentation has been reviewed and is accurate and complete.   [place], [place] 11/29/2019

## 2019-12-02 ENCOUNTER — Encounter: Payer: Self-pay | Admitting: Family Medicine

## 2019-12-02 ENCOUNTER — Other Ambulatory Visit: Payer: Self-pay

## 2019-12-02 ENCOUNTER — Encounter: Payer: Self-pay | Admitting: Physician Assistant

## 2019-12-02 ENCOUNTER — Ambulatory Visit (INDEPENDENT_AMBULATORY_CARE_PROVIDER_SITE_OTHER): Payer: BC Managed Care – PPO | Admitting: Physician Assistant

## 2019-12-02 ENCOUNTER — Ambulatory Visit: Payer: BC Managed Care – PPO | Attending: Internal Medicine

## 2019-12-02 VITALS — HR 106 | Ht 64.0 in | Wt 204.0 lb

## 2019-12-02 DIAGNOSIS — Z20822 Contact with and (suspected) exposure to covid-19: Secondary | ICD-10-CM | POA: Diagnosis not present

## 2019-12-02 DIAGNOSIS — J029 Acute pharyngitis, unspecified: Secondary | ICD-10-CM

## 2019-12-02 DIAGNOSIS — H9203 Otalgia, bilateral: Secondary | ICD-10-CM | POA: Diagnosis not present

## 2019-12-02 LAB — POCT RAPID STREP A (OFFICE): Rapid Strep A Screen: POSITIVE — AB

## 2019-12-02 MED ORDER — AMOXICILLIN-POT CLAVULANATE 875-125 MG PO TABS: 1.0000 | ORAL_TABLET | Freq: Two times a day (BID) | ORAL | 0 refills | Status: DC

## 2019-12-02 NOTE — Patient Instructions (Signed)
It was great to see you!  Stop Amoxicillin.  Start Augmentin antibiotic.  Let me know about your COVID test.  Stay out until Wednesday.   Take care,  Jarold Motto PA-C

## 2019-12-02 NOTE — Progress Notes (Signed)
Renee Knapp is a 44 y.o. female here for a follow up of a pre-existing problem.  I acted as a Neurosurgeon for [employer], PA-C Renee Mull, LPN   History of Present Illness:   Chief Complaint  Patient presents with  . Sore Throat  . Headache  . Otalgia    HPI   Sore throat Pt following up from visit on 11/29/2019. She is c/o sore throat, bilateral ear pain and started with a headache today. Pt did go for COVID testing today, results pending. Pt's main concern is her ears. Pt said she started antibiotic that was given to her on Friday. Denies dizziness, abdominal pain or blurred vision.  She has limited appetite but is able to eat.   Works at a daycare.    Past Medical History:  Diagnosis Date  . Anemia    History  . Diabetes mellitus without complication (HCC)    diet and exercise controlled - no meds  . Obesity   . Plantar fasciitis, bilateral    History - recvd cortisone injections bilateral- no current prob as 01/2014  . SVD (spontaneous vaginal delivery)    x 2     Social History   Tobacco Use  . Smoking status: Never Smoker  . Smokeless tobacco: Never Used  Vaping Use  . Vaping Use: Never used  Substance Use Topics  . Alcohol use: No  . Drug use: No    Past Surgical History:  Procedure Laterality Date  . CYSTOSCOPY N/A 02/24/2014   Procedure: CYSTOSCOPY;  Surgeon: Renee Hart, MD;  Location: [facility];  Service: Gynecology;  Laterality: N/A;  . DILATION AND CURETTAGE OF UTERUS     MAB  . LAPAROSCOPIC ASSISTED VAGINAL HYSTERECTOMY N/A 02/24/2014   Procedure: LAPAROSCOPIC ASSISTED VAGINAL HYSTERECTOMY WITH BILATERAL SALPINGECTOMY ;  Surgeon: Renee Hart, MD;  Location: [facility];  Service: Gynecology;  Laterality: N/A;  . TUBAL LIGATION    . WISDOM TOOTH EXTRACTION      Family History  Problem Relation Age of Onset  . Cancer Other   . Hypertension Other   . Diabetes Other   . Diabetes Mother   . Heart disease Mother   . Hypertension Mother   .  Hypertension Father   . High Cholesterol Father   . Hypertension Sister   . Kidney disease Sister   . Stroke Sister   . Hypertension Sister   . Hearing loss Son     Allergies  Allergen Reactions  . Bee Venom Anaphylaxis  . Reglan [Metoclopramide] Nausea And Vomiting and Anxiety    Current Medications:   Current Outpatient Medications:  .  amoxicillin (AMOXIL) 875 MG tablet, Take 1 tablet (875 mg total) by mouth 2 (two) times daily., Disp: 20 tablet, Rfl: 0 .  Bayer Microlet Lancets lancets, USE 1 SINGLE USE LANCETS AS NEEDED TO CHECK BLOOD SUGAR, Disp: 100 each, Rfl: 1 .  cholecalciferol (VITAMIN D3) 25 MCG (1000 UNIT) tablet, Take 1,000 Units by mouth daily., Disp: , Rfl:  .  EPINEPHrine (EPIPEN JR) 0.15 MG/0.3ML injection, Inject 0.6 mLs (0.3 mg total) into the muscle once as needed for up to 1 dose for anaphylaxis (for allergic reaction)., Disp: 2 each, Rfl: 1 .  glucose blood (ACCU-CHEK ACTIVE STRIPS) test strip, Use as instructed, Disp: 50 each, Rfl: 12 .  ibuprofen (ADVIL) 800 MG tablet, Take 800 mg by mouth every 8 (eight) hours as needed., Disp: , Rfl:  .  metFORMIN (GLUCOPHAGE-XR) 500 MG 24 hr tablet,  Take 2 tablets (1,000 mg total) by mouth 2 (two) times daily., Disp: 360 tablet, Rfl: 1 .  rosuvastatin (CRESTOR) 10 MG tablet, Take 1 tablet (10 mg total) by mouth daily., Disp: 90 tablet, Rfl: 0 .  Semaglutide,0.25 or 0.5MG /DOS, (OZEMPIC, 0.25 OR 0.5 MG/DOSE,) 2 MG/1.5ML SOPN, Inject 0.5 mg into the skin once a week., Disp: 3 pen, Rfl: 1 .  acetaminophen-codeine (TYLENOL #3) 300-30 MG tablet, Take 1 tablet by mouth every 4 (four) hours as needed for moderate pain. (Patient not taking: Reported on 12/02/2019), Disp: 30 tablet, Rfl: 0 .  amoxicillin-clavulanate (AUGMENTIN) 875-125 MG tablet, Take 1 tablet by mouth 2 (two) times daily., Disp: 20 tablet, Rfl: 0   Review of Systems:   ROS  Negative unless otherwise specified per HPI.  Vitals:   Vitals:   12/02/19 1506  Pulse:  (!) 106  SpO2: 94%  Weight: 204 lb (92.5 kg)  Height: 5\' 4"  (1.626 m)     Body mass index is 35.02 kg/m.  Physical Exam:   Physical Exam Vitals and nursing note reviewed.  Constitutional:      General: She is not in acute distress.    Appearance: She is well-developed. She is not ill-appearing or toxic-appearing.  HENT:     Head: Normocephalic and atraumatic.     Right Ear: Ear canal and external ear normal. A middle ear effusion is present. Tympanic membrane is erythematous. Tympanic membrane is not retracted or bulging.     Left Ear: Ear canal and external ear normal. A middle ear effusion is present. Tympanic membrane is erythematous. Tympanic membrane is not retracted or bulging.     Ears:     Comments: L erythema > R erythema    Nose: Nose normal.     Right Sinus: No maxillary sinus tenderness or frontal sinus tenderness.     Left Sinus: No maxillary sinus tenderness or frontal sinus tenderness.     Mouth/Throat:     Pharynx: Uvula midline. No posterior oropharyngeal erythema.     Tonsils: 1+ on the right. 1+ on the left.  Eyes:     General: Lids are normal.     Conjunctiva/sclera: Conjunctivae normal.  Neck:     Trachea: Trachea normal.  Cardiovascular:     Rate and Rhythm: Normal rate and regular rhythm.     Heart sounds: Normal heart sounds, S1 normal and S2 normal.  Pulmonary:     Effort: Pulmonary effort is normal.     Breath sounds: Normal breath sounds. No decreased breath sounds, wheezing, rhonchi or rales.  Lymphadenopathy:     Cervical: No cervical adenopathy.  Skin:    General: Skin is warm and dry.  Neurological:     Mental Status: She is alert.  Psychiatric:        Speech: Speech normal.        Behavior: Behavior normal. Behavior is cooperative.     Results for orders placed or performed in visit on 12/02/19  POCT rapid strep A  Result Value Ref Range   Rapid Strep A Screen Positive (A) Negative    Assessment and Plan:   Renee Knapp was seen  today for sore throat, headache and otalgia.  Diagnoses and all orders for this visit:  Sore throat; Otalgia of both ears Rapid strep positive. Now with b/l ear infection, L>R. Will escalate antibiotic from amoxicillin to oral augmentin. Work note to be out until Wednesday. COVID test pending. Encouraged use of oral ibuprofen for pain and swelling.  Worsening precautions advised. -     POCT rapid strep A  Other orders -     amoxicillin-clavulanate (AUGMENTIN) 875-125 MG tablet; Take 1 tablet by mouth 2 (two) times daily.    . Reviewed expectations re: course of current medical issues. . Discussed self-management of symptoms. . Outlined signs and symptoms indicating need for more acute intervention. . Patient verbalized understanding and all questions were answered. . See orders for this visit as documented in the electronic medical record. . Patient received an After-Visit Summary.  CMA or LPN served as scribe during this visit. History, Physical, and Plan performed by medical provider. The above documentation has been reviewed and is accurate and complete.  Jarold Motto, PA-C

## 2019-12-02 NOTE — Telephone Encounter (Signed)
Please schedule an appointment for pt. ° ° °Thank You °

## 2019-12-02 NOTE — Telephone Encounter (Signed)
Patient is scheduled for 330 today with Sam

## 2019-12-03 LAB — SARS-COV-2, NAA 2 DAY TAT

## 2019-12-03 LAB — NOVEL CORONAVIRUS, NAA: SARS-CoV-2, NAA: NOT DETECTED

## 2019-12-04 MED ORDER — METFORMIN HCL ER 500 MG PO TB24: 1000.0000 mg | ORAL_TABLET | Freq: Two times a day (BID) | ORAL | 1 refills | Status: DC

## 2019-12-11 ENCOUNTER — Ambulatory Visit: Payer: BC Managed Care – PPO | Admitting: Physician Assistant

## 2019-12-23 ENCOUNTER — Ambulatory Visit (INDEPENDENT_AMBULATORY_CARE_PROVIDER_SITE_OTHER): Payer: BC Managed Care – PPO | Admitting: Physician Assistant

## 2019-12-23 ENCOUNTER — Other Ambulatory Visit: Payer: Self-pay

## 2019-12-23 ENCOUNTER — Encounter: Payer: Self-pay | Admitting: Physician Assistant

## 2019-12-23 DIAGNOSIS — M25562 Pain in left knee: Secondary | ICD-10-CM | POA: Diagnosis not present

## 2019-12-23 MED ORDER — METHYLPREDNISOLONE ACETATE 40 MG/ML IJ SUSP
40.0000 mg | INTRAMUSCULAR | Status: AC | PRN
  Administered 2019-12-23: 40 mg via INTRA_ARTICULAR

## 2019-12-23 MED ORDER — LIDOCAINE HCL 1 % IJ SOLN
3.0000 mL | INTRAMUSCULAR | Status: AC | PRN
  Administered 2019-12-23: 3 mL

## 2019-12-23 NOTE — Progress Notes (Signed)
HPI: Renee Knapp returns today follow-up of her left knee status post injection aspiration of Baker's cyst by Dr. Prince Rome under ultrasound.  She states the injection only lasted about a week.  She continues to have painful popping in the knee.  Again she had an MRI of the knee which showed mild patellofemoral degenerative changes but no meniscal or ligamentous injuries.  She has not had intra-articular injection of the left knee. Patient is also complaining of left foot arch pain.  She states she has had pain in this area in the past and had an injection which helped.  She denies any open back shoes.  Pain is worse whenever she first begins to walk after being seated for a while.  Review of systems see HPI otherwise negative noncontributory.  Physical exam: Left knee positive patellofemoral crepitus.  No instability valgus varus stressing.  No abnormal warmth erythema or effusion.  McMurray's is negative.  Impression: Left knee pain  Plan: Work on quad strengthening , discussed knee friendly exercises. Follow up in 2 weeks to see what type of response she had to the injection.   She will work on Dance movement psychotherapist.  Procedure Note  Patient: Renee Knapp             Date of Birth: 1975-10-31           MRN: [id number]             Visit Date: 12/23/2019  Procedures: Visit Diagnoses: No diagnosis found.  Large Joint Inj: L knee on 12/23/2019 5:16 PM Indications: pain Details: 22 G 1.5 in needle, anterolateral approach  Arthrogram: No  Medications: 3 mL lidocaine 1 %; 40 mg methylPREDNISolone acetate 40 MG/ML Outcome: tolerated well, no immediate complications Procedure, treatment alternatives, risks and benefits explained, specific risks discussed. Consent was given by the patient. Immediately prior to procedure a time out was called to verify the correct patient, procedure, equipment, support staff and site/side marked as required. Patient was prepped and draped in the usual sterile  fashion.

## 2020-01-06 ENCOUNTER — Ambulatory Visit: Payer: BC Managed Care – PPO | Admitting: Physician Assistant

## 2020-01-09 ENCOUNTER — Other Ambulatory Visit: Payer: Self-pay | Admitting: Family Medicine

## 2020-01-17 ENCOUNTER — Other Ambulatory Visit: Payer: Self-pay | Admitting: Sleep Medicine

## 2020-01-17 ENCOUNTER — Other Ambulatory Visit: Payer: BC Managed Care – PPO

## 2020-01-17 DIAGNOSIS — I471 Supraventricular tachycardia: Secondary | ICD-10-CM

## 2020-01-19 LAB — NOVEL CORONAVIRUS, NAA: SARS-CoV-2, NAA: NOT DETECTED

## 2020-01-19 LAB — SARS-COV-2, NAA 2 DAY TAT

## 2020-01-25 ENCOUNTER — Other Ambulatory Visit: Payer: Self-pay | Admitting: Family Medicine

## 2020-01-26 NOTE — Telephone Encounter (Signed)
Approved.  Renee Mustard, MD [facility]

## 2020-02-09 ENCOUNTER — Emergency Department (HOSPITAL_COMMUNITY)
Admission: EM | Admit: 2020-02-09 | Discharge: 2020-02-09 | Disposition: A | Discharge status: Home / Self Care | Payer: BC Managed Care – PPO | Attending: Emergency Medicine | Admitting: Emergency Medicine

## 2020-02-09 ENCOUNTER — Other Ambulatory Visit: Payer: Self-pay

## 2020-02-09 ENCOUNTER — Encounter (HOSPITAL_COMMUNITY): Payer: Self-pay | Admitting: *Deleted

## 2020-02-09 DIAGNOSIS — K0889 Other specified disorders of teeth and supporting structures: Secondary | ICD-10-CM | POA: Diagnosis not present

## 2020-02-09 DIAGNOSIS — Z5321 Procedure and treatment not carried out due to patient leaving prior to being seen by health care provider: Secondary | ICD-10-CM | POA: Insufficient documentation

## 2020-02-09 NOTE — ED Triage Notes (Signed)
Toothache for one month no dentist

## 2020-02-12 ENCOUNTER — Other Ambulatory Visit: Payer: BC Managed Care – PPO

## 2020-02-12 DIAGNOSIS — Z20822 Contact with and (suspected) exposure to covid-19: Secondary | ICD-10-CM

## 2020-02-14 LAB — NOVEL CORONAVIRUS, NAA: SARS-CoV-2, NAA: NOT DETECTED

## 2020-02-14 LAB — SARS-COV-2, NAA 2 DAY TAT

## 2020-03-04 ENCOUNTER — Encounter: Payer: Self-pay | Admitting: Family Medicine

## 2020-03-05 ENCOUNTER — Telehealth (INDEPENDENT_AMBULATORY_CARE_PROVIDER_SITE_OTHER): Payer: BC Managed Care – PPO | Admitting: Family Medicine

## 2020-03-05 ENCOUNTER — Encounter: Payer: Self-pay | Admitting: Family Medicine

## 2020-03-05 ENCOUNTER — Other Ambulatory Visit: Payer: Self-pay

## 2020-03-05 VITALS — Ht 64.0 in | Wt 205.0 lb

## 2020-03-05 DIAGNOSIS — J020 Streptococcal pharyngitis: Secondary | ICD-10-CM

## 2020-03-05 LAB — POCT RAPID STREP A (OFFICE): Rapid Strep A Screen: POSITIVE — AB

## 2020-03-05 MED ORDER — AMOXICILLIN 875 MG PO TABS: 875.0000 mg | ORAL_TABLET | Freq: Two times a day (BID) | ORAL | 0 refills | Status: DC

## 2020-03-05 NOTE — Progress Notes (Signed)
Patient: Renee Knapp MRN: [id number] DOB: [birth year] PCP: [name], MD     I connected with Renee Knapp on 03/05/20 at 2:38pm  by a video enabled telemedicine application and verified that I am speaking with the correct person using two identifiers.  Location patient: Home Location provider: [facility], Office Persons participating in this virtual visit: Tasha Scaff and Dr. Artis Flock   I discussed the limitations of evaluation and management by telemedicine and the availability of in person appointments. The patient expressed understanding and agreed to proceed.   Interactive audio and video telecommunications were attempted between this provider and patient, however failed, due to patient having technical difficulties OR patient did not have access to video capability.  We continued and completed visit with audio only.   Subjective:  Chief Complaint  Patient presents with  . Sore Throat    HPI: The patient is a 44 y.o. female who presents today for sore throat. She felt nauseated and tired the last 2 days and woke up this AM with a sore throat that feels like it's burning. She doesn't have any pain with swallowing, just feels like she needs to clear her throat. No neck tenderness or enlarged lymph nodes. No fever/chills.  She has been around kids that have been sick, unsure if anyone had strep throat. She does have some ear pain bilaterally. She is eating and drinking okay.    Review of Systems  Constitutional: Negative for chills and fever.  HENT: Positive for ear pain and sore throat. Negative for congestion, sinus pressure, sinus pain and trouble swallowing.   Respiratory: Negative for cough and shortness of breath.   Gastrointestinal: Positive for nausea. Negative for abdominal pain, diarrhea and vomiting.  Neurological: Negative for dizziness and headaches.    Allergies Patient is allergic to bee venom and reglan [metoclopramide].  Past Medical History Patient   has a past medical history of Anemia, Diabetes mellitus without complication (HCC), Obesity, Plantar fasciitis, bilateral, and SVD (spontaneous vaginal delivery).  Surgical History Patient  has a past surgical history that includes Tubal ligation; Dilation and curettage of uterus; Wisdom tooth extraction; Laparoscopic assisted vaginal hysterectomy (N/A, 02/24/2014); and Cystoscopy (N/A, 02/24/2014).  Family History Pateint's family history includes Cancer in an other family member; Diabetes in her mother and another family member; Hearing loss in her son; Heart disease in her mother; High Cholesterol in her father; Hypertension in her father, mother, sister, sister, and another family member; Kidney disease in her sister; Stroke in her sister.  Social History Patient  reports that she has never smoked. She has never used smokeless tobacco. She reports that she does not drink alcohol and does not use drugs.    Objective: Vitals:   03/05/20 1333  Weight: 205 lb (93 kg)  Height: 5\' 4"  (1.626 m)    Body mass index is 35.19 kg/m.  Physical Exam Vitals reviewed.        Rapid strep: positive  Assessment/plan: 1. Strep throat Will treat with 10 day course of amoxicillin. Advised to throw away toothbrush after 24 hours and no school until fever free for 24 hours. Push fluids and anti-pyretic medication prn for fever. Precautions given for tonsillar abscess/worsening symptoms and to return or go to ER. Side effects of medication discussed and importance to completely full course of therapy.   - POCT rapid strep A  -discussed overdue for diabetic follow up. Please schedule this soon.   Return if symptoms worsen or fail to improve.  [name], MD [facility]  03/05/2020

## 2020-03-24 ENCOUNTER — Encounter: Payer: Self-pay | Admitting: Family Medicine

## 2020-03-25 MED ORDER — FLUOXETINE HCL 20 MG PO TABS: 20.0000 mg | ORAL_TABLET | Freq: Every day | ORAL | 3 refills | Status: DC

## 2020-03-29 ENCOUNTER — Other Ambulatory Visit: Payer: Self-pay | Admitting: Family Medicine

## 2020-04-20 ENCOUNTER — Ambulatory Visit (HOSPITAL_COMMUNITY)
Admission: EM | Admit: 2020-04-20 | Discharge: 2020-04-20 | Disposition: A | Discharge status: Home / Self Care | Payer: BC Managed Care – PPO | Attending: Internal Medicine | Admitting: Internal Medicine

## 2020-04-20 ENCOUNTER — Other Ambulatory Visit: Payer: Self-pay

## 2020-04-20 ENCOUNTER — Encounter (HOSPITAL_COMMUNITY): Payer: Self-pay | Admitting: *Deleted

## 2020-04-20 DIAGNOSIS — R0602 Shortness of breath: Secondary | ICD-10-CM | POA: Diagnosis not present

## 2020-04-20 DIAGNOSIS — J40 Bronchitis, not specified as acute or chronic: Secondary | ICD-10-CM | POA: Diagnosis not present

## 2020-04-20 DIAGNOSIS — R062 Wheezing: Secondary | ICD-10-CM | POA: Insufficient documentation

## 2020-04-20 DIAGNOSIS — Z20822 Contact with and (suspected) exposure to covid-19: Secondary | ICD-10-CM | POA: Diagnosis not present

## 2020-04-20 LAB — SARS CORONAVIRUS 2 (TAT 6-24 HRS): SARS Coronavirus 2: NEGATIVE

## 2020-04-20 MED ORDER — PREDNISONE 20 MG PO TABS: 40.0000 mg | ORAL_TABLET | Freq: Every day | ORAL | 0 refills | Status: DC

## 2020-04-20 MED ORDER — PROMETHAZINE-DM 6.25-15 MG/5ML PO SYRP: 5.0000 mL | ORAL_SOLUTION | Freq: Four times a day (QID) | ORAL | 0 refills | Status: DC | PRN

## 2020-04-20 NOTE — ED Provider Notes (Signed)
[facility]    CSN: [id number] Arrival date & time: 04/20/20  0801      History   Chief Complaint Chief Complaint  Patient presents with  . Cough  . Nasal Congestion    HPI Renee Knapp is a 44 y.o. female.   Patient presenting today with 5 day hx of productive cough, congestion, hoarseness, and now wheezing and occasional SOB. She denies fever, chills, CP, abdominal pain, N/V/D. Taking mucinex without benefit. No known sick contacts, no hx of pulmonary dz, smoking. UTD on vaccines.      Past Medical History:  Diagnosis Date  . Anemia    History  . Diabetes mellitus without complication (HCC)    diet and exercise controlled - no meds  . Obesity   . Plantar fasciitis, bilateral    History - recvd cortisone injections bilateral- no current prob as 01/2014  . SVD (spontaneous vaginal delivery)    x 2    Patient Active Problem List   Diagnosis Date Noted  . GAD (generalized anxiety disorder) 08/30/2018  . Vitamin D deficiency 01/18/2018  . Hyperlipidemia 11/25/2017  . Controlled type 2 diabetes mellitus without complication, without long-term current use of insulin (HCC) 11/10/2017  . Acute pericarditis 09/01/2015  . S/P laparoscopic assisted vaginal hysterectomy (LAVH) 02/24/2014    Past Surgical History:  Procedure Laterality Date  . CYSTOSCOPY N/A 02/24/2014   Procedure: CYSTOSCOPY;  Surgeon: Essie Hart, MD;  Location: [facility];  Service: Gynecology;  Laterality: N/A;  . DILATION AND CURETTAGE OF UTERUS     MAB  . LAPAROSCOPIC ASSISTED VAGINAL HYSTERECTOMY N/A 02/24/2014   Procedure: LAPAROSCOPIC ASSISTED VAGINAL HYSTERECTOMY WITH BILATERAL SALPINGECTOMY ;  Surgeon: Essie Hart, MD;  Location: [facility];  Service: Gynecology;  Laterality: N/A;  . TUBAL LIGATION    . WISDOM TOOTH EXTRACTION      OB History    Gravida  3   Para      Term      Preterm      AB  1   Living  2     SAB  1   TAB      Ectopic      Multiple      Live  Births               Home Medications    Prior to Admission medications   Medication Sig Start Date End Date Taking? Authorizing Advertising account planner lancets USE 1 SINGLE USE LANCETS AS NEEDED TO CHECK BLOOD SUGAR 04/24/19  Yes [name], MD  CONTOUR NEXT TEST test strip USE 1 LANCETS AS NEEDED TO CHECK BLOOD SUGAR 03/30/20  Yes [name], MD  FLUoxetine (PROZAC) 20 MG tablet Take 1 tablet (20 mg total) by mouth daily. 03/25/20  Yes [name], MD  metFORMIN (GLUCOPHAGE-XR) 500 MG 24 hr tablet Take 2 tablets (1,000 mg total) by mouth 2 (two) times daily. 12/04/19  Yes [name], MD  OZEMPIC, 0.25 OR 0.5 MG/DOSE, 2 MG/1.5ML SOPN INJECT 0.5 MG INTO THE SKIN ONCE A WEEK. 01/28/20  Yes [name], MD  rosuvastatin (CRESTOR) 10 MG tablet Take 1 tablet (10 mg total) by mouth daily. 09/12/19  Yes [name], MD  acetaminophen-codeine (TYLENOL #3) 300-30 MG tablet Take 1 tablet by mouth every 4 (four) hours as needed for moderate pain. 10/07/19   [name], MD  amoxicillin (AMOXIL) 875 MG tablet Take 1 tablet (875 mg total) by mouth 2 (two) times daily.  03/05/20   [name], MD  cholecalciferol (VITAMIN D3) 25 MCG (1000 UNIT) tablet Take 1,000 Units by mouth daily.    [provider]  EPINEPHrine (EPIPEN JR) 0.15 MG/0.3ML injection Inject 0.6 mLs (0.3 mg total) into the muscle once as needed for up to 1 dose for anaphylaxis (for allergic reaction). 02/15/19   [name], MD  ibuprofen (ADVIL) 800 MG tablet Take 800 mg by mouth every 8 (eight) hours as needed.    [provider]  predniSONE (DELTASONE) 20 MG tablet Take 2 tablets (40 mg total) by mouth daily with breakfast. 04/20/20   Particia Nearing, PA-C  promethazine-dextromethorphan (PROMETHAZINE-DM) 6.25-15 MG/5ML syrup Take 5 mLs by mouth 4 (four) times daily as needed for cough. 04/20/20   Particia Nearing, PA-C    Family History Family History  Problem Relation Age  of Onset  . Cancer Other   . Hypertension Other   . Diabetes Other   . Diabetes Mother   . Heart disease Mother   . Hypertension Mother   . Hypertension Father   . High Cholesterol Father   . Hypertension Sister   . Kidney disease Sister   . Stroke Sister   . Hypertension Sister   . Hearing loss Son     Social History Social History   Tobacco Use  . Smoking status: Never Smoker  . Smokeless tobacco: Never Used  Vaping Use  . Vaping Use: Never used  Substance Use Topics  . Alcohol use: No  . Drug use: No     Allergies   Bee venom and Reglan [metoclopramide]   Review of Systems Review of Systems PER HPI   Physical Exam Triage Vital Signs ED Triage Vitals  Enc Vitals Group     BP 04/20/20 0820 (!) 109/50     Pulse Rate 04/20/20 0820 90     Resp 04/20/20 0820 18     Temp 04/20/20 0820 98.2 F (36.8 C)     Temp Source 04/20/20 0820 Oral     SpO2 04/20/20 0820 100 %     Weight 04/20/20 0827 208 lb (94.3 kg)     Height 04/20/20 0827 5' (1.524 m)     Head Circumference --      Peak Flow --      Pain Score 04/20/20 0825 9     Pain Loc --      Pain Edu? --      Excl. in GC? --    No data found.  Updated Vital Signs BP (!) 109/50 (BP Location: Right Arm)   Pulse 90   Temp 98.2 F (36.8 C) (Oral)   Resp 18   Ht 5' (1.524 m)   Wt 208 lb (94.3 kg)   LMP 02/06/2014 (LMP Unknown)   SpO2 100%   BMI 40.62 kg/m   Visual Acuity Right Eye Distance:   Left Eye Distance:   Bilateral Distance:    Right Eye Near:   Left Eye Near:    Bilateral Near:     Physical Exam Vitals and nursing note reviewed.  Constitutional:      Appearance: Normal appearance. She is not ill-appearing.  HENT:     Head: Atraumatic.     Right Ear: Tympanic membrane normal.     Left Ear: Tympanic membrane normal.     Nose: Rhinorrhea present.     Mouth/Throat:     Mouth: Mucous membranes are moist.     Pharynx: Oropharynx is clear. Posterior oropharyngeal  erythema present. No  oropharyngeal exudate.  Eyes:     Extraocular Movements: Extraocular movements intact.     Conjunctiva/sclera: Conjunctivae normal.  Cardiovascular:     Rate and Rhythm: Normal rate and regular rhythm.     Heart sounds: Normal heart sounds.  Pulmonary:     Effort: Pulmonary effort is normal. No respiratory distress.     Breath sounds: Wheezing (scattered) present. No rales.  Abdominal:     General: Bowel sounds are normal. There is no distension.     Palpations: Abdomen is soft.     Tenderness: There is no abdominal tenderness. There is no guarding.  Musculoskeletal:        General: Normal range of motion.     Cervical back: Normal range of motion and neck supple.  Skin:    General: Skin is warm and dry.  Neurological:     Mental Status: She is alert and oriented to person, place, and time.  Psychiatric:        Mood and Affect: Mood normal.        Thought Content: Thought content normal.        Judgment: Judgment normal.      UC Treatments / Results  Labs (all labs ordered are listed, but only abnormal results are displayed) Labs Reviewed  SARS CORONAVIRUS 2 (TAT 6-24 HRS)    EKG   Radiology No results found.  Procedures Procedures (including critical care time)  Medications Ordered in UC Medications - No data to display  Initial Impression / Assessment and Plan / UC Course  I have reviewed the triage vital signs and the nursing notes.  Pertinent labs & imaging results that were available during my care of the patient were reviewed by me and considered in my medical decision making (see chart for details).     Afebrile, O2 saturation 100% and overall well appearing and in no distress today. She states she does not tolerate inhalers well and declines inhaled treatment today. Will cautiously give prednisone burst (educated on elevations of BSs with this medicine) and phenergan DM to treat sxs. COVID pcr pending, work note given, isolate until results back/feeling  better. Return precautions given.   Final Clinical Impressions(s) / UC Diagnoses   Final diagnoses:  Bronchitis   Discharge Instructions   None    ED Prescriptions    Medication Sig Dispense Auth. Provider   predniSONE (DELTASONE) 20 MG tablet Take 2 tablets (40 mg total) by mouth daily with breakfast. 10 tablet Particia Nearing, PA-C   promethazine-dextromethorphan (PROMETHAZINE-DM) 6.25-15 MG/5ML syrup Take 5 mLs by mouth 4 (four) times daily as needed for cough. 100 mL Particia Nearing, [place]     PDMP not reviewed this encounter.   Particia Nearing, [place] 04/20/20 0900

## 2020-04-20 NOTE — ED Triage Notes (Signed)
PT reports cough and congestion started last Thursday. Pt denies fever . She has also been using OTC with out relief.

## 2020-04-27 ENCOUNTER — Other Ambulatory Visit: Payer: Self-pay | Admitting: Family Medicine

## 2020-05-13 ENCOUNTER — Encounter: Payer: Self-pay | Admitting: Family Medicine

## 2020-05-13 ENCOUNTER — Other Ambulatory Visit: Payer: Self-pay

## 2020-05-13 ENCOUNTER — Ambulatory Visit: Payer: BC Managed Care – PPO | Admitting: Family Medicine

## 2020-05-13 VITALS — BP 115/76 | HR 93 | Temp 98.4°F | Ht 60.0 in | Wt 209.2 lb

## 2020-05-13 DIAGNOSIS — E559 Vitamin D deficiency, unspecified: Secondary | ICD-10-CM

## 2020-05-13 DIAGNOSIS — F411 Generalized anxiety disorder: Secondary | ICD-10-CM

## 2020-05-13 DIAGNOSIS — E782 Mixed hyperlipidemia: Secondary | ICD-10-CM

## 2020-05-13 DIAGNOSIS — E119 Type 2 diabetes mellitus without complications: Secondary | ICD-10-CM | POA: Diagnosis not present

## 2020-05-13 DIAGNOSIS — J40 Bronchitis, not specified as acute or chronic: Secondary | ICD-10-CM

## 2020-05-13 DIAGNOSIS — Z1159 Encounter for screening for other viral diseases: Secondary | ICD-10-CM

## 2020-05-13 LAB — POCT GLYCOSYLATED HEMOGLOBIN (HGB A1C): Hemoglobin A1C: 6.7 % — AB (ref 4.0–5.6)

## 2020-05-13 MED ORDER — EPINEPHRINE 0.15 MG/0.3ML IJ SOAJ: 0.3000 mg | Freq: Once | INTRAMUSCULAR | 1 refills | Status: DC | PRN

## 2020-05-13 MED ORDER — BENZONATATE 200 MG PO CAPS: 200.0000 mg | ORAL_CAPSULE | Freq: Two times a day (BID) | ORAL | 1 refills | Status: DC | PRN

## 2020-05-13 MED ORDER — GUAIFENESIN-CODEINE 100-10 MG/5ML PO SOLN: 10.0000 mL | Freq: Four times a day (QID) | ORAL | 0 refills | Status: DC | PRN

## 2020-05-13 NOTE — Progress Notes (Signed)
Patient: Renee Knapp MRN: [id number] DOB: [birth year] PCP: [name], MD     Subjective:  Chief Complaint  Patient presents with  . Diabetes  . anxiety  . Bronchitis    HPI: The patient is a 44 y.o. female who presents today for DM. Pt complains of continuous cough due to Bronchitis.   Diabetes: Patient is here for follow up of type 2 diabetes. Currently on the following medications metformin 1000mg  Bid and ozempic weekly. Takes medications as prescribed. Last A1C was 6.7 today. Currently exercising and following diabetic diet.  Denies any hypoglycemic events. Denies any vision changes, nausea, vomiting, abdominal pain, ulcers/paraesthesia in feet, polyuria, polydipsia or polyphagia. Denies any chest pain, shortness of breath. Does not need refills of medication.    Anxiety Currently on prozac 20mg day. Her anxiety seems to be well controlled. She really likes her medication and takes as prescribed. She also has been crafting which helps her a lot as well.   Bronchitis Seen in ER on 04/20/20. Diagnosed with bronchitis. Given steroids, phenergan. covid test negative. She doesn't feel like these medications helped her at all except helping her sleep. She is still coughing. No shortness of breath, but she does hear some wheezing. Cough not worse during any part of the day.   Has had her covid booster and her flu shot.   Review of Systems  Constitutional: Negative for chills, fatigue and fever.  HENT: Negative for dental problem, ear pain, hearing loss and trouble swallowing.   Eyes: Negative for visual disturbance.  Respiratory: Positive for cough and wheezing. Negative for chest tightness and shortness of breath.   Cardiovascular: Negative for chest pain, palpitations and leg swelling.  Gastrointestinal: Negative for abdominal pain, blood in stool, diarrhea and nausea.  Endocrine: Negative for cold intolerance, polydipsia, polyphagia and polyuria.  Genitourinary: Negative for  dysuria and hematuria.  Musculoskeletal: Negative for arthralgias.  Skin: Negative for rash.  Neurological: Negative for dizziness and headaches.  Psychiatric/Behavioral: Negative for dysphoric mood and sleep disturbance. The patient is not nervous/anxious.     Allergies Patient is allergic to bee venom and reglan [metoclopramide].  Past Medical History Patient  has a past medical history of Anemia, Diabetes mellitus without complication (HCC), Obesity, Plantar fasciitis, bilateral, and SVD (spontaneous vaginal delivery).  Surgical History Patient  has a past surgical history that includes Tubal ligation; Dilation and curettage of uterus; Wisdom tooth extraction; Laparoscopic assisted vaginal hysterectomy (N/A, 02/24/2014); and Cystoscopy (N/A, 02/24/2014).  Family History Pateint's family history includes Cancer in an other family member; Diabetes in her mother and another family member; Hearing loss in her son; Heart disease in her mother; High Cholesterol in her father; Hypertension in her father, mother, sister, sister, and another family member; Kidney disease in her sister; Stroke in her sister.  Social History Patient  reports that she has never smoked. She has never used smokeless tobacco. She reports that she does not drink alcohol and does not use drugs.    Objective: Vitals:   05/13/20 1006  BP: 115/76  Pulse: 93  Temp: 98.4 F (36.9 C)  TempSrc: Temporal  SpO2: 98%  Weight: 209 lb 3.2 oz (94.9 kg)  Height: 5' (1.524 m)    Body mass index is 40.86 kg/m.  Physical Exam Vitals reviewed.  Constitutional:      Appearance: Normal appearance. She is well-developed and well-nourished. She is obese.  HENT:     Head: Normocephalic and atraumatic.     Right Ear: Tympanic membrane, ear  canal and external ear normal.     Left Ear: Tympanic membrane, ear canal and external ear normal.     Mouth/Throat:     Mouth: Oropharynx is clear and moist. Mucous membranes are moist.   Eyes:     Extraocular Movements: EOM normal.     Conjunctiva/sclera: Conjunctivae normal.     Pupils: Pupils are equal, round, and reactive to light.  Neck:     Thyroid: No thyromegaly.  Cardiovascular:     Rate and Rhythm: Normal rate and regular rhythm.     Pulses: Normal pulses and intact distal pulses.     Heart sounds: Normal heart sounds. No murmur heard.   Pulmonary:     Effort: Pulmonary effort is normal.     Breath sounds: Normal breath sounds.  Abdominal:     General: Bowel sounds are normal. There is no distension.     Palpations: Abdomen is soft.     Tenderness: There is no abdominal tenderness.  Musculoskeletal:     Cervical back: Normal range of motion and neck supple.  Lymphadenopathy:     Cervical: No cervical adenopathy.  Skin:    General: Skin is warm and dry.     Capillary Refill: Capillary refill takes less than 2 seconds.     Findings: No rash.  Neurological:     General: No focal deficit present.     Mental Status: She is alert and oriented to person, place, and time.     Cranial Nerves: No cranial nerve deficit.     Coordination: Coordination normal.     Deep Tendon Reflexes: Reflexes normal.  Psychiatric:        Mood and Affect: Mood and affect and mood normal.        Behavior: Behavior normal.         GAD 7 : Generalized Anxiety Score 05/13/2020 08/16/2019 04/24/2019 02/15/2019  Nervous, Anxious, on Edge 0 0 3 0  Control/stop worrying 0 0 1 0  Worry too much - different things 1 0 3 0  Trouble relaxing 1 0 1 0  Restless 0 0 3 0  Easily annoyed or irritable 0 0 0 0  Afraid - awful might happen 0 1 3 0  Total GAD 7 Score 2 1 14  0  Anxiety Difficulty Not difficult at all Not difficult at all Not difficult at all Not difficult at all     Assessment/plan: 1. Controlled type 2 diabetes mellitus without complication, without long-term current use of insulin (HCC) -a1c near to goal. Doing well. Continue metformin/ozempic.  -on statin -foot  exam next visit -utd on immunizations.  -really encouraged exercise.  -f/u in 6 months.  - POCT HgB A1C - CBC with Differential/Platelet; Future - TSH; Future - COMPLETE METABOLIC PANEL WITH GFR; Future - Microalbumin / creatinine urine ratio; Future  2. GAD (generalized anxiety disorder) GAD7 score is very well controlled. Doing well on prozac 20mg /day. No refills needed.   3. Mixed hyperlipidemia Continue crestor, no refills needed.  - Lipid panel; Future  4. Vitamin D deficiency On daily vitamin D. Continue this.  - VITAMIN D 25 Hydroxy (Vit-D Deficiency, Fractures); Future  5. Encounter for hepatitis C screening test for low risk patient  - Hepatitis C antibody  6. Bronchitis Conservative therapy with cool mist humidifier at night, rest/fluids, and honey daily. Recommend 1 tablespoon/day. Also recommended over the counter anti tussive medication: robitussin DM during the day and will do a codeine cough syrup at night. Can  also use during day since not working. No longer using the promethazine cough syrup at night. Sending in tessalon pearls prn for cough. Discussed viral in nature and no indication for antibiotics at this point. Precautions given for worsening symptoms, fever, shortness of breath to let us know immediatly. Discussed to let me know if still coughing after 6 weeks.    This visit occurred during the SARS-CoV-2 public health emergency.  Safety protocols were in place, including screening questions prior to the visit, additional usage of staff PPE, and extensive cleaning of exam room while observing appropriate contact time as indicated for disinfecting solutions.     Return in about 6 months (around 11/11/2020) for routine diabetes/anxiety f/u .   [name], MD [facility]   05/13/2020

## 2020-05-13 NOTE — Telephone Encounter (Signed)
Addressed in office visit today.

## 2020-05-13 NOTE — Patient Instructions (Signed)
-  refilled epi pen  -routine labs today  -for bronchitis. Lung exam sounds great. Add on cool mist humidifier at night, honey daily and I like robitussin DM during the day. Sent in cough syrup with codeine you can take every 6 hours as needed, but may make you drowsy. Also sent in tessalon pearls you can take twice a day for cough. This will take time, up to 6 weeks. If still coughing after 6 weeks please email me  So I can order xray. Would also add on flonase as well.   a1c is 6.7! Great job.    Love seeing you and [employer]. Praying for you and your family with all of your loss,  Dr. Artis Flock

## 2020-07-20 ENCOUNTER — Ambulatory Visit
Admission: EM | Admit: 2020-07-20 | Discharge: 2020-07-20 | Disposition: A | Discharge status: Home / Self Care | Payer: BC Managed Care – PPO | Attending: Emergency Medicine | Admitting: Emergency Medicine

## 2020-07-20 ENCOUNTER — Other Ambulatory Visit: Payer: Self-pay

## 2020-07-20 DIAGNOSIS — H1013 Acute atopic conjunctivitis, bilateral: Secondary | ICD-10-CM | POA: Diagnosis not present

## 2020-07-20 DIAGNOSIS — J069 Acute upper respiratory infection, unspecified: Secondary | ICD-10-CM | POA: Diagnosis not present

## 2020-07-20 LAB — POCT RAPID STREP A (OFFICE): Rapid Strep A Screen: NEGATIVE

## 2020-07-20 MED ORDER — CETIRIZINE HCL 10 MG PO CAPS: 10.0000 mg | ORAL_CAPSULE | Freq: Every day | ORAL | 0 refills | Status: DC

## 2020-07-20 MED ORDER — PREDNISONE 20 MG PO TABS: 40.0000 mg | ORAL_TABLET | Freq: Every day | ORAL | 0 refills | Status: AC

## 2020-07-20 MED ORDER — PROMETHAZINE-DM 6.25-15 MG/5ML PO SYRP: 5.0000 mL | ORAL_SOLUTION | Freq: Every evening | ORAL | 0 refills | Status: DC | PRN

## 2020-07-20 MED ORDER — BENZONATATE 200 MG PO CAPS: 200.0000 mg | ORAL_CAPSULE | Freq: Three times a day (TID) | ORAL | 0 refills | Status: AC | PRN

## 2020-07-20 MED ORDER — OLOPATADINE HCL 0.1 % OP SOLN: 1.0000 [drp] | Freq: Two times a day (BID) | OPHTHALMIC | 12 refills | Status: DC

## 2020-07-20 NOTE — Discharge Instructions (Signed)
Strep test negative Patient daily cetirizine help with congestion and drainage Prednisone daily for the next 5 days Tessalon for daytime cough Promethazine DM at bedtime for nighttime cough Honey and hot tea Follow-up if not improving or worsening

## 2020-07-20 NOTE — ED Provider Notes (Signed)
[facility]    CSN: [id number] Arrival date & time: 07/20/20  1128      History   Chief Complaint Chief Complaint  Patient presents with  . Wheezing  . Eye Problem  . Sore Throat    HPI Renee Knapp is a 45 y.o. female history of DM type II presenting today for evaluation of URI symptoms and eye irritation.  Patient reports over the past 2 to 3 days she has had congestion cough and sore throat.  Reports sore throat similar to when she has had strep previously.  Also reports wheezing and recurrent bronchitis.  Using over-the-counter medicines without relief.  Reports negative rapid Covid test at site prior to arrival today.  She denies any fevers, nausea vomiting diarrhea or abdominal pain.  Woke up today with having some eye burning and crusting, light sensitivity.  Denies injury or trauma.  Denies contact use, does wear glasses. Denies changes in vision.  HPI  Past Medical History:  Diagnosis Date  . Anemia    History  . Diabetes mellitus without complication (HCC)    diet and exercise controlled - no meds  . Obesity   . Plantar fasciitis, bilateral    History - recvd cortisone injections bilateral- no current prob as 01/2014  . SVD (spontaneous vaginal delivery)    x 2    Patient Active Problem List   Diagnosis Date Noted  . GAD (generalized anxiety disorder) 08/30/2018  . Vitamin D deficiency 01/18/2018  . Hyperlipidemia 11/25/2017  . Controlled type 2 diabetes mellitus without complication, without long-term current use of insulin (HCC) 11/10/2017  . Acute pericarditis 09/01/2015  . S/P laparoscopic assisted vaginal hysterectomy (LAVH) 02/24/2014    Past Surgical History:  Procedure Laterality Date  . CYSTOSCOPY N/A 02/24/2014   Procedure: CYSTOSCOPY;  Surgeon: Essie Hart, MD;  Location: [facility];  Service: Gynecology;  Laterality: N/A;  . DILATION AND CURETTAGE OF UTERUS     MAB  . LAPAROSCOPIC ASSISTED VAGINAL HYSTERECTOMY N/A 02/24/2014    Procedure: LAPAROSCOPIC ASSISTED VAGINAL HYSTERECTOMY WITH BILATERAL SALPINGECTOMY ;  Surgeon: Essie Hart, MD;  Location: [facility];  Service: Gynecology;  Laterality: N/A;  . TUBAL LIGATION    . WISDOM TOOTH EXTRACTION      OB History    Gravida  3   Para      Term      Preterm      AB  1   Living  2     SAB  1   IAB      Ectopic      Multiple      Live Births               Home Medications    Prior to Admission medications   Medication Sig Start Date End Date Taking? Authorizing Provider  benzonatate (TESSALON) 200 MG capsule Take 1 capsule (200 mg total) by mouth 3 (three) times daily as needed for up to 7 days for cough. 07/20/20 07/27/20 Yes Le Ferraz C, PA-C  Cetirizine HCl 10 MG CAPS Take 1 capsule (10 mg total) by mouth daily for 10 days. 07/20/20 07/30/20 Yes Fergus Throne C, PA-C  olopatadine (PATANOL) 0.1 % ophthalmic solution Place 1 drop into both eyes 2 (two) times daily. 07/20/20  Yes Quay Simkin C, PA-C  predniSONE (DELTASONE) 20 MG tablet Take 2 tablets (40 mg total) by mouth daily with breakfast for 5 days. 07/20/20 07/25/20 Yes Darrius Montano C, PA-C  promethazine-dextromethorphan (PROMETHAZINE-DM)  6.25-15 MG/5ML syrup Take 5 mLs by mouth at bedtime as needed for cough. 07/20/20  Yes Jessyca Sloan, [place] C, PA-C  Bayer Microlet Lancets lancets USE 1 SINGLE USE LANCETS AS NEEDED TO CHECK BLOOD SUGAR 04/24/19   [name], MD  cholecalciferol (VITAMIN D3) 25 MCG (1000 UNIT) tablet Take 1,000 Units by mouth daily.    [provider]  CONTOUR NEXT TEST test strip USE 1 LANCETS AS NEEDED TO CHECK BLOOD SUGAR 03/30/20   [name], MD  EPINEPHrine (EPIPEN JR) 0.15 MG/0.3ML injection Inject 0.3 mg into the muscle once as needed for up to 1 dose for anaphylaxis (for allergic reaction). 05/13/20   [name], MD  FLUoxetine (PROZAC) 20 MG tablet TAKE 1 TABLET BY MOUTH EVERY DAY 04/27/20   [name], MD  metFORMIN (GLUCOPHAGE-XR) 500 MG 24  hr tablet Take 2 tablets (1,000 mg total) by mouth 2 (two) times daily. 12/04/19   [name], MD  OZEMPIC, 0.25 OR 0.5 MG/DOSE, 2 MG/1.5ML SOPN INJECT 0.5 MG INTO THE SKIN ONCE A WEEK. 01/28/20   [name], MD  rosuvastatin (CRESTOR) 10 MG tablet Take 1 tablet (10 mg total) by mouth daily. 09/12/19   [name], MD    Family History Family History  Problem Relation Age of Onset  . Cancer Other   . Hypertension Other   . Diabetes Other   . Diabetes Mother   . Heart disease Mother   . Hypertension Mother   . Hypertension Father   . High Cholesterol Father   . Hypertension Sister   . Kidney disease Sister   . Stroke Sister   . Hypertension Sister   . Hearing loss Son     Social History Social History   Tobacco Use  . Smoking status: Never Smoker  . Smokeless tobacco: Never Used  Vaping Use  . Vaping Use: Never used  Substance Use Topics  . Alcohol use: No  . Drug use: No     Allergies   Bee venom and Reglan [metoclopramide]   Review of Systems Review of Systems  Constitutional: Negative for activity change, appetite change, chills, fatigue and fever.  HENT: Positive for congestion, rhinorrhea, sinus pressure and sore throat. Negative for ear pain and trouble swallowing.   Eyes: Positive for photophobia, discharge and itching. Negative for redness.  Respiratory: Positive for cough. Negative for chest tightness and shortness of breath.   Cardiovascular: Negative for chest pain.  Gastrointestinal: Negative for abdominal pain, diarrhea, nausea and vomiting.  Musculoskeletal: Negative for myalgias.  Skin: Negative for rash.  Neurological: Negative for dizziness, light-headedness and headaches.     Physical Exam Triage Vital Signs ED Triage Vitals [07/20/20 1306]  Enc Vitals Group     BP 129/88     Pulse Rate 92     Resp 16     Temp 98.3 F (36.8 C)     Temp Source Oral     SpO2 98 %     Weight      Height      Head Circumference      Peak Flow       Pain Score      Pain Loc      Pain Edu?      Excl. in GC?    No data found.  Updated Vital Signs BP 129/88 (BP Location: Right Arm)   Pulse 92   Temp 98.3 F (36.8 C) (Oral)   Resp 16   LMP 02/06/2014 (LMP Unknown)  SpO2 98%   Visual Acuity Right Eye Distance:   Left Eye Distance:   Bilateral Distance:    Right Eye Near:   Left Eye Near:    Bilateral Near:     Physical Exam Vitals and nursing note reviewed.  Constitutional:      Appearance: She is well-developed and well-nourished.     Comments: No acute distress  HENT:     Head: Normocephalic and atraumatic.     Ears:     Comments: Bilateral ears without tenderness to palpation of external auricle, tragus and mastoid, EAC's without erythema or swelling, TM's with good bony landmarks and cone of light. Non erythematous.     Nose: Nose normal.     Mouth/Throat:     Comments: Oral mucosa pink and moist, no tonsillar enlargement or exudate. Posterior pharynx patent and nonerythematous, no uvula deviation or swelling. Normal phonation. Eyes:     Extraocular Movements: Extraocular movements intact.     Conjunctiva/sclera: Conjunctivae normal.     Pupils: Pupils are equal, round, and reactive to light.     Comments: Bilateral conjunctiva without any significant erythema, no drainage or discharge noted, anterior chamber clear, no photophobia with exam bilaterally  Cardiovascular:     Rate and Rhythm: Normal rate.  Pulmonary:     Effort: Pulmonary effort is normal. No respiratory distress.     Comments: Breathing comfortably at rest, CTABL, no wheezing, rales or other adventitious sounds auscultated Abdominal:     General: There is no distension.  Musculoskeletal:        General: Normal range of motion.     Cervical back: Neck supple.  Skin:    General: Skin is warm and dry.  Neurological:     Mental Status: She is alert and oriented to person, place, and time.  Psychiatric:        Mood and Affect: Mood and  affect normal.      UC Treatments / Results  Labs (all labs ordered are listed, but only abnormal results are displayed) Labs Reviewed  CULTURE, GROUP A STREP [facility])  POCT RAPID STREP A (OFFICE)    EKG   Radiology No results found.  Procedures Procedures (including critical care time)  Medications Ordered in UC Medications - No data to display  Initial Impression / Assessment and Plan / UC Course  I have reviewed the triage vital signs and the nursing notes.  Pertinent labs & imaging results that were available during my care of the patient were reviewed by me and considered in my medical decision making (see chart for details).     Strep test negative, prior Covid negative, treating viral URI with cough symptomatically and supportively, initiating on prednisone, Tessalon for daytime cough, Phenergan DM at bedtime, olopatadine twice daily to help with eye burning, suspect likely allergic conjunctivitis, does not appear bacterial or infectious at this time.  Discussed strict return precautions. Patient verbalized understanding and is agreeable with plan.  Final Clinical Impressions(s) / UC Diagnoses   Final diagnoses:  Viral URI with cough     Discharge Instructions     Strep test negative Patient daily cetirizine help with congestion and drainage Prednisone daily for the next 5 days Tessalon for daytime cough Promethazine DM at bedtime for nighttime cough Honey and hot tea Follow-up if not improving or worsening    ED Prescriptions    Medication Sig Dispense Auth. Provider   predniSONE (DELTASONE) 20 MG tablet Take 2 tablets (40 mg total) by mouth  daily with breakfast for 5 days. 10 tablet Eliezer Khawaja C, PA-C   benzonatate (TESSALON) 200 MG capsule Take 1 capsule (200 mg total) by mouth 3 (three) times daily as needed for up to 7 days for cough. 28 capsule Carel Carrier C, PA-C   Cetirizine HCl 10 MG CAPS Take 1 capsule (10 mg total) by mouth daily for  10 days. 10 capsule Lorina Duffner C, PA-C   promethazine-dextromethorphan (PROMETHAZINE-DM) 6.25-15 MG/5ML syrup Take 5 mLs by mouth at bedtime as needed for cough. 118 mL Zephyr Ridley C, PA-C   olopatadine (PATANOL) 0.1 % ophthalmic solution Place 1 drop into both eyes 2 (two) times daily. 5 mL Solene Hereford, [place] C, PA-C     PDMP not reviewed this encounter.   Lew Dawes, [place] 07/20/20 1523

## 2020-07-20 NOTE — ED Triage Notes (Signed)
Patient presents to Urgent Care with complaints of eye irritation and sore throat on Friday. Pt states she has been exp wheezing r/t to bronchitis  since Nov. She was prescribed a zpak that has provided some relief. She reports she unable to tolerate albuterol inhaler. Pt had a negative rapid covid test today at a testing site and result was negative.   Denies fever, vomiting, diarrhea, or abdominal pain.

## 2020-07-23 LAB — CULTURE, GROUP A STREP (THRC)

## 2020-08-14 DIAGNOSIS — Z1231 Encounter for screening mammogram for malignant neoplasm of breast: Secondary | ICD-10-CM | POA: Diagnosis not present

## 2020-09-30 DIAGNOSIS — Z20822 Contact with and (suspected) exposure to covid-19: Secondary | ICD-10-CM | POA: Diagnosis not present

## 2020-09-30 DIAGNOSIS — R110 Nausea: Secondary | ICD-10-CM | POA: Diagnosis not present

## 2020-10-04 ENCOUNTER — Other Ambulatory Visit: Payer: Self-pay

## 2020-10-04 ENCOUNTER — Emergency Department (HOSPITAL_COMMUNITY)
Admission: EM | Admit: 2020-10-04 | Discharge: 2020-10-05 | Disposition: A | Discharge status: Home / Self Care | Payer: BC Managed Care – PPO | Attending: Emergency Medicine | Admitting: Emergency Medicine

## 2020-10-04 DIAGNOSIS — Z79899 Other long term (current) drug therapy: Secondary | ICD-10-CM | POA: Insufficient documentation

## 2020-10-04 DIAGNOSIS — E119 Type 2 diabetes mellitus without complications: Secondary | ICD-10-CM | POA: Insufficient documentation

## 2020-10-04 DIAGNOSIS — N390 Urinary tract infection, site not specified: Secondary | ICD-10-CM | POA: Insufficient documentation

## 2020-10-04 DIAGNOSIS — X58XXXA Exposure to other specified factors, initial encounter: Secondary | ICD-10-CM | POA: Insufficient documentation

## 2020-10-04 DIAGNOSIS — M25551 Pain in right hip: Secondary | ICD-10-CM | POA: Diagnosis not present

## 2020-10-04 DIAGNOSIS — S59911A Unspecified injury of right forearm, initial encounter: Secondary | ICD-10-CM | POA: Diagnosis not present

## 2020-10-04 DIAGNOSIS — R9431 Abnormal electrocardiogram [ECG] [EKG]: Secondary | ICD-10-CM | POA: Diagnosis not present

## 2020-10-04 DIAGNOSIS — S56911A Strain of unspecified muscles, fascia and tendons at forearm level, right arm, initial encounter: Secondary | ICD-10-CM | POA: Insufficient documentation

## 2020-10-04 LAB — URINALYSIS, ROUTINE W REFLEX MICROSCOPIC
Bacteria, UA: NONE SEEN
Bilirubin Urine: NEGATIVE
Glucose, UA: NEGATIVE mg/dL
Ketones, ur: NEGATIVE mg/dL
Nitrite: NEGATIVE
Protein, ur: NEGATIVE mg/dL
Specific Gravity, Urine: 1.012 (ref 1.005–1.030)
WBC, UA: >50 WBC/hpf — ABNORMAL HIGH (ref 0–5)
pH: 8 (ref 5.0–8.0)

## 2020-10-04 LAB — CBC
HCT: 42.5 % (ref 36.0–46.0)
Hemoglobin: 13.6 g/dL (ref 12.0–15.0)
MCH: 27.2 pg (ref 26.0–34.0)
MCHC: 32 g/dL (ref 30.0–36.0)
MCV: 85 fL (ref 80.0–100.0)
Platelets: 352 10*3/uL (ref 150–400)
RBC: 5 MIL/uL (ref 3.87–5.11)
RDW: 12.9 % (ref 11.5–15.5)
WBC: 9.2 10*3/uL (ref 4.0–10.5)
nRBC: 0 % (ref 0.0–0.2)

## 2020-10-04 LAB — BASIC METABOLIC PANEL
Anion gap: 8 (ref 5–15)
BUN: 6 mg/dL (ref 6–20)
CO2: 31 mmol/L (ref 22–32)
Calcium: 9.3 mg/dL (ref 8.9–10.3)
Chloride: 103 mmol/L (ref 98–111)
Creatinine, Ser: 0.87 mg/dL (ref 0.44–1.00)
GFR, Estimated: >60 mL/min (ref >60)
Glucose, Bld: 99 mg/dL (ref 70–99)
Potassium: 3.6 mmol/L (ref 3.5–5.1)
Sodium: 142 mmol/L (ref 135–145)

## 2020-10-04 NOTE — ED Triage Notes (Signed)
Patient reports R arm pain x several days, also with R hip and leg tingling and pain, was seen at urgent care and was tested for COVID, was given nausea medicine and told to start taking zinc.

## 2020-10-05 LAB — CBG MONITORING, ED: Glucose-Capillary: 117 mg/dL — ABNORMAL HIGH (ref 70–99)

## 2020-10-05 MED ORDER — ACETAMINOPHEN 500 MG PO TABS
1000.0000 mg | ORAL_TABLET | Freq: Once | ORAL | Status: AC
  Administered 2020-10-05: 1000 mg via ORAL
  Filled 2020-10-05: qty 2

## 2020-10-05 MED ORDER — CEPHALEXIN 250 MG PO CAPS
500.0000 mg | ORAL_CAPSULE | Freq: Once | ORAL | Status: AC
  Administered 2020-10-05: 500 mg via ORAL
  Filled 2020-10-05: qty 2

## 2020-10-05 MED ORDER — CEPHALEXIN 500 MG PO CAPS: 500.0000 mg | ORAL_CAPSULE | Freq: Three times a day (TID) | ORAL | 0 refills | Status: AC

## 2020-10-05 NOTE — Progress Notes (Signed)
Orthopedic Tech Progress Note Patient Details:  Renee Knapp [birth year] [id number]  Ortho Devices Type of Ortho Device: Velcro wrist splint Ortho Device/Splint Location: RUE Ortho Device/Splint Interventions: Application,Ordered   Post Interventions Patient Tolerated: Well Instructions Provided: Care of device   [name] 10/05/2020, 7:53 AM

## 2020-10-05 NOTE — Discharge Instructions (Signed)
You may use over-the-counter Motrin (Ibuprofen), Acetaminophen (Tylenol), topical muscle creams such as SalonPas, Icy Hot, Bengay, etc. Please stretch, apply ice or heat (whichever helps), and have massage therapy for additional assistance.  

## 2020-10-05 NOTE — ED Notes (Signed)
Patient verbalizes understanding of discharge instructions. Opportunity for questioning and answers were provided. Armband removed by staff, pt discharged from ED and ambulated to lobby to return home.   

## 2020-10-05 NOTE — ED Notes (Addendum)
Patient states she is having worsening tingling to arm and fingers, neuro remains intact

## 2020-10-05 NOTE — ED Provider Notes (Signed)
[facility] EMERGENCY DEPARTMENT Provider Note  CSN: [id number] Arrival date & time: 10/04/20 1919  Chief Complaint(s) Arm Pain, Numbness, and Leg Pain  HPI Renee Knapp is a 45 y.o. female  Here for right arm and right hip pain for 2 days. Patient reports that while at work she has been using a can opener regularly. She has pain to the right wrist and right forearm which is exacerbated with raising her right wrist and palpating the forearm.  Alleviated by mobility.  She denies any falls or traumas.  Endorses paresthesias to the area.  No weakness.  No chest pain or shortness of breath. Patient also reports she has right hip pain noted when it happens ago. Aching.  Worse with ambulation and ranging of the right hip.  Again she denied any falls or trauma.  No lower back pain.  No bladder/bowel incontinence. Patient is endorsing frequency without dysuria  Patient denies any other physical complaints.  HPI  Past Medical History Past Medical History:  Diagnosis Date  . Anemia    History  . Diabetes mellitus without complication (HCC)    diet and exercise controlled - no meds  . Obesity   . Plantar fasciitis, bilateral    History - recvd cortisone injections bilateral- no current prob as 01/2014  . SVD (spontaneous vaginal delivery)    x 2   Patient Active Problem List   Diagnosis Date Noted  . GAD (generalized anxiety disorder) 08/30/2018  . Vitamin D deficiency 01/18/2018  . Hyperlipidemia 11/25/2017  . Controlled type 2 diabetes mellitus without complication, without long-term current use of insulin (HCC) 11/10/2017  . Acute pericarditis 09/01/2015  . S/P laparoscopic assisted vaginal hysterectomy (LAVH) 02/24/2014   Home Medication(s) Prior to Admission medications   Medication Sig Start Date End Date Taking? Authorizing Advertising account planner lancets USE 1 SINGLE USE LANCETS AS NEEDED TO CHECK BLOOD SUGAR 04/24/19  Yes [name], MD   cephALEXin (KEFLEX) 500 MG capsule Take 1 capsule (500 mg total) by mouth 3 (three) times daily for 7 days. 10/05/20 10/12/20 Yes Lazara Grieser, Amadeo Garnet, MD  CONTOUR NEXT TEST test strip USE 1 LANCETS AS NEEDED TO CHECK BLOOD SUGAR 03/30/20  Yes [name], MD  EPINEPHrine [facility] JR) 0.15 MG/0.3ML injection Inject 0.3 mg into the muscle once as needed for up to 1 dose for anaphylaxis (for allergic reaction). 05/13/20  Yes [name], MD  FLUoxetine (PROZAC) 20 MG tablet TAKE 1 TABLET BY MOUTH EVERY DAY Patient taking differently: Take 20 mg by mouth daily as needed (anxiety). 04/27/20  Yes [name], MD  ondansetron (ZOFRAN-ODT) 4 MG disintegrating tablet Take 4 mg by mouth every 8 (eight) hours as needed. 09/30/20  Yes [provider]  OZEMPIC, 0.25 OR 0.5 MG/DOSE, 2 MG/1.5ML SOPN INJECT 0.5 MG INTO THE SKIN ONCE A WEEK. Patient taking differently: Inject 0.5 mg into the skin every Sunday. 01/28/20  Yes [name], MD  Vitamin D, Cholecalciferol, 25 MCG (1000 UT) TABS Take 1,000 Units by mouth daily. gummy   Yes [provider]  Cetirizine HCl 10 MG CAPS Take 1 capsule (10 mg total) by mouth daily for 10 days. Patient not taking: Reported on 10/05/2020 07/20/20 07/30/20  Wieters, Fran [place]es C, PA-C  metFORMIN (GLUCOPHAGE-XR) 500 MG 24 hr tablet Take 2 tablets (1,000 mg total) by mouth 2 (two) times daily. Patient not taking: Reported on 10/05/2020 12/04/19   [name], MD  promethazine-dextromethorphan (PROMETHAZINE-DM) 6.25-15 MG/5ML syrup Take  5 mLs by mouth at bedtime as needed for cough. Patient not taking: Reported on 10/05/2020 07/20/20   Wieters, Hallie C, PA-C  rosuvastatin (CRESTOR) 10 MG tablet Take 1 tablet (10 mg total) by mouth daily. Patient not taking: Reported on 10/05/2020 09/12/19   [name], MD                                                                                                                                    Past Surgical History Past  Surgical History:  Procedure Laterality Date  . CYSTOSCOPY N/A 02/24/2014   Procedure: CYSTOSCOPY;  Surgeon: Essie Hart, MD;  Location: [facility];  Service: Gynecology;  Laterality: N/A;  . DILATION AND CURETTAGE OF UTERUS     MAB  . LAPAROSCOPIC ASSISTED VAGINAL HYSTERECTOMY N/A 02/24/2014   Procedure: LAPAROSCOPIC ASSISTED VAGINAL HYSTERECTOMY WITH BILATERAL SALPINGECTOMY ;  Surgeon: Essie Hart, MD;  Location: [facility];  Service: Gynecology;  Laterality: N/A;  . TUBAL LIGATION    . WISDOM TOOTH EXTRACTION     Family History Family History  Problem Relation Age of Onset  . Cancer Other   . Hypertension Other   . Diabetes Other   . Diabetes Mother   . Heart disease Mother   . Hypertension Mother   . Hypertension Father   . High Cholesterol Father   . Hypertension Sister   . Kidney disease Sister   . Stroke Sister   . Hypertension Sister   . Hearing loss Son     Social History Social History   Tobacco Use  . Smoking status: Never Smoker  . Smokeless tobacco: Never Used  Vaping Use  . Vaping Use: Never used  Substance Use Topics  . Alcohol use: No  . Drug use: No   Allergies Bee venom and Reglan [metoclopramide]  Review of Systems Review of Systems All other systems are reviewed and are negative for acute change except as noted in the HPI  Physical Exam Vital Signs  I have reviewed the triage vital signs BP (!) 136/92 (BP Location: Left Arm)   Pulse 100   Temp 98.5 F (36.9 C) (Oral)   Resp 18   Ht 5\' 4"  (1.626 m)   Wt 92.1 kg   LMP 02/06/2014 (LMP Unknown)   SpO2 91%   BMI 34.84 kg/m   Physical Exam Vitals reviewed.  Constitutional:      General: She is not in acute distress.    Appearance: She is well-developed. She is not diaphoretic.  HENT:     Head: Normocephalic and atraumatic.     Nose: Nose normal.  Eyes:     General: No scleral icterus.       Right eye: No discharge.        Left eye: No discharge.     Conjunctiva/sclera: Conjunctivae normal.      Pupils: Pupils are equal, round, and reactive to light.  Cardiovascular:  Rate and Rhythm: Normal rate and regular rhythm.     Heart sounds: No murmur heard. No friction rub. No gallop.   Pulmonary:     Effort: Pulmonary effort is normal. No respiratory distress.     Breath sounds: Normal breath sounds. No stridor. No rales.  Abdominal:     General: There is no distension.     Palpations: Abdomen is soft.     Tenderness: There is no abdominal tenderness.  Musculoskeletal:     Right forearm: Tenderness present. No bony tenderness.     Right wrist: No tenderness or bony tenderness. Normal range of motion.       Arms:     Cervical back: Normal range of motion and neck supple.     Right hip: Tenderness present. No bony tenderness.       Legs:  Skin:    General: Skin is warm and dry.     Findings: No erythema or rash.  Neurological:     Mental Status: She is alert and oriented to person, place, and time.     Sensory: Sensation is intact.     Motor: Motor function is intact.     ED Results and Treatments Labs (all labs ordered are listed, but only abnormal results are displayed) Labs Reviewed  URINALYSIS, ROUTINE W REFLEX MICROSCOPIC - Abnormal; Notable for the following components:      Result Value   Hgb urine dipstick MODERATE (*)    Leukocytes,Ua SMALL (*)    WBC, UA >50 (*)    All other components within normal limits  CBG MONITORING, ED - Abnormal; Notable for the following components:   Glucose-Capillary 117 (*)    All other components within normal limits  BASIC METABOLIC PANEL  CBC                                                                                                                         EKG  EKG Interpretation  Date/Time:  Sunday Oct 04 2020 20:03:13 EDT Ventricular Rate:  95 PR Interval:  158 QRS Duration: 80 QT Interval:  340 QTC Calculation: 427 R Axis:   44 Text Interpretation: Normal sinus rhythm Normal ECG Confirmed by Drema Pryardama,  Cathrine Krizan ([id number]) on 10/05/2020 6:58:01 AM      Radiology No results found.  Pertinent labs & imaging results that were available during my care of the patient were reviewed by me and considered in my medical decision making (see chart for details).  Medications Ordered in ED Medications  acetaminophen (TYLENOL) tablet 1,000 mg (has no administration in time range)  cephALEXin (KEFLEX) capsule 500 mg (has no administration in time range)  Procedures Procedures  (including critical care time)  Medical Decision Making / ED Course I have reviewed the nursing notes for this encounter and the patient's prior records (if available in EHR or on provided paperwork).   Renee Knapp was evaluated in Emergency Department on 10/05/2020 for the symptoms described in the history of present illness. She was evaluated in the context of the global COVID-19 pandemic, which necessitated consideration that the patient might be at risk for infection with the SARS-CoV-2 virus that causes COVID-19. Institutional protocols and algorithms that pertain to the evaluation of patients at risk for COVID-19 are in a state of rapid change based on information released by regulatory bodies including the CDC and federal and state organizations. These policies and algorithms were followed during the patient's care in the ED.  Right arm pain. Patient denied any falls or trauma concerning for fracture or dislocation. Likely pain from overuse/repetitive use at work. Provided with wrist brace. Recommended supportive management.  Right hip pain. Likely muscle strain/spasm. Again patient denies any falls or trauma concerning for any fracture dislocation. Recommended supportive management.  Given the patient's history of diabetes and frequency, screening labs obtained and grossly reassuring.   Patient did have urinary tract infection we will treat with Keflex.      Final Clinical Impression(s) / ED Diagnoses Final diagnoses:  Muscle strain of right forearm, initial encounter  Right hip pain  Acute lower UTI    The patient appears reasonably screened and/or stabilized for discharge and I doubt any other medical condition or other [facility] requiring further screening, evaluation, or treatment in the ED at this time prior to discharge. Safe for discharge with strict return precautions.  Disposition: Discharge  Condition: Good  I have discussed the results, Dx and Tx plan with the patient/family who expressed understanding and agree(s) with the plan. Discharge instructions discussed at length. The patient/family was given strict return precautions who verbalized understanding of the instructions. No further questions at time of discharge.    ED Discharge Orders         Ordered    cephALEXin (KEFLEX) 500 MG capsule  3 times daily        10/05/20 0700            Follow Up: [name], MD [address] [place] [address] [phone]  Call  as needed     This chart was dictated using voice recognition software.  Despite best efforts to proofread,  errors can occur which can change the documentation meaning.   Nira Conn, MD 10/05/20 [phone]

## 2020-10-08 ENCOUNTER — Other Ambulatory Visit: Payer: Self-pay

## 2020-10-08 ENCOUNTER — Encounter: Payer: Self-pay | Admitting: Family Medicine

## 2020-10-08 ENCOUNTER — Ambulatory Visit (INDEPENDENT_AMBULATORY_CARE_PROVIDER_SITE_OTHER): Payer: BC Managed Care – PPO | Admitting: Family Medicine

## 2020-10-08 VITALS — BP 136/84 | HR 102 | Temp 98.3°F | Ht 64.0 in | Wt 205.4 lb

## 2020-10-08 DIAGNOSIS — E559 Vitamin D deficiency, unspecified: Secondary | ICD-10-CM | POA: Diagnosis not present

## 2020-10-08 DIAGNOSIS — E119 Type 2 diabetes mellitus without complications: Secondary | ICD-10-CM

## 2020-10-08 DIAGNOSIS — E782 Mixed hyperlipidemia: Secondary | ICD-10-CM

## 2020-10-08 DIAGNOSIS — M7918 Myalgia, other site: Secondary | ICD-10-CM

## 2020-10-08 DIAGNOSIS — M7061 Trochanteric bursitis, right hip: Secondary | ICD-10-CM

## 2020-10-08 DIAGNOSIS — N390 Urinary tract infection, site not specified: Secondary | ICD-10-CM

## 2020-10-08 DIAGNOSIS — R202 Paresthesia of skin: Secondary | ICD-10-CM

## 2020-10-08 LAB — COMPREHENSIVE METABOLIC PANEL
ALT: 17 U/L (ref 0–35)
AST: 14 U/L (ref 0–37)
Albumin: 4.3 g/dL (ref 3.5–5.2)
Alkaline Phosphatase: 86 U/L (ref 39–117)
BUN: 9 mg/dL (ref 6–23)
CO2: 32 mEq/L (ref 19–32)
Calcium: 9.5 mg/dL (ref 8.4–10.5)
Chloride: 102 mEq/L (ref 96–112)
Creatinine, Ser: 0.82 mg/dL (ref 0.40–1.20)
GFR: 85.54 mL/min (ref >60.00)
Glucose, Bld: 109 mg/dL — ABNORMAL HIGH (ref 70–99)
Potassium: 3.9 mEq/L (ref 3.5–5.1)
Sodium: 140 mEq/L (ref 135–145)
Total Bilirubin: 0.4 mg/dL (ref 0.2–1.2)
Total Protein: 7.8 g/dL (ref 6.0–8.3)

## 2020-10-08 LAB — URINALYSIS, ROUTINE W REFLEX MICROSCOPIC
Bilirubin Urine: NEGATIVE
Ketones, ur: NEGATIVE
Leukocytes,Ua: NEGATIVE
Nitrite: NEGATIVE
Specific Gravity, Urine: 1.015 (ref 1.000–1.030)
Total Protein, Urine: NEGATIVE
Urine Glucose: NEGATIVE
Urobilinogen, UA: 0.2 (ref 0.0–1.0)
pH: 6 (ref 5.0–8.0)

## 2020-10-08 LAB — LIPID PANEL
Cholesterol: 190 mg/dL (ref 0–200)
HDL: 36.1 mg/dL — ABNORMAL LOW (ref >39.00)
NonHDL: 153.85
Total CHOL/HDL Ratio: 5
Triglycerides: 332 mg/dL — ABNORMAL HIGH (ref 0.0–149.0)
VLDL: 66.4 mg/dL — ABNORMAL HIGH (ref 0.0–40.0)

## 2020-10-08 LAB — MICROALBUMIN / CREATININE URINE RATIO
Creatinine,U: 182 mg/dL
Microalb Creat Ratio: 0.6 mg/g (ref 0.0–30.0)
Microalb, Ur: 1.1 mg/dL (ref 0.0–1.9)

## 2020-10-08 LAB — VITAMIN D 25 HYDROXY (VIT D DEFICIENCY, FRACTURES): VITD: 21.72 ng/mL — ABNORMAL LOW (ref 30.00–100.00)

## 2020-10-08 LAB — HEMOGLOBIN A1C: Hgb A1c MFr Bld: 6.8 % — ABNORMAL HIGH (ref 4.6–6.5)

## 2020-10-08 LAB — TSH: TSH: 1.15 u[IU]/mL (ref 0.35–4.50)

## 2020-10-08 LAB — LDL CHOLESTEROL, DIRECT: Direct LDL: 122 mg/dL

## 2020-10-08 LAB — VITAMIN B12: Vitamin B-12: 799 pg/mL (ref 211–911)

## 2020-10-08 MED ORDER — PREDNISONE 20 MG PO TABS: 40.0000 mg | ORAL_TABLET | Freq: Every day | ORAL | 0 refills | Status: DC

## 2020-10-08 MED ORDER — METFORMIN HCL ER 500 MG PO TB24: 1000.0000 mg | ORAL_TABLET | Freq: Two times a day (BID) | ORAL | 1 refills | Status: DC

## 2020-10-08 MED ORDER — EPINEPHRINE 0.3 MG/0.3ML IJ SOAJ: 0.3000 mg | INTRAMUSCULAR | 1 refills | Status: DC | PRN

## 2020-10-08 NOTE — Progress Notes (Signed)
Patient: Renee Knapp MRN: [id number] DOB: May 16, 1976 PCP: [name], MD     Subjective:  Chief Complaint  Patient presents with  . Diabetes  . right forearm pain  . right hip pain  . Tingling  . er follow up    HPI: The patient is a 45 y.o. female who presents today for DM, Er follow up for UTI and right arm/hip pain, tingling. Also needs refill of her epi pen.    Diabetes: Patient is here for follow up of type 2 diabetes. Currently on the following medications metformin 1000mg  Bid and ozempic weekly. Has not taken her metformin since I saw her last her.  She states she called about this, but there is no note about this and we have received no refills. . Her Last A1C was 6.7-04/2020. Currently not exercising and following diabetic diet.  Denies any hypoglycemic events. Denies any vision changes, nausea, vomiting, abdominal pain, ulcers/paraesthesia in feet, polyuria, polydipsia or polyphagia. Denies any chest pain, shortness of breath.  Pain in right hip and right forearm.  Started about 2 weeks ago. Starts right at her right hip and then radiates down her right leg to her toes. It feels like her foot is trying to wake up (tingling). She feels like she has been stumbling, no falls. No urinary incontinence or loss of sensation in her saddle area. No weakness. no trauma. Rated 9/10. No back pain.   Her right fingers are also tingling. It comes and goes. She is right handed. No trauma at all. Nothing seems to make it better, heavy lifting makes it worse. She also has pain in her right forearm with heavy lifting. Denies any trauma or injury to right side. Pain rated as a 9/10. Pain is constant in both places. She has only taken tylenol. She has been opening lots of cans with can opener before her arm pain started.   ER follow up for UTI -no urine culture. Put on keflex. Doing better. Labs/note reviewed.    Review of Systems  Constitutional: Negative for appetite change, chills,  fatigue and fever.  HENT: Negative for dental problem, ear pain, hearing loss and trouble swallowing.   Eyes: Negative for visual disturbance.  Respiratory: Negative for cough, chest tightness and shortness of breath.   Cardiovascular: Negative for chest pain, palpitations and leg swelling.  Gastrointestinal: Negative for abdominal pain, blood in stool, diarrhea and nausea.  Endocrine: Negative for cold intolerance, polydipsia, polyphagia and polyuria.  Genitourinary: Negative for dysuria and hematuria.  Musculoskeletal: Positive for myalgias. Negative for arthralgias.  Skin: Negative for rash.  Neurological: Negative for dizziness, weakness, numbness and headaches.       Tingling in right arm/foot.   Psychiatric/Behavioral: Negative for dysphoric mood and sleep disturbance. The patient is not nervous/anxious.     Allergies Patient is allergic to bee venom and reglan [metoclopramide].  Past Medical History Patient  has a past medical history of Anemia, Diabetes mellitus without complication (HCC), Obesity, Plantar fasciitis, bilateral, and SVD (spontaneous vaginal delivery).  Surgical History Patient  has a past surgical history that includes Tubal ligation; Dilation and curettage of uterus; Wisdom tooth extraction; Laparoscopic assisted vaginal hysterectomy (N/A, 02/24/2014); and Cystoscopy (N/A, 02/24/2014).  Family History Pateint's family history includes Cancer in an other family member; Diabetes in her mother and another family member; Hearing loss in her son; Heart disease in her mother; High Cholesterol in her father; Hypertension in her father, mother, sister, sister, and another family member; Kidney disease in  her sister; Stroke in her sister.  Social History Patient  reports that she has never smoked. She has never used smokeless tobacco. She reports that she does not drink alcohol and does not use drugs.    Objective: Vitals:   10/08/20 1314  BP: 136/84  Pulse: (!) 102   Temp: 98.3 F (36.8 C)  TempSrc: Temporal  SpO2: 98%  Weight: 205 lb 6.4 oz (93.2 kg)  Height: 5\' 4"  (1.626 m)    Body mass index is 35.26 kg/m.  Physical Exam Vitals reviewed.  Constitutional:      Appearance: Normal appearance. She is well-developed. She is obese.  HENT:     Head: Normocephalic and atraumatic.     Right Ear: Tympanic membrane, ear canal and external ear normal.     Left Ear: Tympanic membrane, ear canal and external ear normal.  Eyes:     Extraocular Movements: Extraocular movements intact.     Conjunctiva/sclera: Conjunctivae normal.     Pupils: Pupils are equal, round, and reactive to light.  Neck:     Thyroid: No thyromegaly.     Vascular: No carotid bruit.  Cardiovascular:     Rate and Rhythm: Normal rate and regular rhythm.     Heart sounds: Normal heart sounds. No murmur heard.   Pulmonary:     Effort: Pulmonary effort is normal.     Breath sounds: Normal breath sounds.  Abdominal:     General: Bowel sounds are normal. There is no distension.     Palpations: Abdomen is soft.     Tenderness: There is no abdominal tenderness.  Musculoskeletal:        General: Tenderness (along right brachioradialis. some ttp with resisted wrist extension, but no ttp over epicondyle) present.     Cervical back: Normal range of motion and neck supple.     Comments: Point TTP over right greater trochanteric process.   Lymphadenopathy:     Cervical: No cervical adenopathy.  Skin:    General: Skin is warm and dry.     Capillary Refill: Capillary refill takes less than 2 seconds.     Findings: No rash.  Neurological:     General: No focal deficit present.     Mental Status: She is alert and oriented to person, place, and time.     Cranial Nerves: No cranial nerve deficit.     Coordination: Coordination normal.     Deep Tendon Reflexes: Reflexes normal.  Psychiatric:        Mood and Affect: Mood normal.        Behavior: Behavior normal.        Assessment/plan: 1. Controlled type 2 diabetes mellitus without complication, without long-term current use of insulin (HCC) -has been well conrolled, a1c today.  -discussed she needs to call or email about medication, especially if she doesn't get. Can't just stop taking.  -continue trulicity and metformin, refilled metformin.  -needs eye exam -stopped her statin, will address after labs.  -encouraged exercise, she is very limited with this.  -utd on vaccines.  -f/u in 6 months.  - COMPLETE METABOLIC PANEL WITH GFR - Comprehensive metabolic panel - Hemoglobin A1c - TSH - Microalbumin / creatinine urine ratio  2. Mixed hyperlipidemia -stopped her statin, unsure why. Will address after labs return.  - Lipid panel  3. Vitamin D deficiency  - VITAMIN D 25 Hydroxy (Vit-D Deficiency, Fractures)  4. Tingling ? sciatica vs. Bursitis. Steroid burst, checking b12/ana and labs as well. May benefit  from gabapentin.  - Vitamin B12 - ANA  5. Urinary tract infection without hematuria, site unspecified Doing well. On keflex. Repeating ua/culture.  - Urinalysis, Routine w reflex microscopic - Urine Culture  6. Trochanteric bursitis, right hip Oral steroids and referral to sports medicine as she may need PT or injection, want to make sure she has f/u with me leaving. Handout given as well with exercises.  - Ambulatory referral to Sports Medicine  7. Brachioradialis muscle tenderness Likely secondary to overuse from opening so many cans with can opener. Discussed brace, voltaren gel QID, ice. Stop opening cans and rest as needed.    Total time of encounter: 50 minutes total time of encounter, including 35 minutes spent in face-to-face patient care. This time includes coordination of care and counseling regarding chronic medical problems and new acute issues including work up and treatment. Remainder of non-face-to-face time involved reviewing chart documents/testing relevant to the patient  encounter and documentation in the medical record.    Return in about 6 months (around 04/10/2021) for diabetes f/u with dr. [name] .    [name], MD [place] [facility]   10/08/2020

## 2020-10-08 NOTE — Patient Instructions (Addendum)
-I think you have bursitis in your hip and possibly some sciatica as well.  -steroid burst to help calm nerve/inflammation down. Will make sugars a little high.  -read handout on bursitis -follow up with sports med.    -for your arm, seems like you have overused your tendon. Can try a brace for tennis elbow and I would use voltaren gel four times a day and ice and rest. No more opening cans!   -routine diabetic labs.   Good here for 6 months.      Sciatica  Sciatica is pain, weakness, tingling, or loss of feeling (numbness) along the sciatic nerve. The sciatic nerve starts in the lower back and goes down the back of each leg. Sciatica usually goes away on its own or with treatment. Sometimes, sciatica may come back (recur). What are the causes? This condition happens when the sciatic nerve is pinched or has pressure put on it. This may be the result of:  A disk in between the bones of the spine bulging out too far (herniated disk).  Changes in the spinal disks that occur with aging.  A condition that affects a muscle in the butt.  Extra bone growth near the sciatic nerve.  A break (fracture) of the area between your hip bones (pelvis).  Pregnancy.  Tumor. This is rare. What increases the risk? You are more likely to develop this condition if you:  Play sports that put pressure or stress on the spine.  Have poor strength and ease of movement (flexibility).  Have had a back injury in the past.  Have had back surgery.  Sit for long periods of time.  Do activities that involve bending or lifting over and over again.  Are very overweight (obese). What are the signs or symptoms? Symptoms can vary from mild to very bad. They may include:  Any of these problems in the lower back, leg, hip, or butt: ? Mild tingling, loss of feeling, or dull aches. ? Burning sensations. ? Sharp pains.  Loss of feeling in the back of the calf or the sole of the foot.  Leg  weakness.  Very bad back pain that makes it hard to move. These symptoms may get worse when you cough, sneeze, or laugh. They may also get worse when you sit or stand for long periods of time. How is this treated? This condition often gets better without any treatment. However, treatment may include:  Changing or cutting back on physical activity when you have pain.  Doing exercises and stretching.  Putting ice or heat on the affected area.  Medicines that help: ? To relieve pain and swelling. ? To relax your muscles.  Shots (injections) of medicines that help to relieve pain, irritation, and swelling.  Surgery. Follow these instructions at home: Medicines  Take over-the-counter and prescription medicines only as told by your doctor.  Ask your doctor if the medicine prescribed to you: ? Requires you to avoid driving or using heavy machinery. ? Can cause trouble pooping (constipation). You may need to take these steps to prevent or treat trouble pooping:  Drink enough fluids to keep your pee (urine) pale yellow.  Take over-the-counter or prescription medicines.  Eat foods that are high in fiber. These include beans, whole grains, and fresh fruits and vegetables.  Limit foods that are high in fat and sugar. These include fried or sweet foods. Managing pain  If told, put ice on the affected area. ? Put ice in a plastic  bag. ? Place a towel between your skin and the bag. ? Leave the ice on for 20 minutes, 2-3 times a day.  If told, put heat on the affected area. Use the heat source that your doctor tells you to use, such as a moist heat pack or a heating pad. ? Place a towel between your skin and the heat source. ? Leave the heat on for 20-30 minutes. ? Remove the heat if your skin turns bright red. This is very important if you are unable to feel pain, heat, or cold. You may have a greater risk of getting burned.      Activity  Return to your normal activities as told  by your doctor. Ask your doctor what activities are safe for you.  Avoid activities that make your symptoms worse.  Take short rests during the day. ? When you rest for a long time, do some physical activity or stretching between periods of rest. ? Avoid sitting for a long time without moving. Get up and move around at least one time each hour.  Exercise and stretch regularly, as told by your doctor.  Do not lift anything that is heavier than 10 lb (4.5 kg) while you have symptoms of sciatica. ? Avoid lifting heavy things even when you do not have symptoms. ? Avoid lifting heavy things over and over.  When you lift objects, always lift in a way that is safe for your body. To do this, you should: ? Bend your knees. ? Keep the object close to your body. ? Avoid twisting.   General instructions  Stay at a healthy weight.  Wear comfortable shoes that support your feet. Avoid wearing high heels.  Avoid sleeping on a mattress that is too soft or too hard. You might have less pain if you sleep on a mattress that is firm enough to support your back.  Keep all follow-up visits as told by your doctor. This is important. Contact a doctor if:  You have pain that: ? Wakes you up when you are sleeping. ? Gets worse when you lie down. ? Is worse than the pain you have had in the past. ? Lasts longer than 4 weeks.  You lose weight without trying. Get help right away if:  You cannot control when you pee (urinate) or poop (have a bowel movement).  You have weakness in any of these areas and it gets worse: ? Lower back. ? The area between your hip bones. ? Butt. ? Legs.  You have redness or swelling of your back.  You have a burning feeling when you pee. Summary  Sciatica is pain, weakness, tingling, or loss of feeling (numbness) along the sciatic nerve.  This condition happens when the sciatic nerve is pinched or has pressure put on it.  Sciatica can cause pain, tingling, or loss  of feeling (numbness) in the lower back, legs, hips, and butt.  Treatment often includes rest, exercise, medicines, and putting ice or heat on the affected area. This information is not intended to replace advice given to you by your health care provider. Make sure you discuss any questions you have with your health care provider. Document Revised: 05/28/2018 Document Reviewed: 05/28/2018 Elsevier Patient Education  2021 [employer].

## 2020-10-09 ENCOUNTER — Encounter: Payer: Self-pay | Admitting: Family Medicine

## 2020-10-09 LAB — URINE CULTURE
MICRO NUMBER:: 11911301
Result:: NO GROWTH
SPECIMEN QUALITY:: ADEQUATE

## 2020-10-09 LAB — ANA: Anti Nuclear Antibody (ANA): NEGATIVE

## 2020-10-12 ENCOUNTER — Encounter: Payer: Self-pay | Admitting: Family Medicine

## 2020-10-12 ENCOUNTER — Other Ambulatory Visit: Payer: Self-pay | Admitting: Family Medicine

## 2020-10-12 DIAGNOSIS — Z20822 Contact with and (suspected) exposure to covid-19: Secondary | ICD-10-CM | POA: Diagnosis not present

## 2020-10-12 MED ORDER — ROSUVASTATIN CALCIUM 10 MG PO TABS: 10.0000 mg | ORAL_TABLET | Freq: Every day | ORAL | 3 refills | Status: DC

## 2020-10-12 MED ORDER — VITAMIN D (ERGOCALCIFEROL) 1.25 MG (50000 UNIT) PO CAPS: ORAL_CAPSULE | ORAL | 0 refills | Status: DC

## 2020-10-12 NOTE — Telephone Encounter (Signed)
Duplicate

## 2020-10-14 ENCOUNTER — Other Ambulatory Visit: Payer: Self-pay

## 2020-10-14 ENCOUNTER — Encounter: Payer: Self-pay | Admitting: Family Medicine

## 2020-10-14 ENCOUNTER — Telehealth (INDEPENDENT_AMBULATORY_CARE_PROVIDER_SITE_OTHER): Payer: BC Managed Care – PPO | Admitting: Family Medicine

## 2020-10-14 ENCOUNTER — Ambulatory Visit: Payer: BC Managed Care – PPO | Admitting: Family Medicine

## 2020-10-14 DIAGNOSIS — U071 COVID-19: Secondary | ICD-10-CM

## 2020-10-14 MED ORDER — NIRMATRELVIR/RITONAVIR (PAXLOVID)TABLET: 3.0000 | ORAL_TABLET | Freq: Two times a day (BID) | ORAL | 0 refills | Status: AC

## 2020-10-14 NOTE — Progress Notes (Signed)
Virtual Visit via Video   I connected with patient on 10/14/20 at  2:30 PM EDT by a video enabled telemedicine application and verified that I am speaking with the correct person using two identifiers.  Location patient: Home Location provider: [place] [facility] Persons participating in the virtual visit: Patient, Provider, CMA (Sabrina M)  I discussed the limitations of evaluation and management by telemedicine and the availability of in person appointments. The patient expressed understanding and agreed to proceed.  Subjective:   HPI:   COVID- 'I tested + for COVID' yesterday.  + HA and sore throat- started Saturday.  No fevers.  + chills.  + cough- 'sounds like a smokers cough'.  Cough is productive of green sputum.  + frontal sinus pain.  + nausea x3 weeks.  No known sick contacts.  Has been taking abx for UTI.  ROS:   See pertinent positives and negatives per HPI.  Patient Active Problem List   Diagnosis Date Noted  . GAD (generalized anxiety disorder) 08/30/2018  . Vitamin D deficiency 01/18/2018  . Hyperlipidemia 11/25/2017  . Controlled type 2 diabetes mellitus without complication, without long-term current use of insulin (HCC) 11/10/2017  . Acute pericarditis 09/01/2015  . S/P laparoscopic assisted vaginal hysterectomy (LAVH) 02/24/2014  . Trichomonas infection 11/01/2013  . Gonorrhea 11/01/2013  . Urinary tract infection 10/28/2013  . Acute foot pain 09/11/2013    Social History   Tobacco Use  . Smoking status: Never Smoker  . Smokeless tobacco: Never Used  Substance Use Topics  . Alcohol use: No    Current Outpatient Medications:  .  Bayer Microlet Lancets lancets, USE 1 SINGLE USE LANCETS AS NEEDED TO CHECK BLOOD SUGAR, Disp: 100 each, Rfl: 1 .  CONTOUR NEXT TEST test strip, USE 1 LANCETS AS NEEDED TO CHECK BLOOD SUGAR, Disp: 100 strip, Rfl: 1 .  EPINEPHrine (EPIPEN 2-PAK) 0.3 mg/0.3 mL IJ SOAJ injection, Inject 0.3 mg into the muscle as needed  for anaphylaxis., Disp: 2 each, Rfl: 1 .  FLUoxetine (PROZAC) 20 MG tablet, TAKE 1 TABLET BY MOUTH EVERY DAY (Patient taking differently: Take 20 mg by mouth daily as needed (anxiety).), Disp: 90 tablet, Rfl: 2 .  ondansetron (ZOFRAN-ODT) 4 MG disintegrating tablet, Take 4 mg by mouth every 8 (eight) hours as needed., Disp: , Rfl:  .  OZEMPIC, 0.25 OR 0.5 MG/DOSE, 2 MG/1.5ML SOPN, INJECT 0.5 MG INTO THE SKIN ONCE A WEEK. (Patient taking differently: Inject 0.5 mg into the skin every Sunday.), Disp: 4.5 mL, Rfl: 1 .  predniSONE (DELTASONE) 20 MG tablet, Take 2 tablets (40 mg total) by mouth daily with breakfast., Disp: 10 tablet, Rfl: 0 .  rosuvastatin (CRESTOR) 10 MG tablet, Take 1 tablet (10 mg total) by mouth daily., Disp: 90 tablet, Rfl: 3 .  Vitamin D, Cholecalciferol, 25 MCG (1000 UT) TABS, Take 1,000 Units by mouth daily. gummy, Disp: , Rfl:  .  Vitamin D, Ergocalciferol, (DRISDOL) 1.25 MG (50000 UNIT) CAPS capsule, One capsule by mouth once a week for 12 weeks. Then take 4000IU/day of D3., Disp: 12 capsule, Rfl: 0 .  Cetirizine HCl 10 MG CAPS, Take 1 capsule (10 mg total) by mouth daily for 10 days. (Patient not taking: No sig reported), Disp: 10 capsule, Rfl: 0 .  metFORMIN (GLUCOPHAGE-XR) 500 MG 24 hr tablet, Take 2 tablets (1,000 mg total) by mouth 2 (two) times daily. (Patient not taking: Reported on 10/14/2020), Disp: 360 tablet, Rfl: 1  Allergies  Allergen Reactions  .  Bee Venom Anaphylaxis  . Reglan [Metoclopramide] Nausea And Vomiting and Anxiety    Objective:   LMP 02/06/2014 (LMP Unknown)  AAOx3, NAD NCAT, EOMI No obvious CN deficits Coloring WNL Pt is able to speak clearly, coherently without shortness of breath or increased work of breathing.  No cough heard Thought process is linear.  Mood is appropriate.   Assessment and Plan:   COVID- new.  Pt tested + yesterday after developing sxs on Saturday.  Given her obesity and underlying DM she is at mildly increased risk  for developing serious COVID.  Given this, will start Paxlovid.  She will hold her Crestor during treatment.  Encouraged Vit D, Vit C, and Zinc.  Reviewed supportive care w/ cough medication, Mucinex, tylenol/ibuprofen.  Enrolled in MyChart monitoring.  Note provided for work.  Pt expressed understanding and is in agreement w/ plan.    Neena Rhymes, MD 10/14/2020

## 2020-10-14 NOTE — Progress Notes (Signed)
I connected with  Renee Knapp on 10/14/20 by a video enabled telemedicine application and verified that I am speaking with the correct person using two identifiers.   I discussed the limitations of evaluation and management by telemedicine. The patient expressed understanding and agreed to proceed.

## 2020-10-14 NOTE — Progress Notes (Deleted)
   I, [name], LAT, ATC acting as a scribe for Clementeen Graham, MD.  Subjective:    I'm seeing this patient as a consultation for: Dr. [name]. Note will be routed back to referring provider/PCP.  CC: Right hip & right forearm pain  HPI: Pt is a 45 y/o female c/o R hip and R forearm pain ongoing since early May w/ no know MOI. Pt was seen at the [facility] ED on 10/04/20 w/ these complaints. Pt locates her R hip pain to   Radiates: yes- distally to R leg and toes Mechanical symptoms: LE Numbness/tingling: yes LE Weakness: Aggravates: Treatments tried:  Pt also c/o R forearm pain. Pt is R-hand dominate. Pt locates pain to   Grip strength: Numbness/tingling: yes- into fingers Aggravates: heavy lifting Treatments tried: Tylenol  Dx imaging: 12/12/17 R wrist XR  12/09/17 R hand XR  08/27/17 R wrist XR  Past medical history, Surgical history, Family history, Social history, Allergies, and medications have been entered into the medical record, reviewed. ***  Review of Systems: No new headache, visual changes, nausea, vomiting, diarrhea, constipation, dizziness, abdominal pain, skin rash, fevers, chills, night sweats, weight loss, swollen lymph nodes, body aches, joint swelling, muscle aches, chest pain, shortness of breath, mood changes, visual or auditory hallucinations.   Objective:   There were no vitals filed for this visit. General: Well Developed, well nourished, and in no acute distress.  Neuro/Psych: Alert and oriented x3, extra-ocular muscles intact, able to move all 4 extremities, sensation grossly intact. Skin: Warm and dry, no rashes noted.  Respiratory: Not using accessory muscles, speaking in full sentences, trachea midline.  Cardiovascular: Pulses palpable, no extremity edema. Abdomen: Does not appear distended. MSK: ***  Lab and Radiology Results No results found for this or any previous visit (from the past 72 hour(s)). No results found.  Impression and  Recommendations:    Assessment and Plan: 45 y.o. female with ***.  PDMP not reviewed this encounter. No orders of the defined types were placed in this encounter.  No orders of the defined types were placed in this encounter.   Discussed warning signs or symptoms. Please see discharge instructions. Patient expresses understanding.   ***

## 2020-10-18 ENCOUNTER — Telehealth: Payer: Self-pay

## 2020-10-18 ENCOUNTER — Encounter (INDEPENDENT_AMBULATORY_CARE_PROVIDER_SITE_OTHER): Payer: Self-pay

## 2020-10-18 NOTE — Telephone Encounter (Signed)
Called pt and LM. Sent the following information and a call back number. Sent pt information below via MyChart. Diarrhea: . If diarrhea remains the same:  encourage patient to drink oral fluids and bland foods.  . Avoid alcohol, spicy foods, caffeine or fatty foods that could make diarrhea worse.  . Continue to monitor for signs of dehydration (increased thirst decreased urine output, yellow urine, dry skin, headache or dizziness).  . Advise patient to try OTC medication (Imodium, kaopectate, Pepto-Bismol) as per manufacturer's instructions. . If worsening diarrhea occurs and becomes severe (6-7 bowel movements a day): notify PCP  . If diarrhea last greater than 7 days: notify PCP . If signs of dehydration occur (increased thirst, decreased urine output, yellow urine, dry skin, headache or dizziness) advise patient to call 911 and seek treatment in the ED  Any questions please call [phone].

## 2020-10-20 NOTE — Progress Notes (Signed)
Subjective:    I'm seeing this patient as a consultation for:  Dr Artis Flock. Note will be routed back to referring provider/PCP.  CC: R leg and R arm pain  I, Molly Weber, LAT, ATC, am serving as scribe for Dr. Clementeen Graham.  HPI: Pt is a 45 y/o female presenting w/ R leg and R arm pain.  R leg pain: Began about 3 weeks ago. Pt locates pain lateral aspect of R hip and radiates numbness/tinging down the lateral aspect of R thigh distally to R foot. Pt expresses concern because being diabetic -Radiating pain: yes in entire R leg -Low back pain: No -R LE numbness/tingling: yes in her R foot -Aggravating factors: no- constant pain and numbness -Treatments tried: prednisone; Tylenol  R arm pain: Pt experienced a pop in thenar eminence while at work 4 months ago. Locates pain to sometimes R thenar eminence and up into R elbow. Pt is experiencing decreased grip strength and is having difficulty opening cans and pick things up. Pt also expresses concern for "swelling" in bilat trapz. -Radiating pain: yes- proximally to R elbow -R UE numbness/tingling: yes in her R fingers -Aggravating factors: gripping; using manual can opener -Treatments tried: Tylenol; prednisone   Past medical history, Surgical history, Family history, Social history, Allergies, and medications have been entered into the medical record, reviewed.   Review of Systems: No new headache, visual changes, nausea, vomiting, diarrhea, constipation, dizziness, abdominal pain, skin rash, fevers, chills, night sweats, weight loss, swollen lymph nodes, body aches, joint swelling, muscle aches, chest pain, shortness of breath, mood changes, visual or auditory hallucinations.   Objective:    Vitals:   10/21/20 1127  BP: (!) 148/98  Pulse: 92  SpO2: 97%   General: Well Developed, well nourished, and in no acute distress.  Neuro/Psych: Alert and oriented x3, extra-ocular muscles intact, able to move all 4 extremities, sensation  grossly intact. Skin: Warm and dry, no rashes noted.  Respiratory: Not using accessory muscles, speaking in full sentences, trachea midline.  Cardiovascular: Pulses palpable, no extremity edema. Abdomen: Does not appear distended. MSK:  Right elbow normal-appearing  Normal motion. Tender palpation lateral epicondyle. Normal elbow strength. Pain with resisted wrist and finger extension.  Right hand normal-appearing normal motion normal strength.  Tender palpation palmar MCP.  Pain with flexion of IP joint.  No obvious triggering present Stable hand ligamentous exam testing.  L-spine normal-appearing nontender midline. Normal lumbar motion. Lower extremity strength is intact except noted below. Reflexes are intact. Sensation intact. Mildly positive right-sided slump test.  Right hip normal-appearing normal motion. Tender palpation greater trochanter. Hip abduction strength diminished 4/5.  Lab and Radiology Results  Diagnostic Limited MSK Ultrasound of: Right elbow and right thumb  Right elbow: Lateral epicondyle visualized.  Small amount of calcification at common extensor tendon insertion consistent with lateral epicondylitis.  No tears visible.  Right thumb: Mild hypoechoic fluid surrounds flexor tendon at palmar MCP.  Possible developing trigger finger. MCP otherwise normal-appearing intact UCL  Impression: Lateral epicondylitis right elbow. Suspect developing trigger thumb   X-ray images L-spine obtained today personally and independently interpreted Mild DDD.  No acute fractures. Await formal radiology review   Impression and Recommendations:    Assessment and Plan: 45 y.o. female with  Lumbar radiculopathy right leg in an L5 dermatomal pattern.  Right lateral hip pain due to trochanteric bursitis/hip abductor tendinopathy  Right lateral elbow pain due to lateral epicondylitis  Right thumb pain thought to be due to developing  trigger thumb  Plan to treat  with physical therapy for lumbar radiculopathy trochanteric bursitis and lateral epicondylitis.  We will use prednisone and gabapentin for lumbar radiculopathy.  We used double Band-Aid splint for trigger thumb and Voltaren gel for lateral epicondylitis and trigger thumb.  Recheck in 6 weeks.  Return sooner if needed.Marland Kitchen  PDMP not reviewed this encounter. Orders Placed This Encounter  Procedures  . [place] LIMITED JOINT SPACE STRUCTURES LOW RIGHT(NO LINKED CHARGES)    Standing Status:   Future    Number of Occurrences:   1    Standing Expiration Date:   04/22/2021    Order Specific Question:   Reason for Exam (SYMPTOM  OR DIAGNOSIS REQUIRED)    Answer:   right hip pain    Order Specific Question:   Preferred imaging location?    Answer:   [facility]  . DG Lumbar Spine 2-3 Views    Standing Status:   Future    Number of Occurrences:   1    Standing Expiration Date:   10/21/2021    Order Specific Question:   Reason for Exam (SYMPTOM  OR DIAGNOSIS REQUIRED)    Answer:   eval rt l5 rad    Order Specific Question:   Is patient pregnant?    Answer:   No    Order Specific Question:   Preferred imaging location?    Answer:   [facility]  . Ambulatory referral to Physical Therapy    Referral Priority:   Routine    Referral Type:   Physical Medicine    Referral Reason:   Specialty Services Required    Requested Specialty:   Physical Therapy   Meds ordered this encounter  Medications  . predniSONE (DELTASONE) 50 MG tablet    Sig: Take 1 tablet (50 mg total) by mouth daily.    Dispense:  5 tablet    Refill:  0  . gabapentin (NEURONTIN) 300 MG capsule    Sig: Take 1 capsule (300 mg total) by mouth 3 (three) times daily as needed.    Dispense:  90 capsule    Refill:  3    Discussed warning signs or symptoms. Please see discharge instructions. Patient expresses understanding.   The above documentation has been reviewed and is accurate and complete Clementeen Graham,  M.D.

## 2020-10-21 ENCOUNTER — Ambulatory Visit: Payer: Self-pay

## 2020-10-21 ENCOUNTER — Ambulatory Visit (INDEPENDENT_AMBULATORY_CARE_PROVIDER_SITE_OTHER): Payer: BC Managed Care – PPO

## 2020-10-21 ENCOUNTER — Ambulatory Visit (INDEPENDENT_AMBULATORY_CARE_PROVIDER_SITE_OTHER): Payer: BC Managed Care – PPO | Admitting: Family Medicine

## 2020-10-21 ENCOUNTER — Other Ambulatory Visit: Payer: Self-pay

## 2020-10-21 VITALS — BP 148/98 | HR 92 | Ht 64.0 in | Wt 202.4 lb

## 2020-10-21 DIAGNOSIS — M25551 Pain in right hip: Secondary | ICD-10-CM | POA: Diagnosis not present

## 2020-10-21 DIAGNOSIS — M7711 Lateral epicondylitis, right elbow: Secondary | ICD-10-CM | POA: Diagnosis not present

## 2020-10-21 DIAGNOSIS — M5416 Radiculopathy, lumbar region: Secondary | ICD-10-CM

## 2020-10-21 DIAGNOSIS — M65311 Trigger thumb, right thumb: Secondary | ICD-10-CM

## 2020-10-21 DIAGNOSIS — M545 Low back pain, unspecified: Secondary | ICD-10-CM | POA: Diagnosis not present

## 2020-10-21 DIAGNOSIS — M7061 Trochanteric bursitis, right hip: Secondary | ICD-10-CM

## 2020-10-21 MED ORDER — GABAPENTIN 300 MG PO CAPS: 300.0000 mg | ORAL_CAPSULE | Freq: Three times a day (TID) | ORAL | 3 refills | Status: DC | PRN

## 2020-10-21 MED ORDER — PREDNISONE 50 MG PO TABS: 50.0000 mg | ORAL_TABLET | Freq: Every day | ORAL | 0 refills | Status: DC

## 2020-10-21 NOTE — Patient Instructions (Signed)
Thank you for coming in today.  I've referred you to Physical Therapy.  Let us know if you don't hear from them in one week.  Use the double bandaid splint mostly at bedtime.   Please use Voltaren gel (Generic Diclofenac Gel) up to 4x daily for pain as needed.  This is available over-the-counter as both the name brand Voltaren gel and the generic diclofenac gel.  Do the home exercises we reviewed.   Recheck with me in 6 weeks.   Use gabepentin mostly at bedtime.   Let me know if this is not working.   Please get an Xray today before you leave

## 2020-10-22 DIAGNOSIS — Z20822 Contact with and (suspected) exposure to covid-19: Secondary | ICD-10-CM | POA: Diagnosis not present

## 2020-10-22 NOTE — Progress Notes (Signed)
Xray lumbar spine shows arthritis. No fractures seen

## 2020-11-10 ENCOUNTER — Other Ambulatory Visit: Payer: Self-pay

## 2020-11-10 ENCOUNTER — Encounter: Payer: Self-pay | Admitting: Family Medicine

## 2020-11-10 ENCOUNTER — Ambulatory Visit: Payer: BC Managed Care – PPO | Admitting: Physical Therapy

## 2020-11-10 DIAGNOSIS — M25521 Pain in right elbow: Secondary | ICD-10-CM | POA: Diagnosis not present

## 2020-11-10 DIAGNOSIS — M79604 Pain in right leg: Secondary | ICD-10-CM

## 2020-11-10 DIAGNOSIS — M25551 Pain in right hip: Secondary | ICD-10-CM | POA: Diagnosis not present

## 2020-11-15 ENCOUNTER — Encounter: Payer: Self-pay | Admitting: Physical Therapy

## 2020-11-16 NOTE — Therapy (Addendum)
[facility] [address] [place], [place], [address] Phone: [phone]   Fax:  [phone]  Physical Therapy Evaluation  Patient Details  Name: Renee Knapp MRN: [id number] Date of Birth: [birth year] Referring Provider (PT): [name]   Encounter Date: 11/10/2020   PT End of Session - 11/16/20 2131     Visit Number 1    Number of Visits 12    Date for PT Re-Evaluation 12/22/20    Authorization Type BCBS    PT Start Time 1430    PT Stop Time 1510    PT Time Calculation (min) 40 min    Activity Tolerance Patient tolerated treatment well    Behavior During Therapy [facility] for tasks assessed/performed             Past Medical History:  Diagnosis Date   Anemia    History   Diabetes mellitus without complication ([place])    diet and exercise controlled - no meds   Obesity    Plantar fasciitis, bilateral    History - recvd cortisone injections bilateral- no current prob as 01/2014   SVD (spontaneous vaginal delivery)    x 2    Past Surgical History:  Procedure Laterality Date   CYSTOSCOPY N/A 02/24/2014   Procedure: CYSTOSCOPY;  Surgeon: Sanjuana Kava, MD;  Location: [facility];  Service: Gynecology;  Laterality: N/A;   DILATION AND CURETTAGE OF UTERUS     MAB   LAPAROSCOPIC ASSISTED VAGINAL HYSTERECTOMY N/A 02/24/2014   Procedure: LAPAROSCOPIC ASSISTED VAGINAL HYSTERECTOMY WITH BILATERAL SALPINGECTOMY ;  Surgeon: Sanjuana Kava, MD;  Location: [facility];  Service: Gynecology;  Laterality: N/A;   TUBAL LIGATION     WISDOM TOOTH EXTRACTION      There were no vitals filed for this visit.    Subjective Assessment - 11/15/20 2057     Subjective Increased pain in R leg, into lower leg. Taking gabapentin, doesnt feel that it is helping. Did have + covid test at time of increased pain, she thinks it may be somehow related.   Pt states contstant pain in R leg, that doesnt change depending on activity. 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 Also has increased pain R elbow into hand that is constant. Is R handed.    Patient Stated Goals decreased pain    Currently in Pain? Yes    Pain Score 9     Pain Location Arm    Pain Orientation Right    Pain Descriptors / Indicators Aching    Pain Type Acute pain    Pain Radiating Towards into hand, feels tingling into hand    Pain Onset More than a month ago    Pain Frequency Intermittent    Aggravating Factors  lifting, gripping,    Pain Score 8    Pain Location Leg    Pain Orientation Right    Pain Descriptors / Indicators Aching    Pain Type Acute pain    Pain Onset More than a month ago    Pain Frequency Intermittent    Aggravating Factors  none stated    Pain Relieving Factors none stated                [facility] PT Assessment - 11/16/20 0001       Assessment   Medical Diagnosis R HIp pain , R elbow pain    Referring Provider (PT) [name]  Hand Dominance Right    Prior Therapy no      Precautions   Precautions None      Balance Screen   Has the patient fallen in the past 6 months No      Prior Function   Level of Independence Independent      Cognition   Overall Cognitive Status Within Functional Limits for tasks assessed      ROM / Strength   AROM / PROM / Strength AROM;Strength      AROM   Overall AROM Comments Elbow: WNL, [place]: WNL, Hips: WNL, lumbar: mild deficits      Strength   Overall Strength Comments shoulders: 4/5, elbow: 4/5, Hips: 4/5      Palpation   Palpation comment Pain in R gr trochanter, into lateral leg, ITB.; Pain in R lateral elbow      Special Tests   Other special tests Neg SLR,                        Objective measurements completed on examination: See above findings.       [facility] Adult PT Treatment/Exercise - 11/16/20 0001       Exercises   Exercises Elbow;Lumbar      Elbow Exercises   Other elbow  exercises Wrist extensor stretch 30 sec x 3;      Lumbar Exercises: Stretches   Single Knee to Chest Stretch 3 reps;30 seconds    Piriformis Stretch 3 reps;30 seconds    Piriformis Stretch Limitations supine fig 4;      Lumbar Exercises: Sidelying   Hip Abduction 10 reps;Both                    PT Education - 11/16/20 2131     Education Details PT POC, Exam findings, HEP    Person(s) Educated Patient    Methods Explanation;Demonstration;Tactile cues;Verbal cues;Handout    Comprehension Verbalized understanding;Returned demonstration;Verbal cues required;Need further instruction;Tactile cues required              PT Short Term Goals - 11/16/20 2136       PT SHORT TERM GOAL #1   Title Pt to be independent with initial HEP    Time 2    Period Weeks    Status New    Target Date 11/24/20               PT Long Term Goals - 11/16/20 2148       PT LONG TERM GOAL #1   Title Pt to be independent with final HEP    Time 6    Period Weeks    Status New    Target Date 12/22/20      PT LONG TERM GOAL #2   Title Pt to report decreased pain in R hip and leg to 0-2/10 with activity.    Time 6    Period Weeks    Status New    Target Date 12/22/20      PT LONG TERM GOAL #3   Title Pt to report decreased pain in R arm and elbow to 0-2/10 with activity    Time 6    Period Weeks    Status New    Target Date 12/22/20      PT LONG TERM GOAL #4   Title Pt to demo ability for lifting, carrying, up to 20 lb, without pain in arm/elbow, to improve ability for work duties.  Time 6    Period Weeks    Status New    Target Date 12/22/20                    Plan - 11/16/20 2203     Clinical Impression Statement Pt presents wtih primary complaint of increased pain in R hip and R elbow. Pt with pain in elbow consistent with lateral epicondylitis, and lateral hip pain consistent with bursitis. Pt with weakness in R arm with shoulder and elbow, as well as in  R hip. Pt with increased pain and diffiuclty with community activities and work duties. Pt to benefit from skilled PT to improve deficits and pain.    Examination-Activity Limitations Lift;Carry;Squat;Stairs    Examination-Participation Restrictions Cleaning;Meal Prep;Yard Work;Community Activity;Occupation;Driving;Laundry;Shop    Stability/Clinical Decision Making Evolving/Moderate complexity    Clinical Decision Making Moderate    Rehab Potential Good    PT Frequency 2x / week    PT Duration 6 weeks    PT Treatment/Interventions Cryotherapy;Electrical Stimulation;Iontophoresis 36m/ml Dexamethasone;ADLs/Self Care Home Management;Moist Heat;Ultrasound;Traction;Therapeutic exercise;Therapeutic activities;Functional mobility training;Gait training;Stair training;Neuromuscular re-education;Patient/family education;Manual techniques;Passive range of motion;Dry needling;Taping;Vasopneumatic Device;Joint Manipulations;Spinal Manipulations    Consulted and Agree with Plan of Care Patient             Patient will benefit from skilled therapeutic intervention in order to improve the following deficits and impairments:  Pain, Increased muscle spasms, Decreased mobility, Decreased activity tolerance, Decreased range of motion, Decreased strength, Impaired flexibility  Visit Diagnosis: Pain in right hip  Pain in right leg  Pain in right elbow     Problem List Patient Active Problem List   Diagnosis Date Noted   GAD (generalized anxiety disorder) 08/30/2018   Vitamin D deficiency 01/18/2018   Hyperlipidemia 11/25/2017   Controlled type 2 diabetes mellitus without complication, without long-term current use of insulin ([facility] [place] 11/10/2017   Acute pericarditis 09/01/2015   S/P laparoscopic assisted vaginal hysterectomy (LAVH) 02/24/2014   Trichomonas infection 11/01/2013   Gonorrhea 11/01/2013   Urinary tract infection 10/28/2013   Acute foot pain 09/11/2013   LLyndee Hensen PT, DPT 10:11  PM  11/16/20   [place] [address]Phone: [phone]  Fax:  [phone] Name: NMERCADIES COMRN: [id number] of Birth: 705-07-1975  PHYSICAL THERAPY DISCHARGE SUMMARY  Visits from Start of Care: 1 Plan: Patient agrees to discharge.  Patient goals were not met. Patient is being discharged due to- not returning since last visit.      LLyndee Hensen PT, DPT 4:19 PM  02/21/22

## 2020-11-19 ENCOUNTER — Encounter: Payer: BC Managed Care – PPO | Admitting: Physical Therapy

## 2020-11-26 ENCOUNTER — Encounter: Payer: BC Managed Care – PPO | Admitting: Physical Therapy

## 2020-12-01 NOTE — Progress Notes (Signed)
I, Christoper Fabian, LAT, ATC, am serving as scribe for Dr. Clementeen Graham.  Renee Knapp is a 45 y.o. female who presents to [facility] today for f/u of R hip/thigh and R elbow pain.  She was last seen by Dr. Denyse Amass on 10/21/20 and was referred to PT of which she has completed 1 session.  She was also prescribed Gabapentin and a short course of prednisone.  Since her last visit, pt reports that her R hip and thigh aren't bothering her as much.  She states that her R elbow pain has not changed.  She states that she has been doing her HEP as prescribed by PT but has not been able to con't PT due to financial constraints.  Her biggest aggravating factor regarding her elbow is w/ gripping and attempting to open things.  Diagnostic testing: L-spine XR- 10/21/20  Pertinent review of systems: No fevers or chills  Relevant historical information: Diabetes   Exam:  BP 130/86 (BP Location: Right Arm, Patient Position: Sitting, Cuff Size: Normal)   Pulse 81   Ht 5\' 4"  (1.626 m)   Wt 207 lb 6.4 oz (94.1 kg)   LMP 02/06/2014 (LMP Unknown)   SpO2 97%   BMI 35.60 kg/m  General: Well Developed, well nourished, and in no acute distress.   MSK: Right elbow normal-appearing tender palpation lateral epicondyle.  Pain with resisted wrist and finger extension.    Lab and Radiology Results  Procedure: Real-time Ultrasound Guided Injection of right lateral epicondyles common extensor tendon origin Device: Philips Affiniti 50G Images permanently stored and available for review in PACS Verbal informed consent obtained.  Discussed risks and benefits of procedure. Warned about infection bleeding damage to structures skin hypopigmentation and fat atrophy among others. Patient expresses understanding and agreement Time-out conducted.   Noted no overlying erythema, induration, or other signs of local infection.   Skin prepped in a sterile fashion.   Local anesthesia: Topical Ethyl  chloride.   With sterile technique and under real time ultrasound guidance: 40 mg of Kenalog and 2 mL of Marcaine injected into the lateral epicondyle. Fluid seen entering the tendon origin.   Completed without difficulty   Pain immediately resolved suggesting accurate placement of the medication.   Advised to call if fevers/chills, erythema, induration, drainage, or persistent bleeding.   Images permanently stored and available for review in the ultrasound unit.  Impression: Technically successful ultrasound guided injection.         Assessment and Plan: 45 y.o. female with right lateral epicondylitis.  Not much improved with home exercise program.  Plan for injection today.  Recheck in about 6 weeks.  Right leg pain due to hip abductor tendinopathy and lumbar radiculopathy improved with PT, home exercise program, and course of prednisone and gabapentin.  Recheck in 6 weeks.   PDMP not reviewed this encounter. Orders Placed This Encounter  Procedures   49 LIMITED JOINT SPACE STRUCTURES UP RIGHT(NO LINKED CHARGES)    Order Specific Question:   Reason for Exam (SYMPTOM  OR DIAGNOSIS REQUIRED)    Answer:   R elbow pain    Order Specific Question:   Preferred imaging location?    Answer:   [facility]   No orders of the defined types were placed in this encounter.    Discussed warning signs or symptoms. Please see discharge instructions. Patient expresses understanding.   The above documentation has been reviewed and is accurate and complete US  Denyse Amass, M.D.

## 2020-12-02 ENCOUNTER — Encounter: Payer: Self-pay | Admitting: Family Medicine

## 2020-12-02 ENCOUNTER — Other Ambulatory Visit: Payer: Self-pay

## 2020-12-02 ENCOUNTER — Ambulatory Visit: Payer: Self-pay

## 2020-12-02 ENCOUNTER — Ambulatory Visit (INDEPENDENT_AMBULATORY_CARE_PROVIDER_SITE_OTHER): Payer: BC Managed Care – PPO | Admitting: Family Medicine

## 2020-12-02 VITALS — BP 130/86 | HR 81 | Ht 64.0 in | Wt 207.4 lb

## 2020-12-02 DIAGNOSIS — M25551 Pain in right hip: Secondary | ICD-10-CM

## 2020-12-02 DIAGNOSIS — M7711 Lateral epicondylitis, right elbow: Secondary | ICD-10-CM | POA: Diagnosis not present

## 2020-12-02 DIAGNOSIS — M5416 Radiculopathy, lumbar region: Secondary | ICD-10-CM | POA: Diagnosis not present

## 2020-12-02 NOTE — Patient Instructions (Signed)
Thank you for coming in today.   Call or go to the ER if you develop a large red swollen joint with extreme pain or oozing puss.    Continue the exercises.   Recheck in 6-8 weeks   Let me know if this is not working. We have more to do .

## 2020-12-03 ENCOUNTER — Encounter: Payer: BC Managed Care – PPO | Admitting: Physical Therapy

## 2020-12-10 ENCOUNTER — Encounter: Payer: BC Managed Care – PPO | Admitting: Physical Therapy

## 2020-12-27 ENCOUNTER — Other Ambulatory Visit: Payer: Self-pay | Admitting: Family Medicine

## 2020-12-28 ENCOUNTER — Other Ambulatory Visit: Payer: Self-pay

## 2020-12-28 ENCOUNTER — Telehealth: Payer: Self-pay

## 2020-12-28 MED ORDER — OZEMPIC (0.25 OR 0.5 MG/DOSE) 2 MG/1.5ML ~~LOC~~ SOPN: 0.5000 mg | PEN_INJECTOR | SUBCUTANEOUS | 1 refills | Status: DC

## 2020-12-28 NOTE — Telephone Encounter (Signed)
Refill sent to pharmacy.   

## 2020-12-28 NOTE — Telephone Encounter (Signed)
..   Encourage patient to contact the pharmacy for refills or they can request refills through [facility]  LAST APPOINTMENT DATE:  10/08/2020 Renee Knapp)  NEXT APPOINTMENT DATE: 04/12/2021 (TOC to Renee Knapp)  MEDICATION: ozempic (states she gets 3 - last script only received 2)  Is the patient out of medication?  Yes, she has been out for 3 weeks  [facility] [address]  Let patient know to contact pharmacy at the end of the day to make sure medication is ready.  Please notify patient to allow 48-72 hours to process  CLINICAL FILLS OUT ALL BELOW:   LAST REFILL:  QTY:  REFILL DATE:    OTHER COMMENTS:   Patient states pharmacy states they have been trying to reach the office    Okay for refill?  Please advise

## 2020-12-28 NOTE — Telephone Encounter (Signed)
Pt is scheduled to see you in Nov for TOC.

## 2020-12-31 ENCOUNTER — Telehealth: Payer: Self-pay

## 2020-12-31 NOTE — Telephone Encounter (Signed)
Pt called stating that she has been out of medication for Ozempic for about 3 weeks now. [facility] and [facility]. She would like a nurse to call her. Please Advise.

## 2020-12-31 NOTE — Telephone Encounter (Signed)
Any other options for pt since Ozempic is on back order?

## 2021-01-04 NOTE — Telephone Encounter (Signed)
Called and lm for pt tcb. 

## 2021-01-04 NOTE — Telephone Encounter (Signed)
Have they [facility] other [facility]ies? We could switch to another medication but it would not be ideal.  Katina Degree. [name], MD 01/04/2021 10:36 AM

## 2021-01-04 NOTE — Telephone Encounter (Signed)
Called and spoke with pt and she states she was able to get her Ozempic yesterday.

## 2021-01-13 ENCOUNTER — Ambulatory Visit: Payer: BC Managed Care – PPO | Admitting: Family Medicine

## 2021-02-01 ENCOUNTER — Telehealth (INDEPENDENT_AMBULATORY_CARE_PROVIDER_SITE_OTHER): Payer: BC Managed Care – PPO | Admitting: Family Medicine

## 2021-02-01 ENCOUNTER — Encounter: Payer: Self-pay | Admitting: Family Medicine

## 2021-02-01 DIAGNOSIS — Z20822 Contact with and (suspected) exposure to covid-19: Secondary | ICD-10-CM | POA: Diagnosis not present

## 2021-02-01 DIAGNOSIS — J069 Acute upper respiratory infection, unspecified: Secondary | ICD-10-CM | POA: Insufficient documentation

## 2021-02-01 DIAGNOSIS — R0982 Postnasal drip: Secondary | ICD-10-CM | POA: Diagnosis not present

## 2021-02-01 DIAGNOSIS — J0110 Acute frontal sinusitis, unspecified: Secondary | ICD-10-CM | POA: Diagnosis not present

## 2021-02-01 DIAGNOSIS — Z03818 Encounter for observation for suspected exposure to other biological agents ruled out: Secondary | ICD-10-CM | POA: Diagnosis not present

## 2021-02-01 NOTE — Assessment & Plan Note (Signed)
Since Friday with covid contacts (of note had covid in may also)  Will do a home test and call us with result  Isolate for now Discussed symptom control including tylenol or ibuprofen and mucinex if helpful  Will watch glucose readings  ER precautions discussed If positive test plan to tx with molnupiravir (since she is diabetic)  Update if not starting to improve in a week or if worsening

## 2021-02-01 NOTE — Progress Notes (Signed)
Virtual Visit via Video Note  I connected with [name] on 02/01/21 at  4:00 PM EDT by a video enabled telemedicine application and verified that I am speaking with the correct person using two identifiers.  Location: Patient: home Provider: office   I discussed the limitations of evaluation and management by telemedicine and the availability of in person appointments. The patient expressed understanding and agreed to proceed.  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 and sent 9 kids home on Friday   Was exposed Friday at work    Covid immunized  Last labs Lab Results  Component Value Date   CREATININE 0.82 10/08/2020   BUN 9 10/08/2020   NA 140 10/08/2020   K 3.9 10/08/2020   CL 102 10/08/2020   CO2 32 10/08/2020    Patient Active Problem List   Diagnosis Date Noted   Viral URI with cough 02/01/2021   GAD (generalized anxiety disorder) 08/30/2018   Vitamin D deficiency 01/18/2018   Hyperlipidemia 11/25/2017   Controlled type 2 diabetes mellitus without complication, without long-term current use of insulin (HCC) 11/10/2017   Acute pericarditis 09/01/2015   S/P laparoscopic assisted vaginal hysterectomy (LAVH) 02/24/2014   Trichomonas infection 11/01/2013   Gonorrhea 11/01/2013   Urinary tract infection 10/28/2013   Acute foot pain 09/11/2013   Past Medical History:  Diagnosis Date   Anemia    History    Diabetes mellitus without complication (HCC)    diet and exercise controlled - no meds   Obesity    Plantar fasciitis, bilateral    History - recvd cortisone injections bilateral- no current prob as 01/2014   SVD (spontaneous vaginal delivery)    x 2   Past Surgical History:  Procedure Laterality Date   CYSTOSCOPY N/A 02/24/2014   Procedure: CYSTOSCOPY;  Surgeon: Essie Hart, MD;  Location: [facility];  Service: Gynecology;  Laterality: N/A;   DILATION AND CURETTAGE OF UTERUS     MAB   LAPAROSCOPIC ASSISTED VAGINAL HYSTERECTOMY N/A 02/24/2014   Procedure: LAPAROSCOPIC ASSISTED VAGINAL HYSTERECTOMY WITH BILATERAL SALPINGECTOMY ;  Surgeon: Essie Hart, MD;  Location: [facility];  Service: Gynecology;  Laterality: N/A;   TUBAL LIGATION     WISDOM TOOTH EXTRACTION     Social History   Tobacco Use   Smoking status: Never   Smokeless tobacco: Never  Vaping Use   Vaping Use: Never used  Substance Use Topics   Alcohol use: No   Drug use: No   Family History  Problem Relation Age of Onset   Cancer Other    Hypertension Other    Diabetes Other    Diabetes Mother    Heart disease Mother    Hypertension Mother    Hypertension Father    High Cholesterol Father    Hypertension Sister    Kidney disease Sister  Stroke Sister    Hypertension Sister    Hearing loss Son    Allergies  Allergen Reactions   Bee Venom Anaphylaxis   Reglan [Metoclopramide] Nausea And Vomiting and Anxiety   Current Outpatient Medications on File Prior to Visit  Medication Sig Dispense Refill   Bayer Microlet Lancets lancets USE 1 SINGLE USE LANCETS AS NEEDED TO CHECK BLOOD SUGAR 100 each 1   CONTOUR NEXT TEST test strip USE 1 LANCETS AS NEEDED TO CHECK BLOOD SUGAR 100 strip 1   EPINEPHrine (EPIPEN 2-PAK) 0.3 mg/0.3 mL IJ SOAJ injection Inject 0.3 mg into the muscle as needed for anaphylaxis. 2 each 1   FLUoxetine (PROZAC) 20 MG tablet TAKE 1 TABLET BY MOUTH EVERY DAY (Patient taking differently: Take 20 mg by  mouth daily as needed (anxiety).) 90 tablet 2   gabapentin (NEURONTIN) 300 MG capsule Take 1 capsule (300 mg total) by mouth 3 (three) times daily as needed. 90 capsule 3   metFORMIN (GLUCOPHAGE-XR) 500 MG 24 hr tablet Take 2 tablets (1,000 mg total) by mouth 2 (two) times daily. 360 tablet 1   ondansetron (ZOFRAN-ODT) 4 MG disintegrating tablet Take 4 mg by mouth every 8 (eight) hours as needed.     rosuvastatin (CRESTOR) 10 MG tablet Take 1 tablet (10 mg total) by mouth daily. 90 tablet 3   Semaglutide,0.25 or 0.5MG /DOS, (OZEMPIC, 0.25 OR 0.5 MG/DOSE,) 2 MG/1.5ML SOPN Inject 0.5 mg into the skin once a week. 4.5 mL 1   Vitamin D, Ergocalciferol, (DRISDOL) 1.25 MG (50000 UNIT) CAPS capsule TAKE 1 CAPSULE BY MOUTH ONCE A WEEK FOR 12 WEEKS THEN OTC 4000 UNITS/DAY 12 capsule 0   Vitamin D, Cholecalciferol, 25 MCG (1000 UT) TABS Take 1,000 Units by mouth daily. gummy (Patient not taking: Reported on 02/01/2021)     No current facility-administered medications on file prior to visit.   Review of Systems  Constitutional:  Positive for fever and malaise/fatigue. Negative for chills.  HENT:  Positive for congestion and sore throat. Negative for ear pain and sinus pain.   Eyes:  Negative for blurred vision, discharge and redness.  Respiratory:  Positive for cough. Negative for sputum production, shortness of breath, wheezing and stridor.   Cardiovascular:  Negative for chest pain, palpitations and leg swelling.  Gastrointestinal:  Negative for abdominal pain, diarrhea, nausea and vomiting.  Musculoskeletal:  Negative for myalgias.  Skin:  Negative for rash.  Neurological:  Positive for headaches. Negative for dizziness.   Observations/Objective: Patient appears well, in no distress, seems fatigued Weight is baseline  No facial swelling or asymmetry Mildly hoarse voice No obvious tremor or mobility impairment Moving neck and UEs normally Able to hear the call well  No cough or shortness of breath  during interview (throat clearing noted) Talkative and mentally sharp with no cognitive changes No skin changes on face or neck , no rash or pallor Affect is normal    Assessment and Plan: Problem List Items Addressed This Visit       Respiratory   Viral URI with cough    Since Friday with covid contacts (of note had covid in may also)  Will do a home test and call us with result  Isolate for now Discussed symptom control including tylenol or ibuprofen and mucinex if helpful  Will watch glucose readings  ER precautions discussed If positive test plan to tx with molnupiravir (since she is diabetic)  Update if not starting to improve in a week or if worsening  Follow Up Instructions: Drink fluids and rest  mucinex DM is good for cough and congestion  Nasal saline for congestion as needed  Tylenol for fever or pain or headache  Please alert [place] if symptoms worsen (if severe or short of breath please go to the ER)   Please call us when you get a covid result  If positive will treat with Molnupiravir    I discussed the assessment and treatment plan with the patient. The patient was provided an opportunity to ask questions and all were answered. The patient agreed with the plan and demonstrated an understanding of the instructions.   The patient was advised to call back or seek an in-person evaluation if the symptoms worsen or if the condition fails to improve as anticipated.     Roxy Manns, MD

## 2021-02-01 NOTE — Patient Instructions (Signed)
Drink fluids and rest  mucinex DM is good for cough and congestion  Nasal saline for congestion as needed  Tylenol for fever or pain or headache  Please alert [place] if symptoms worsen (if severe or short of breath please go to the ER)   Please call us when you get a covid result  If positive will treat with Molnupiravir

## 2021-03-07 ENCOUNTER — Encounter (HOSPITAL_COMMUNITY): Payer: Self-pay

## 2021-03-07 ENCOUNTER — Ambulatory Visit (HOSPITAL_COMMUNITY)
Admission: EM | Admit: 2021-03-07 | Discharge: 2021-03-07 | Disposition: A | Discharge status: Home / Self Care | Payer: BC Managed Care – PPO | Attending: Emergency Medicine | Admitting: Emergency Medicine

## 2021-03-07 ENCOUNTER — Other Ambulatory Visit: Payer: Self-pay

## 2021-03-07 DIAGNOSIS — I10 Essential (primary) hypertension: Secondary | ICD-10-CM | POA: Insufficient documentation

## 2021-03-07 LAB — COMPREHENSIVE METABOLIC PANEL
ALT: 22 U/L (ref 0–44)
AST: 19 U/L (ref 15–41)
Albumin: 4 g/dL (ref 3.5–5.0)
Alkaline Phosphatase: 76 U/L (ref 38–126)
Anion gap: 9 (ref 5–15)
BUN: 7 mg/dL (ref 6–20)
CO2: 29 mmol/L (ref 22–32)
Calcium: 9.9 mg/dL (ref 8.9–10.3)
Chloride: 102 mmol/L (ref 98–111)
Creatinine, Ser: 0.84 mg/dL (ref 0.44–1.00)
GFR, Estimated: >60 mL/min (ref >60)
Glucose, Bld: 89 mg/dL (ref 70–99)
Potassium: 4.2 mmol/L (ref 3.5–5.1)
Sodium: 140 mmol/L (ref 135–145)
Total Bilirubin: 0.6 mg/dL (ref 0.3–1.2)
Total Protein: 8 g/dL (ref 6.5–8.1)

## 2021-03-07 MED ORDER — LISINOPRIL 5 MG PO TABS: 5.0000 mg | ORAL_TABLET | Freq: Every day | ORAL | 1 refills | Status: DC

## 2021-03-07 MED ORDER — CLONIDINE HCL 0.1 MG PO TABS
ORAL_TABLET | ORAL | Status: AC
  Filled 2021-03-07: qty 1

## 2021-03-07 MED ORDER — CLONIDINE HCL 0.1 MG PO TABS
0.1000 mg | ORAL_TABLET | Freq: Once | ORAL | Status: AC
  Administered 2021-03-07: 0.1 mg via ORAL

## 2021-03-07 NOTE — ED Provider Notes (Signed)
[facility]   [id number] 03/07/21 Arrival Time: 1228   Chief Complaint  Patient presents with   Hypertension     SUBJECTIVE: History from: patient.  Renee Knapp is a 45 y.o. female who presented to the urgent care for complaint of elevated blood pressure with dizziness and headache for the past 3 days.  No known history of hypertension.  States blood pressure on average is below 140/80.  For the last 3 days blood pressure has been above 170/90.  Has an appointment with PCP coming up next month.  Denies HA, vision changes, chest pain, shortness of breath, numbness or tingling in extremities, abdominal pain, changes in bowel or bladder habits.      ROS: As per HPI.  All other pertinent ROS negative.      Past Medical History:  Diagnosis Date   Anemia    History   Diabetes mellitus without complication (HCC)    diet and exercise controlled - no meds   Obesity    Plantar fasciitis, bilateral    History - recvd cortisone injections bilateral- no current prob as 01/2014   SVD (spontaneous vaginal delivery)    x 2   Past Surgical History:  Procedure Laterality Date   CYSTOSCOPY N/A 02/24/2014   Procedure: CYSTOSCOPY;  Surgeon: Essie Hart, MD;  Location: [facility];  Service: Gynecology;  Laterality: N/A;   DILATION AND CURETTAGE OF UTERUS     MAB   LAPAROSCOPIC ASSISTED VAGINAL HYSTERECTOMY N/A 02/24/2014   Procedure: LAPAROSCOPIC ASSISTED VAGINAL HYSTERECTOMY WITH BILATERAL SALPINGECTOMY ;  Surgeon: Essie Hart, MD;  Location: [facility];  Service: Gynecology;  Laterality: N/A;   TUBAL LIGATION     WISDOM TOOTH EXTRACTION     Allergies  Allergen Reactions   Bee Venom Anaphylaxis   Reglan [Metoclopramide] Nausea And Vomiting and Anxiety   No current facility-administered medications on file prior to encounter.   Current Outpatient Medications on File Prior to Encounter  Medication Sig Dispense Refill   Bayer Microlet Lancets lancets USE 1 SINGLE USE LANCETS AS  NEEDED TO CHECK BLOOD SUGAR 100 each 1   CONTOUR NEXT TEST test strip USE 1 LANCETS AS NEEDED TO CHECK BLOOD SUGAR 100 strip 1   EPINEPHrine (EPIPEN 2-PAK) 0.3 mg/0.3 mL IJ SOAJ injection Inject 0.3 mg into the muscle as needed for anaphylaxis. 2 each 1   FLUoxetine (PROZAC) 20 MG tablet TAKE 1 TABLET BY MOUTH EVERY DAY (Patient taking differently: Take 20 mg by mouth daily as needed (anxiety).) 90 tablet 2   gabapentin (NEURONTIN) 300 MG capsule Take 1 capsule (300 mg total) by mouth 3 (three) times daily as needed. 90 capsule 3   metFORMIN (GLUCOPHAGE-XR) 500 MG 24 hr tablet Take 2 tablets (1,000 mg total) by mouth 2 (two) times daily. 360 tablet 1   ondansetron (ZOFRAN-ODT) 4 MG disintegrating tablet Take 4 mg by mouth every 8 (eight) hours as needed.     rosuvastatin (CRESTOR) 10 MG tablet Take 1 tablet (10 mg total) by mouth daily. 90 tablet 3   Semaglutide,0.25 or 0.5MG /DOS, (OZEMPIC, 0.25 OR 0.5 MG/DOSE,) 2 MG/1.5ML SOPN Inject 0.5 mg into the skin once a week. 4.5 mL 1   Vitamin D, Cholecalciferol, 25 MCG (1000 UT) TABS Take 1,000 Units by mouth daily. gummy (Patient not taking: Reported on 02/01/2021)     Vitamin D, Ergocalciferol, (DRISDOL) 1.25 MG (50000 UNIT) CAPS capsule TAKE 1 CAPSULE BY MOUTH ONCE A WEEK FOR 12 WEEKS THEN OTC 4000 UNITS/DAY 12  capsule 0   Social History   Socioeconomic History   Marital status: Divorced    Spouse name: Not on file   Number of children: Not on file   Years of education: Not on file   Highest education level: Not on file  Occupational History   Not on file  Tobacco Use   Smoking status: Never   Smokeless tobacco: Never  Vaping Use   Vaping Use: Never used  Substance and Sexual Activity   Alcohol use: No   Drug use: No   Sexual activity: Not Currently    Birth control/protection: Surgical  Other Topics Concern   Not on file  Social History Narrative   Not on file   Social Determinants of Health   Financial Resource Strain: Not on  file  Food Insecurity: Not on file  Transportation Needs: Not on file  Physical Activity: Not on file  Stress: Not on file  Social Connections: Not on file  Intimate Partner Violence: Not on file   Family History  Problem Relation Age of Onset   Cancer Other    Hypertension Other    Diabetes Other    Diabetes Mother    Heart disease Mother    Hypertension Mother    Hypertension Father    High Cholesterol Father    Hypertension Sister    Kidney disease Sister    Stroke Sister    Hypertension Sister    Hearing loss Son     OBJECTIVE:  Vitals:   03/07/21 1402 03/07/21 1517  BP: (!) 153/99 137/85  Pulse: 75   Resp: 17   Temp: 98 F (36.7 C)   TempSrc: Oral   SpO2: 97%      Physical Exam Vitals and nursing note reviewed.  Constitutional:      General: She is not in acute distress.    Appearance: Normal appearance. She is normal weight. She is not ill-appearing, toxic-appearing or diaphoretic.  HENT:     Head: Normocephalic.  Cardiovascular:     Rate and Rhythm: Normal rate and regular rhythm.     Pulses: Normal pulses.     Heart sounds: Normal heart sounds. No murmur heard.   No friction rub. No gallop.  Pulmonary:     Effort: Pulmonary effort is normal. No respiratory distress.     Breath sounds: Normal breath sounds. No stridor. No wheezing, rhonchi or rales.  Chest:     Chest wall: No tenderness.  Neurological:     Mental Status: She is alert and oriented to person, place, and time.     GCS: GCS eye subscore is 4. GCS verbal subscore is 5. GCS motor subscore is 6.     Cranial Nerves: Cranial nerves are intact.     Sensory: Sensation is intact.     Motor: Motor function is intact.     Coordination: Coordination is intact.     LABS:  No results found for this or any previous visit (from the past 24 hour(s)).   ASSESSMENT & PLAN:  1. Essential hypertension     Meds ordered this encounter  Medications   cloNIDine (CATAPRES) tablet 0.1 mg    lisinopril (ZESTRIL) 5 MG tablet    Sig: Take 1 tablet (5 mg total) by mouth daily.    Dispense:  30 tablet    Refill:  1   Patient is stable at discharge.CMP was completed to start this patient on lisinopril.  If liver and kidney function are normal then patient  can continue this med and follow up with PCP as she had an appt coming up.Marland Kitchen  Discharge instructions  Lisinopril was prescribed/take this medication starting tomorrow Please continue to monitor blood pressure at home and keep a log Eat a well balanced diet of fruits, vegetables and lean meats.  Avoid foods high in fat and salt Drink water.  At least half your body weight in ounces Exercise for at least 30 minutes daily Return or go to the ED if you have any new or worsening symptoms such as vision changes, fatigue, dizziness, chest pain, shortness of breath, nausea, swelling in your hands or feet, urinary symptoms, etc...   Reviewed expectations re: course of current medical issues. Questions answered. Outlined signs and symptoms indicating need for more acute intervention. Patient verbalized understanding. After Visit Summary given.          Durward Parcel, FNP 03/07/21 [phone]

## 2021-03-07 NOTE — Discharge Instructions (Addendum)
  Lisinopril was prescribed/take this medication starting tomorrow Please continue to monitor blood pressure at home and keep a log Eat a well balanced diet of fruits, vegetables and lean meats.  Avoid foods high in fat and salt Drink water.  At least half your body weight in ounces Exercise for at least 30 minutes daily Return or go to the ED if you have any new or worsening symptoms such as vision changes, fatigue, dizziness, chest pain, shortness of breath, nausea, swelling in your hands or feet, urinary symptoms, etc..Marland Kitchen

## 2021-03-07 NOTE — ED Triage Notes (Signed)
Pt presents with elevated blood pressure with some dizziness & headache X 3 days.

## 2021-03-08 ENCOUNTER — Emergency Department (HOSPITAL_COMMUNITY): Payer: BC Managed Care – PPO

## 2021-03-08 ENCOUNTER — Other Ambulatory Visit: Payer: Self-pay

## 2021-03-08 ENCOUNTER — Telehealth: Payer: Self-pay

## 2021-03-08 ENCOUNTER — Emergency Department (HOSPITAL_COMMUNITY)
Admission: EM | Admit: 2021-03-08 | Discharge: 2021-03-09 | Disposition: A | Discharge status: Home / Self Care | Payer: BC Managed Care – PPO | Attending: Emergency Medicine | Admitting: Emergency Medicine

## 2021-03-08 DIAGNOSIS — Z79899 Other long term (current) drug therapy: Secondary | ICD-10-CM | POA: Diagnosis not present

## 2021-03-08 DIAGNOSIS — Z7985 Long-term (current) use of injectable non-insulin antidiabetic drugs: Secondary | ICD-10-CM | POA: Diagnosis not present

## 2021-03-08 DIAGNOSIS — R030 Elevated blood-pressure reading, without diagnosis of hypertension: Secondary | ICD-10-CM | POA: Insufficient documentation

## 2021-03-08 DIAGNOSIS — Z7984 Long term (current) use of oral hypoglycemic drugs: Secondary | ICD-10-CM | POA: Insufficient documentation

## 2021-03-08 DIAGNOSIS — Z20822 Contact with and (suspected) exposure to covid-19: Secondary | ICD-10-CM | POA: Diagnosis not present

## 2021-03-08 DIAGNOSIS — E119 Type 2 diabetes mellitus without complications: Secondary | ICD-10-CM | POA: Insufficient documentation

## 2021-03-08 DIAGNOSIS — I10 Essential (primary) hypertension: Secondary | ICD-10-CM | POA: Diagnosis not present

## 2021-03-08 DIAGNOSIS — R079 Chest pain, unspecified: Secondary | ICD-10-CM | POA: Diagnosis not present

## 2021-03-08 LAB — CBC
HCT: 42.4 % (ref 36.0–46.0)
Hemoglobin: 13.7 g/dL (ref 12.0–15.0)
MCH: 27.2 pg (ref 26.0–34.0)
MCHC: 32.3 g/dL (ref 30.0–36.0)
MCV: 84.3 fL (ref 80.0–100.0)
Platelets: 345 10*3/uL (ref 150–400)
RBC: 5.03 MIL/uL (ref 3.87–5.11)
RDW: 13.1 % (ref 11.5–15.5)
WBC: 9.9 10*3/uL (ref 4.0–10.5)
nRBC: 0 % (ref 0.0–0.2)

## 2021-03-08 LAB — TROPONIN I (HIGH SENSITIVITY)
Troponin I (High Sensitivity): 6 ng/L (ref <18)
Troponin I (High Sensitivity): 4 ng/L (ref <18)

## 2021-03-08 LAB — BASIC METABOLIC PANEL
Anion gap: 10 (ref 5–15)
BUN: 11 mg/dL (ref 6–20)
CO2: 28 mmol/L (ref 22–32)
Calcium: 9.6 mg/dL (ref 8.9–10.3)
Chloride: 100 mmol/L (ref 98–111)
Creatinine, Ser: 0.84 mg/dL (ref 0.44–1.00)
GFR, Estimated: >60 mL/min (ref >60)
Glucose, Bld: 107 mg/dL — ABNORMAL HIGH (ref 70–99)
Potassium: 3.9 mmol/L (ref 3.5–5.1)
Sodium: 138 mmol/L (ref 135–145)

## 2021-03-08 LAB — CBG MONITORING, ED: Glucose-Capillary: 75 mg/dL (ref 70–99)

## 2021-03-08 LAB — I-STAT BETA HCG BLOOD, ED (MC, WL, AP ONLY): I-stat hCG, quantitative: <5 m[IU]/mL (ref <5)

## 2021-03-08 NOTE — Telephone Encounter (Signed)
Nurse Assessment Nurse: Freida Busman, RN, Diane Date/Time Lamount Cohen Time): 03/08/2021 11:00:22 AM Confirm and document reason for call. If symptomatic, describe symptoms. ---Caller states she was seen at Urgent Care yesterday diagnosed with high blood pressure. Prescribed something to lower BP. Still not feeling well, current BP is 135/107. Has headache and feeling like everything is in slow motion. Does the patient have any new or worsening symptoms? ---Yes Will a triage be completed? ---Yes Related visit to physician within the last 2 weeks? ---Yes Does the PT have any chronic conditions? (i.e. diabetes, asthma, this includes High risk factors for pregnancy, etc.) ---Yes List chronic conditions. ---HTN, DM Is the patient pregnant or possibly pregnant? (Ask all females between the ages of 38-55) ---No Is this a behavioral health or substance abuse call? ---No PLEASE NOTE: All timestamps contained within this report are represented as [place] Standard Time. CONFIDENTIALTY NOTICE: This fax transmission is intended only for the addressee. It contains information that is legally privileged, confidential or otherwise protected from use or disclosure. If you are not the intended recipient, you are strictly prohibited from reviewing, disclosing, copying using or disseminating any of this information or taking any action in reliance on or regarding this information. If you have received this fax in error, please notify us immediately by telephone so that we can arrange for its return to [place]. Phone: [phone], Toll-Free: [phone], Fax: [phone] Page: 2 of 2 Call Id: [id number] Guidelines Guideline Title Affirmed Question Affirmed Notes Nurse Date/Time Lamount Cohen Time) Blood Pressure - High [1] Systolic BP >= 160 OR Diastolic >= 100 AND [2] cardiac or neurologic symptoms (e.g., chest pain, difficulty breathing, unsteady gait, blurred vision) Freida Busman, RN, Diane 03/08/2021  11:02:20 AM Disp. Time Lamount Cohen Time) Disposition Final User 03/08/2021 10:33:53 AM Attempt made - message left Freida Busman, RN, Diane 03/08/2021 10:50:57 AM Attempt made - message left Freida Busman, RN, Diane 03/08/2021 11:06:33 AM Go to ED Now Yes Freida Busman, RN, Diane Caller Disagree/Comply Comply Caller Understands Yes PreDisposition Did not know what to do Care Advice Given Per Guideline GO TO ED NOW: * You need to be seen in the Emergency Department. NOTE TO TRIAGER - DRIVING: * Another adult should drive. CALL EMS 911 IF: * Passes out or faints * Becomes too weak to stand * Becomes confused CARE ADVICE given per High Blood Pressure (Adult) guideline. Comments User: Matt Holmes, RN Date/Time Lamount Cohen Time): 03/08/2021 11:01:43 AM BS in the 87-96 User: Matt Holmes, RN Date/Time Lamount Cohen Time): 03/08/2021 11:04:30 AM Larey Seat Friday and yesterday due to dizziness Referrals [facility] - ED

## 2021-03-08 NOTE — ED Triage Notes (Addendum)
Patient arrives with complaints of high blood pressure (highest 172 systolic at home) x3 days headache, and chest/shoulder/back discomfort. Patient alert and oriented x4 on arrival. Patient was seen at urgent care for same yesterday, but still doesn't feel well.

## 2021-03-08 NOTE — Telephone Encounter (Signed)
Patient called in stating that her BP was 135/107.  States she was having headaches, chest pain, breathing concerns that she believes is anxiety and felt like everything was going in slow motion.  States she was seen at [facility] yesterday and was advised to follow up with our office today.  I have sent patient to [facility] for triage.

## 2021-03-08 NOTE — ED Provider Notes (Signed)
Emergency Medicine Provider Triage Evaluation Note  Renee Knapp , a 45 y.o. female  was evaluated in triage.  Pt complains of hypertension, headache x3 days. Further c/o a fb sensation in the chest. [facility] and was given rx for htn meds but has not taken them. Bp normal today but sxs persist.  Review of Systems  Positive: Htn, ha, fb in chest Negative: Sob/cp  Physical Exam  BP 125/87 (BP Location: Left Arm)   Pulse (!) 107   Temp 98.4 F (36.9 C) (Oral)   Resp 15   Ht 5\' 4"  (1.626 m)   Wt 93 kg   LMP 02/06/2014 (LMP Unknown)   SpO2 98%   BMI 35.19 kg/m  Gen:   Awake, no distress   Resp:  Normal effort  MSK:   Moves extremities without difficulty  Other:  Heart with rrr, lungs ctab  Medical Decision Making  Medically screening exam initiated at 1:18 PM.  Appropriate orders placed.  PHOENICIA PIRIE was informed that the remainder of the evaluation will be completed by another provider, this initial triage assessment does not replace that evaluation, and the importance of remaining in the ED until their evaluation is complete.     [name] 03/08/21 1318    03/10/21, MD 03/08/21 2039

## 2021-03-08 NOTE — Telephone Encounter (Signed)
FYI

## 2021-03-09 LAB — RESP PANEL BY RT-PCR (FLU A&B, COVID) ARPGX2
Influenza A by PCR: NEGATIVE
Influenza B by PCR: NEGATIVE
SARS Coronavirus 2 by RT PCR: NEGATIVE

## 2021-03-09 NOTE — Discharge Instructions (Signed)
Return to the emergency department for new or worsening chest pain, shortness of breath, syncope.  You can also return for any new symptoms that you find concerning.  Otherwise, please follow-up with your primary care doctor.

## 2021-03-09 NOTE — ED Provider Notes (Signed)
[facility] EMERGENCY DEPARTMENT Provider Note   CSN: [id number] Arrival date & time: 03/08/21  1153     History Chief Complaint  Patient presents with   Hypertension    Renee Knapp is a 45 y.o. female.  Patient presents to the emergency department with a chief complaint of high blood pressure.  She states that her blood pressures have been higher for the past couple of days.  She was seen 2 days ago at urgent care and was prescribed lisinopril.  She states that she has not started taking it yet.  She reports that she continues to feel poorly.  She reports an associated sensation in her chest that feels like a knot in her chest.  She denies any difficulty with eating or drinking.  Denies any nausea or vomiting.  Denies any burning sensation in her chest.  She denies any fever, chills, or cough.  She states that she talk to her doctor yesterday, and was advised to go to the emergency department.  Additionally, she reports that she has had a headache for the past few days.  The history is provided by the patient. No language interpreter was used.      Past Medical History:  Diagnosis Date   Anemia    History   Diabetes mellitus without complication (HCC)    diet and exercise controlled - no meds   Obesity    Plantar fasciitis, bilateral    History - recvd cortisone injections bilateral- no current prob as 01/2014   SVD (spontaneous vaginal delivery)    x 2    Patient Active Problem List   Diagnosis Date Noted   Viral URI with cough 02/01/2021   GAD (generalized anxiety disorder) 08/30/2018   Vitamin D deficiency 01/18/2018   Hyperlipidemia 11/25/2017   Controlled type 2 diabetes mellitus without complication, without long-term current use of insulin (HCC) 11/10/2017   Acute pericarditis 09/01/2015   S/P laparoscopic assisted vaginal hysterectomy (LAVH) 02/24/2014   Trichomonas infection 11/01/2013   Gonorrhea 11/01/2013   Urinary tract infection  10/28/2013   Acute foot pain 09/11/2013    Past Surgical History:  Procedure Laterality Date   CYSTOSCOPY N/A 02/24/2014   Procedure: CYSTOSCOPY;  Surgeon: Essie Hart, MD;  Location: [facility];  Service: Gynecology;  Laterality: N/A;   DILATION AND CURETTAGE OF UTERUS     MAB   LAPAROSCOPIC ASSISTED VAGINAL HYSTERECTOMY N/A 02/24/2014   Procedure: LAPAROSCOPIC ASSISTED VAGINAL HYSTERECTOMY WITH BILATERAL SALPINGECTOMY ;  Surgeon: Essie Hart, MD;  Location: [facility];  Service: Gynecology;  Laterality: N/A;   TUBAL LIGATION     WISDOM TOOTH EXTRACTION       OB History     Gravida  3   Para      Term      Preterm      AB  1   Living  2      SAB  1   IAB      Ectopic      Multiple      Live Births              Family History  Problem Relation Age of Onset   Cancer Other    Hypertension Other    Diabetes Other    Diabetes Mother    Heart disease Mother    Hypertension Mother    Hypertension Father    High Cholesterol Father    Hypertension Sister    Kidney disease Sister  Stroke Sister    Hypertension Sister    Hearing loss Son     Social History   Tobacco Use   Smoking status: Never   Smokeless tobacco: Never  Vaping Use   Vaping Use: Never used  Substance Use Topics   Alcohol use: No   Drug use: No    Home Medications Prior to Admission medications   Medication Sig Start Date End Date Taking? Authorizing Advertising account planner lancets USE 1 SINGLE USE LANCETS AS NEEDED TO CHECK BLOOD SUGAR 04/24/19   [name], MD  CONTOUR NEXT TEST test strip USE 1 LANCETS AS NEEDED TO CHECK BLOOD SUGAR 03/30/20   [name], MD  EPINEPHrine (EPIPEN 2-PAK) 0.3 mg/0.3 mL IJ SOAJ injection Inject 0.3 mg into the muscle as needed for anaphylaxis. 10/08/20   [name], MD  FLUoxetine (PROZAC) 20 MG tablet TAKE 1 TABLET BY MOUTH EVERY DAY Patient taking differently: Take 20 mg by mouth daily as needed (anxiety). 04/27/20   [name], MD   gabapentin (NEURONTIN) 300 MG capsule Take 1 capsule (300 mg total) by mouth 3 (three) times daily as needed. 10/21/20   Rodolph Bong, MD  lisinopril (ZESTRIL) 5 MG tablet Take 1 tablet (5 mg total) by mouth daily. 03/07/21   Avegno, Zachery Dakins, FNP  metFORMIN (GLUCOPHAGE-XR) 500 MG 24 hr tablet Take 2 tablets (1,000 mg total) by mouth 2 (two) times daily. 10/08/20   [name], MD  ondansetron (ZOFRAN-ODT) 4 MG disintegrating tablet Take 4 mg by mouth every 8 (eight) hours as needed. 09/30/20   [provider]  rosuvastatin (CRESTOR) 10 MG tablet Take 1 tablet (10 mg total) by mouth daily. 10/12/20   [name], MD  Semaglutide,0.25 or 0.5MG /DOS, (OZEMPIC, 0.25 OR 0.5 MG/DOSE,) 2 MG/1.5ML SOPN Inject 0.5 mg into the skin once a week. 12/28/20   Ardith Dark, MD  Vitamin D, Cholecalciferol, 25 MCG (1000 UT) TABS Take 1,000 Units by mouth daily. gummy Patient not taking: Reported on 02/01/2021    [provider]  Vitamin D, Ergocalciferol, (DRISDOL) 1.25 MG (50000 UNIT) CAPS capsule TAKE 1 CAPSULE BY MOUTH ONCE A WEEK FOR 12 WEEKS THEN OTC 4000 UNITS/DAY 12/29/20   Ardith Dark, MD    Allergies    Bee venom and Reglan [metoclopramide]  Review of Systems   Review of Systems  All other systems reviewed and are negative.  Physical Exam Updated Vital Signs BP (!) 139/96   Pulse 82   Temp 97.9 F (36.6 C) (Oral)   Resp 16   Ht 5\' 4"  (1.626 m)   Wt 93 kg   LMP 02/06/2014 (LMP Unknown)   SpO2 100%   BMI 35.19 kg/m   Physical Exam Vitals and nursing note reviewed.  Constitutional:      General: She is not in acute distress.    Appearance: She is well-developed.  HENT:     Head: Normocephalic and atraumatic.  Eyes:     Conjunctiva/sclera: Conjunctivae normal.  Cardiovascular:     Rate and Rhythm: Normal rate and regular rhythm.     Heart sounds: No murmur heard. Pulmonary:     Effort: Pulmonary effort is normal. No respiratory distress.     Breath sounds:  Normal breath sounds.  Abdominal:     Palpations: Abdomen is soft.     Tenderness: There is no abdominal tenderness.  Musculoskeletal:        General: Normal range of motion.  Cervical back: Neck supple.  Skin:    General: Skin is warm and dry.  Neurological:     Mental Status: She is alert and oriented to person, place, and time.  Psychiatric:        Mood and Affect: Mood normal.        Behavior: Behavior normal.    ED Results / Procedures / Treatments   Labs (all labs ordered are listed, but only abnormal results are displayed) Labs Reviewed  BASIC METABOLIC PANEL - Abnormal; Notable for the following components:      Result Value   Glucose, Bld 107 (*)    All other components within normal limits  RESP PANEL BY RT-PCR (FLU A&B, COVID) ARPGX2  CBC  I-STAT BETA HCG BLOOD, ED ([facility], [facility], [facility] ONLY)  CBG MONITORING, ED  TROPONIN I (HIGH SENSITIVITY)  TROPONIN I (HIGH SENSITIVITY)    EKG EKG Interpretation  Date/Time:  Monday March 08 2021 12:56:13 EDT Ventricular Rate:  92 PR Interval:  144 QRS Duration: 72 QT Interval:  336 QTC Calculation: 415 R Axis:   28 Text Interpretation: Normal sinus rhythm Normal ECG Confirmed by Marily Memos [phone]) on 03/09/2021 12:30:32 AM  Radiology DG Chest 2 View  Result Date: 03/08/2021 CLINICAL DATA:  Chest pain EXAM: CHEST - 2 VIEW COMPARISON:  Chest radiograph 04/04/2018 FINDINGS: The cardiomediastinal silhouette is normal. The lungs are clear, without focal consolidation or pulmonary edema. There is no pleural effusion or pneumothorax. There is no acute osseous abnormality. IMPRESSION: No radiographic evidence of acute cardiopulmonary process. Electronically Signed   By: Lesia Hausen M.D.   On: 03/08/2021 14:26    Procedures Procedures   Medications Ordered in ED Medications - No data to display  ED Course  I have reviewed the triage vital signs and the nursing notes.  Pertinent labs & imaging results that were available  during my care of the patient were reviewed by me and considered in my medical decision making (see chart for details).    MDM Rules/Calculators/A&P                           Patient here with elevated blood pressures.  Patient also reports a "knot" sensation in her chest.  Chest x-ray negative.  Troponin is 6 and 4.  EKG is normal, no ischemic changes.  Doubt ACS.  Patient has been prescribed lisinopril by urgent care, but has not begun taking it yet.  I think this would be beneficial for the patient's blood pressure and have advised her to begin taking this as directed by the urgent care.  I have advised patient to follow-up with her primary care doctor.  COVID test is still pending.  We will notify patient of any positive results, but I do not think patient needs to continue to wait for this test tonight.  She is not having any symptoms of COVID. Final Clinical Impression(s) / ED Diagnoses Final diagnoses:  Elevated blood pressure reading    Rx / DC Orders ED Discharge Orders     None        Roxy Horseman, PA-C 03/09/21 0344    Mesner, Barbara Cower, MD 03/09/21 [phone]

## 2021-03-10 ENCOUNTER — Encounter: Payer: Self-pay | Admitting: Family

## 2021-03-10 ENCOUNTER — Ambulatory Visit (INDEPENDENT_AMBULATORY_CARE_PROVIDER_SITE_OTHER): Payer: BC Managed Care – PPO | Admitting: Family

## 2021-03-10 VITALS — BP 135/82 | HR 80 | Temp 98.5°F | Ht 64.0 in | Wt 205.6 lb

## 2021-03-10 DIAGNOSIS — I152 Hypertension secondary to endocrine disorders: Secondary | ICD-10-CM | POA: Insufficient documentation

## 2021-03-10 DIAGNOSIS — E1159 Type 2 diabetes mellitus with other circulatory complications: Secondary | ICD-10-CM

## 2021-03-10 MED ORDER — IRBESARTAN 75 MG PO TABS
75.0000 mg | ORAL_TABLET | Freq: Every day | ORAL | 0 refills | Status: DC
Start: 2021-03-10 — End: 2021-04-01

## 2021-03-10 NOTE — Assessment & Plan Note (Signed)
Pt reports nausea and increased fatigue since starting Lisinopril yesterday. Advised to take qhs, but pt prefers to switch meds. Advised she may feel same SE, needs to give a few days for her body to adjust. Prefer Losartan, but ins. prefers Irbesartan.

## 2021-03-10 NOTE — Progress Notes (Signed)
New Patient Office Visit  Subjective:  Patient ID: Renee Knapp, female    DOB: January 18, 1976  Age: 45 y.o. MRN: [id number]  CC:  Chief Complaint  Patient presents with   Hypertension    Pt says that her BP has been high lately.   Dizziness    She describe this as being wobbly. 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    Fall    Recently fell twice. Friday and Sunday.    Headache    Starting from the forehead to the back of the head.     HPI Renee Knapp presents for HTN. Patient is currently maintained on the following medications for blood pressure: Lisinopril Failed meds include none. Patient reports good compliance with blood pressure medications (just started med yesterday). Patient denies chest pain, shortness of breath or swelling. Reports chest tightness and overall not feeling well. Seen in ER on Monday for symptoms, BP readings at home have been 140s/90s and in ER 172/110. Started Lisinopril yesterday, but reports feeling nauseus and more fatigue than before. Last 3 blood pressure readings in our office are as follows: BP Readings from Last 3 Encounters:  03/10/21 135/82  03/09/21 134/84  03/07/21 137/85     Past Medical History:  Diagnosis Date   Anemia    History   Diabetes mellitus without complication (HCC)    diet and exercise controlled - no meds   Obesity    Plantar fasciitis, bilateral    History - recvd cortisone injections bilateral- no current prob as 01/2014   SVD (spontaneous vaginal delivery)    x 2    Past Surgical History:  Procedure Laterality Date   CYSTOSCOPY N/A 02/24/2014   Procedure: CYSTOSCOPY;  Surgeon: Essie Hart, MD;  Location: [facility];  Service: Gynecology;  Laterality: N/A;   DILATION AND CURETTAGE OF UTERUS     MAB   LAPAROSCOPIC ASSISTED VAGINAL HYSTERECTOMY N/A 02/24/2014   Procedure: LAPAROSCOPIC ASSISTED VAGINAL HYSTERECTOMY WITH BILATERAL SALPINGECTOMY ;  Surgeon: Essie Hart, MD;   Location: [facility];  Service: Gynecology;  Laterality: N/A;   TUBAL LIGATION     WISDOM TOOTH EXTRACTION      Family History  Problem Relation Age of Onset   Cancer Other    Hypertension Other    Diabetes Other    Diabetes Mother    Heart disease Mother    Hypertension Mother    Hypertension Father    High Cholesterol Father    Hypertension Sister    Kidney disease Sister    Stroke Sister    Hypertension Sister    Hearing loss Son     Social History   Socioeconomic History   Marital status: Divorced    Spouse name: Not on file   Number of children: Not on file   Years of education: Not on file   Highest education level: Not on file  Occupational History   Not on file  Tobacco Use   Smoking status: Never   Smokeless tobacco: Never  Vaping Use   Vaping Use: Never used  Substance and Sexual Activity   Alcohol use: No   Drug use: No   Sexual activity: Not Currently    Birth control/protection: Surgical  Other Topics Concern   Not on file  Social History Narrative   Not on file   Social Determinants of Health   Financial Resource Strain: Not on file  Food Insecurity: Not on file  Transportation Needs: Not on file  Physical Activity: Not on file  Stress: Not on file  Social Connections: Not on file  Intimate Partner Violence: Not on file    Objective:   Today's Vitals: BP 135/82   Pulse 80   Temp 98.5 F (36.9 C) (Temporal)   Ht 5\' 4"  (1.626 m)   Wt 205 lb 9.6 oz (93.3 kg)   LMP 02/06/2014 (LMP Unknown)   SpO2 100%   BMI 35.29 kg/m   Physical Exam Vitals and nursing note reviewed.  Constitutional:      Appearance: Normal appearance.  Cardiovascular:     Rate and Rhythm: Normal rate and regular rhythm.  Pulmonary:     Effort: Pulmonary effort is normal.     Breath sounds: Normal breath sounds.  Musculoskeletal:        General: Normal range of motion.  Skin:    General: Skin is warm and dry.  Neurological:     Mental Status: She is alert.   Psychiatric:        Mood and Affect: Mood normal.        Behavior: Behavior normal.    Assessment & Plan:   Problem List Items Addressed This Visit       Cardiovascular and Mediastinum   Hypertension associated with diabetes (HCC) - Primary    Pt reports nausea and increased fatigue since starting Lisinopril yesterday. Advised to take qhs, but pt prefers to switch meds. Advised she may feel same SE, needs to give a few days for her body to adjust. Prefer Losartan, but ins. prefers Irbesartan.      Relevant Medications   irbesartan (AVAPRO) 75 MG tablet    Outpatient Encounter Medications as of 03/10/2021  Medication Sig   irbesartan (AVAPRO) 75 MG tablet Take 1 tablet (75 mg total) by mouth daily.   Bayer Microlet Lancets lancets USE 1 SINGLE USE LANCETS AS NEEDED TO CHECK BLOOD SUGAR   CONTOUR NEXT TEST test strip USE 1 LANCETS AS NEEDED TO CHECK BLOOD SUGAR   EPINEPHrine (EPIPEN 2-PAK) 0.3 mg/0.3 mL IJ SOAJ injection Inject 0.3 mg into the muscle as needed for anaphylaxis.   FLUoxetine (PROZAC) 20 MG tablet TAKE 1 TABLET BY MOUTH EVERY DAY (Patient taking differently: Take 20 mg by mouth daily as needed (anxiety).)   gabapentin (NEURONTIN) 300 MG capsule Take 1 capsule (300 mg total) by mouth 3 (three) times daily as needed.   metFORMIN (GLUCOPHAGE-XR) 500 MG 24 hr tablet Take 2 tablets (1,000 mg total) by mouth 2 (two) times daily.   ondansetron (ZOFRAN-ODT) 4 MG disintegrating tablet Take 4 mg by mouth every 8 (eight) hours as needed.   rosuvastatin (CRESTOR) 10 MG tablet Take 1 tablet (10 mg total) by mouth daily.   Semaglutide,0.25 or 0.5MG /DOS, (OZEMPIC, 0.25 OR 0.5 MG/DOSE,) 2 MG/1.5ML SOPN Inject 0.5 mg into the skin once a week.   Vitamin D, Cholecalciferol, 25 MCG (1000 UT) TABS Take 1,000 Units by mouth daily. gummy (Patient not taking: Reported on 02/01/2021)   Vitamin D, Ergocalciferol, (DRISDOL) 1.25 MG (50000 UNIT) CAPS capsule TAKE 1 CAPSULE BY MOUTH ONCE A WEEK FOR  12 WEEKS THEN OTC 4000 UNITS/DAY   [DISCONTINUED] lisinopril (ZESTRIL) 5 MG tablet Take 1 tablet (5 mg total) by mouth daily.   No facility-administered encounter medications on file as of 03/10/2021.    Follow-up: Return in about 4 weeks (around 04/07/2021) for follow up with Dr. 04/09/2021.   [name], NP

## 2021-03-10 NOTE — Patient Instructions (Signed)
It was very nice to see you today!  I have sent a new blood pressure med to your pharmacy. Start this today. Please allow a few days for your body to adjust to the medication before stopping. If severe dizziness, nausea or HA stop med and call the office.   PLEASE NOTE:  If you had any lab tests please let us know if you have not heard back within a few days. You may see your results on mychart before we have a chance to review them but we will give you a call once they are reviewed by [place]. If we ordered any referrals today, please let us know if you have not heard from their office within the next week.   Please try these tips to maintain a healthy lifestyle:  Eat most of your calories during the day when you are active. Eliminate processed foods including packaged sweets (pies, cakes, cookies), reduce intake of potatoes, white bread, white pasta, and white rice. Look for whole grain options, oat flour or almond flour.  Each meal should contain half fruits/vegetables, one quarter protein, and one quarter carbs (no bigger than a computer mouse).  Cut down on sweet beverages. This includes juice, soda, and sweet tea. Also watch fruit intake, though this is a healthier sweet option, it still contains natural sugar! Limit to 3 servings daily.  Drink at least 1 glass of water with each meal and aim for at least 8 glasses per day  Exercise at least 150 minutes every week.

## 2021-03-21 ENCOUNTER — Other Ambulatory Visit: Payer: Self-pay | Admitting: Family Medicine

## 2021-03-25 ENCOUNTER — Other Ambulatory Visit: Payer: Self-pay | Admitting: Family Medicine

## 2021-03-28 ENCOUNTER — Other Ambulatory Visit: Payer: Self-pay | Admitting: Family Medicine

## 2021-04-01 ENCOUNTER — Other Ambulatory Visit: Payer: Self-pay | Admitting: Family

## 2021-04-01 ENCOUNTER — Other Ambulatory Visit: Payer: Self-pay

## 2021-04-01 DIAGNOSIS — I152 Hypertension secondary to endocrine disorders: Secondary | ICD-10-CM

## 2021-04-04 ENCOUNTER — Ambulatory Visit (HOSPITAL_COMMUNITY)
Admission: EM | Admit: 2021-04-04 | Discharge: 2021-04-04 | Disposition: A | Discharge status: Home / Self Care | Payer: BC Managed Care – PPO | Attending: Medical Oncology | Admitting: Medical Oncology

## 2021-04-04 ENCOUNTER — Encounter (HOSPITAL_COMMUNITY): Payer: Self-pay | Admitting: Emergency Medicine

## 2021-04-04 DIAGNOSIS — J069 Acute upper respiratory infection, unspecified: Secondary | ICD-10-CM

## 2021-04-04 DIAGNOSIS — Z20822 Contact with and (suspected) exposure to covid-19: Secondary | ICD-10-CM | POA: Diagnosis not present

## 2021-04-04 DIAGNOSIS — H9203 Otalgia, bilateral: Secondary | ICD-10-CM | POA: Diagnosis not present

## 2021-04-04 DIAGNOSIS — R051 Acute cough: Secondary | ICD-10-CM | POA: Insufficient documentation

## 2021-04-04 HISTORY — DX: Essential (primary) hypertension: I10

## 2021-04-04 LAB — POC INFLUENZA A AND B ANTIGEN (URGENT CARE ONLY)
INFLUENZA A ANTIGEN, POC: NEGATIVE
INFLUENZA B ANTIGEN, POC: NEGATIVE

## 2021-04-04 MED ORDER — BENZONATATE 200 MG PO CAPS: 200.0000 mg | ORAL_CAPSULE | Freq: Three times a day (TID) | ORAL | 0 refills | Status: DC | PRN

## 2021-04-04 MED ORDER — FLUTICASONE PROPIONATE 50 MCG/ACT NA SUSP: 2.0000 | Freq: Every day | NASAL | 0 refills | Status: DC

## 2021-04-04 MED ORDER — BENZONATATE 100 MG PO CAPS: 100.0000 mg | ORAL_CAPSULE | Freq: Three times a day (TID) | ORAL | 0 refills | Status: DC

## 2021-04-04 NOTE — ED Provider Notes (Signed)
[facility]    CSN: [id number] Arrival date & time: 04/04/21  1458      History   Chief Complaint Chief Complaint  Patient presents with   Cough   Otalgia   Generalized Body Aches    HPI Renee Knapp is a 45 y.o. female.   HPI  Cold Symptoms: Pt reports that since yesterday she has had dry cough, ear pain bilaterally, body aches. Subjective fever. No known sick contacts. She has tried Theraflu for symptoms with slight improvement. No SOB, wheezing, vomiting.   Past Medical History:  Diagnosis Date   Anemia    History   Diabetes mellitus without complication (HCC)    diet and exercise controlled - no meds   Hypertension    Obesity    Plantar fasciitis, bilateral    History - recvd cortisone injections bilateral- no current prob as 01/2014   SVD (spontaneous vaginal delivery)    x 2    Patient Active Problem List   Diagnosis Date Noted   Hypertension associated with diabetes (HCC) 03/10/2021   Viral URI with cough 02/01/2021   GAD (generalized anxiety disorder) 08/30/2018   Vitamin D deficiency 01/18/2018   Hyperlipidemia 11/25/2017   Controlled type 2 diabetes mellitus without complication, without long-term current use of insulin (HCC) 11/10/2017   Acute pericarditis 09/01/2015   S/P laparoscopic assisted vaginal hysterectomy (LAVH) 02/24/2014   Trichomonas infection 11/01/2013   Gonorrhea 11/01/2013   Urinary tract infection 10/28/2013   Acute foot pain 09/11/2013    Past Surgical History:  Procedure Laterality Date   CYSTOSCOPY N/A 02/24/2014   Procedure: CYSTOSCOPY;  Surgeon: Essie Hart, MD;  Location: [facility];  Service: Gynecology;  Laterality: N/A;   DILATION AND CURETTAGE OF UTERUS     MAB   LAPAROSCOPIC ASSISTED VAGINAL HYSTERECTOMY N/A 02/24/2014   Procedure: LAPAROSCOPIC ASSISTED VAGINAL HYSTERECTOMY WITH BILATERAL SALPINGECTOMY ;  Surgeon: Essie Hart, MD;  Location: [facility];  Service: Gynecology;  Laterality: N/A;   TUBAL LIGATION      WISDOM TOOTH EXTRACTION      OB History     Gravida  3   Para      Term      Preterm      AB  1   Living  2      SAB  1   IAB      Ectopic      Multiple      Live Births               Home Medications    Prior to Admission medications   Medication Sig Start Date End Date Taking? Authorizing Advertising account planner lancets USE 1 SINGLE USE LANCETS AS NEEDED TO CHECK BLOOD SUGAR 04/24/19   [name], MD  CONTOUR NEXT TEST test strip USE 1 LANCETS AS NEEDED TO CHECK BLOOD SUGAR 03/30/20   [name], MD  EPINEPHrine (EPIPEN 2-PAK) 0.3 mg/0.3 mL IJ SOAJ injection Inject 0.3 mg into the muscle as needed for anaphylaxis. 10/08/20   [name], MD  FLUoxetine (PROZAC) 20 MG tablet TAKE 1 TABLET BY MOUTH EVERY DAY Patient taking differently: Take 20 mg by mouth daily as needed (anxiety). 04/27/20   [name], MD  gabapentin (NEURONTIN) 300 MG capsule Take 1 capsule (300 mg total) by mouth 3 (three) times daily as needed. 10/21/20   Rodolph Bong, MD  irbesartan (AVAPRO) 75 MG tablet TAKE 1 TABLET BY MOUTH EVERY DAY 04/01/21  Jeanie Sewer, NP  metFORMIN (GLUCOPHAGE-XR) 500 MG 24 hr tablet Take 2 tablets (1,000 mg total) by mouth 2 (two) times daily. 10/08/20   Orma Flaming, MD  ondansetron (ZOFRAN-ODT) 4 MG disintegrating tablet Take 4 mg by mouth every 8 (eight) hours as needed. 09/30/20   [provider]  rosuvastatin (CRESTOR) 10 MG tablet Take 1 tablet (10 mg total) by mouth daily. 10/12/20   Orma Flaming, MD  Semaglutide,0.25 or 0.5MG /DOS, (OZEMPIC, 0.25 OR 0.5 MG/DOSE,) 2 MG/1.5ML SOPN Inject 0.5 mg into the skin once a week. 12/28/20   Vivi Barrack, MD  Vitamin D, Cholecalciferol, 25 MCG (1000 UT) TABS Take 1,000 Units by mouth daily. gummy Patient not taking: Reported on 02/01/2021    [provider]  Vitamin D, Ergocalciferol, (DRISDOL) 1.25 MG (50000 UNIT) CAPS capsule TAKE 1 CAPSULE BY MOUTH ONCE A WEEK FOR 12  WEEKS THEN OTC 4000 UNITS/DAY 12/29/20   Vivi Barrack, MD    Family History Family History  Problem Relation Age of Onset   Cancer Other    Hypertension Other    Diabetes Other    Diabetes Mother    Heart disease Mother    Hypertension Mother    Hypertension Father    High Cholesterol Father    Hypertension Sister    Kidney disease Sister    Stroke Sister    Hypertension Sister    Hearing loss Son     Social History Social History   Tobacco Use   Smoking status: Never   Smokeless tobacco: Never  Vaping Use   Vaping Use: Never used  Substance Use Topics   Alcohol use: No   Drug use: No     Allergies   Bee venom and Reglan [metoclopramide]   Review of Systems Review of Systems  As stated above in HPI Physical Exam Triage Vital Signs ED Triage Vitals  Enc Vitals Group     BP 04/04/21 1604 135/83     Pulse Rate 04/04/21 1604 99     Resp 04/04/21 1604 18     Temp 04/04/21 1604 99.2 F (37.3 C)     Temp Source 04/04/21 1604 Oral     SpO2 04/04/21 1604 98 %     Weight --      Height --      Head Circumference --      Peak Flow --      Pain Score 04/04/21 1601 10     Pain Loc --      Pain Edu? --      Excl. in [place]? --    No data found.  Updated Vital Signs BP 135/83 (BP Location: Right Arm)   Pulse 99   Temp 99.2 F (37.3 C) (Oral)   Resp 18   LMP 02/06/2014 (LMP Unknown)   SpO2 98%   Physical Exam Vitals and nursing note reviewed.  Constitutional:      General: She is not in acute distress.    Appearance: Normal appearance. She is not ill-appearing, toxic-appearing or diaphoretic.  HENT:     Head: Normocephalic and atraumatic.     Right Ear: Tympanic membrane normal.     Left Ear: Tympanic membrane normal.     Nose: Congestion and rhinorrhea present.     Mouth/Throat:     Mouth: Mucous membranes are moist.     Pharynx: Oropharynx is clear. No oropharyngeal exudate or posterior oropharyngeal erythema.  Eyes:     Extraocular Movements:  Extraocular movements  intact.     Conjunctiva/sclera: Conjunctivae normal.     Pupils: Pupils are equal, round, and reactive to light.  Cardiovascular:     Rate and Rhythm: Normal rate and regular rhythm.     Heart sounds: Normal heart sounds.  Pulmonary:     Effort: Pulmonary effort is normal.     Breath sounds: Normal breath sounds.  Musculoskeletal:     Cervical back: Normal range of motion and neck supple.  Lymphadenopathy:     Cervical: No cervical adenopathy.  Skin:    General: Skin is warm.  Neurological:     Mental Status: She is alert and oriented to person, place, and time.     UC Treatments / Results  Labs (all labs ordered are listed, but only abnormal results are displayed) Labs Reviewed  SARS CORONAVIRUS 2 (TAT 6-24 HRS)  POC INFLUENZA A AND B ANTIGEN (URGENT CARE ONLY)    EKG   Radiology No results found.  Procedures Procedures (including critical care time)  Medications Ordered in UC Medications - No data to display  Initial Impression / Assessment and Plan / UC Course  I have reviewed the triage vital signs and the nursing notes.  Pertinent labs & imaging results that were available during my care of the patient were reviewed by me and considered in my medical decision making (see chart for details).     New. Appears viral in nature. Influenza testing is negative and COVID-19 is pending. For now rest, hydration with water, Tessalon, flonase. Discussed red flag signs and symptoms. Follow up PRN.  Final Clinical Impressions(s) / UC Diagnoses   Final diagnoses:  None   Discharge Instructions   None    ED Prescriptions   None    PDMP not reviewed this encounter.   Hughie Closs, [place] 04/04/21 1701

## 2021-04-04 NOTE — ED Triage Notes (Signed)
Pt c/o body aches, cough, congestion, chills, bilat ear pains, nausea since yesterday.

## 2021-04-05 LAB — SARS CORONAVIRUS 2 (TAT 6-24 HRS): SARS Coronavirus 2: NEGATIVE

## 2021-04-06 ENCOUNTER — Telehealth: Payer: Self-pay

## 2021-04-06 ENCOUNTER — Other Ambulatory Visit: Payer: Self-pay

## 2021-04-06 ENCOUNTER — Ambulatory Visit (INDEPENDENT_AMBULATORY_CARE_PROVIDER_SITE_OTHER): Payer: BC Managed Care – PPO | Admitting: Physician Assistant

## 2021-04-06 ENCOUNTER — Encounter: Payer: Self-pay | Admitting: Physician Assistant

## 2021-04-06 VITALS — BP 131/93 | HR 89 | Temp 98.0°F | Ht 64.0 in | Wt 205.0 lb

## 2021-04-06 DIAGNOSIS — R6889 Other general symptoms and signs: Secondary | ICD-10-CM

## 2021-04-06 LAB — POCT INFLUENZA A/B
Influenza A, POC: NEGATIVE
Influenza B, POC: NEGATIVE

## 2021-04-06 LAB — POC COVID19 BINAXNOW: SARS Coronavirus 2 Ag: NEGATIVE

## 2021-04-06 MED ORDER — OSELTAMIVIR PHOSPHATE 75 MG PO CAPS: 75.0000 mg | ORAL_CAPSULE | Freq: Two times a day (BID) | ORAL | 0 refills | Status: DC

## 2021-04-06 NOTE — Telephone Encounter (Signed)
[facility] called in and stated that [facility] oseltamivir (TAMIFLU) 75 MG capsule would like to see if something else could be sent in.

## 2021-04-06 NOTE — Telephone Encounter (Signed)
Rx sent to [facility] patient aware 

## 2021-04-06 NOTE — Progress Notes (Signed)
Subjective:    Patient ID: Renee Knapp, female    DOB: 07-12-1975, 45 y.o.   MRN: [id number]  Chief Complaint  Patient presents with   FLU LIKE SYMPTOMS    HPI  Chief complaint: Flu-like symptoms  Symptom onset: 3 days ago Pertinent positives: Body aches, coughing, HA, some nausea, chills, fever Pertinent negatives: Abd pain, diarrhea, dizziness, weakness, CP, SOB  Treatments tried: Flonase, Theraflu, Tessalon perles Vaccine status: Fully vaccinated for COVID-19 and influenza  Sick exposure: Works in a daycare; son with a cough no fever  -4 COVID-19 tests have been negative at home. -Micah Flesher to urgent care two days ago and tested negative for influenza and COVID-19.   Past Medical History:  Diagnosis Date   Anemia    History   Diabetes mellitus without complication (HCC)    diet and exercise controlled - no meds   Hypertension    Obesity    Plantar fasciitis, bilateral    History - recvd cortisone injections bilateral- no current prob as 01/2014   SVD (spontaneous vaginal delivery)    x 2    Past Surgical History:  Procedure Laterality Date   CYSTOSCOPY N/A 02/24/2014   Procedure: CYSTOSCOPY;  Surgeon: Essie Hart, MD;  Location: [facility];  Service: Gynecology;  Laterality: N/A;   DILATION AND CURETTAGE OF UTERUS     MAB   LAPAROSCOPIC ASSISTED VAGINAL HYSTERECTOMY N/A 02/24/2014   Procedure: LAPAROSCOPIC ASSISTED VAGINAL HYSTERECTOMY WITH BILATERAL SALPINGECTOMY ;  Surgeon: Essie Hart, MD;  Location: [facility];  Service: Gynecology;  Laterality: N/A;   TUBAL LIGATION     WISDOM TOOTH EXTRACTION      Family History  Problem Relation Age of Onset   Cancer Other    Hypertension Other    Diabetes Other    Diabetes Mother    Heart disease Mother    Hypertension Mother    Hypertension Father    High Cholesterol Father    Hypertension Sister    Kidney disease Sister    Stroke Sister    Hypertension Sister    Hearing loss Son     Social History   Tobacco Use    Smoking status: Never   Smokeless tobacco: Never  Vaping Use   Vaping Use: Never used  Substance Use Topics   Alcohol use: No   Drug use: No     Allergies  Allergen Reactions   Bee Venom Anaphylaxis   Reglan [Metoclopramide] Nausea And Vomiting and Anxiety    Review of Systems NEGATIVE UNLESS OTHERWISE INDICATED IN HPI      Objective:     BP (!) 131/93   Pulse 89   Temp 98 F (36.7 C)   Ht 5\' 4"  (1.626 m)   Wt 205 lb (93 kg)   LMP 02/06/2014 (LMP Unknown)   SpO2 98%   BMI 35.19 kg/m   Wt Readings from Last 3 Encounters:  04/06/21 205 lb (93 kg)  03/10/21 205 lb 9.6 oz (93.3 kg)  03/08/21 205 lb (93 kg)    BP Readings from Last 3 Encounters:  04/06/21 (!) 131/93  04/04/21 135/83  03/10/21 135/82     Physical Exam Vitals and nursing note reviewed.  Constitutional:      Appearance: Normal appearance. She is normal weight. She is ill-appearing. She is not toxic-appearing.     Comments: Wearing a hoodie sweatshirt, warm to touch, general malaise  HENT:     Head: Normocephalic and atraumatic.     Right  Ear: Tympanic membrane, ear canal and external ear normal.     Left Ear: Tympanic membrane, ear canal and external ear normal.     Nose: Nose normal.     Mouth/Throat:     Mouth: Mucous membranes are moist.  Eyes:     Extraocular Movements: Extraocular movements intact.     Conjunctiva/sclera: Conjunctivae normal.     Pupils: Pupils are equal, round, and reactive to light.  Cardiovascular:     Rate and Rhythm: Normal rate and regular rhythm.     Pulses: Normal pulses.     Heart sounds: Normal heart sounds.  Pulmonary:     Effort: Pulmonary effort is normal.     Breath sounds: Normal breath sounds.  Abdominal:     General: Abdomen is flat. Bowel sounds are normal.     Palpations: Abdomen is soft.  Musculoskeletal:        General: Normal range of motion.     Cervical back: Normal range of motion and neck supple.  Skin:    General: Skin is warm and  dry.  Neurological:     General: No focal deficit present.     Mental Status: She is alert and oriented to person, place, and time.  Psychiatric:        Mood and Affect: Mood normal.        Behavior: Behavior normal.        Thought Content: Thought content normal.        Judgment: Judgment normal.       Assessment & Plan:   Problem List Items Addressed This Visit   None Visit Diagnoses     Flu-like symptoms    -  Primary        Meds ordered this encounter  Medications   oseltamivir (TAMIFLU) 75 MG capsule    Sig: Take 1 capsule (75 mg total) by mouth 2 (two) times daily.    Dispense:  10 capsule    Refill:  0   1. Flu-like symptoms POC influenza and COVID-19 tests negative in office today. Despite our negative testing, general appearance and high prevalence in the community leads me to suspect she does have influenza. I am going to treat with Tamiflu 75 mg 1 capsule po BID x 5 days. She knows to alternate Tylenol / Motrin. Fluids, rest. Recheck prn.    Ladislaus Repsher M Rosalie Buenaventura, PA-C

## 2021-04-12 ENCOUNTER — Other Ambulatory Visit: Payer: Self-pay

## 2021-04-12 ENCOUNTER — Encounter: Payer: Self-pay | Admitting: Family Medicine

## 2021-04-12 ENCOUNTER — Ambulatory Visit: Payer: BC Managed Care – PPO | Admitting: Family Medicine

## 2021-04-12 VITALS — BP 116/81 | HR 84 | Temp 99.6°F | Ht 64.0 in | Wt 204.6 lb

## 2021-04-12 DIAGNOSIS — E785 Hyperlipidemia, unspecified: Secondary | ICD-10-CM

## 2021-04-12 DIAGNOSIS — F411 Generalized anxiety disorder: Secondary | ICD-10-CM

## 2021-04-12 DIAGNOSIS — Z1211 Encounter for screening for malignant neoplasm of colon: Secondary | ICD-10-CM | POA: Diagnosis not present

## 2021-04-12 DIAGNOSIS — E1159 Type 2 diabetes mellitus with other circulatory complications: Secondary | ICD-10-CM | POA: Diagnosis not present

## 2021-04-12 DIAGNOSIS — E559 Vitamin D deficiency, unspecified: Secondary | ICD-10-CM

## 2021-04-12 DIAGNOSIS — Z23 Encounter for immunization: Secondary | ICD-10-CM

## 2021-04-12 DIAGNOSIS — E119 Type 2 diabetes mellitus without complications: Secondary | ICD-10-CM

## 2021-04-12 DIAGNOSIS — E1169 Type 2 diabetes mellitus with other specified complication: Secondary | ICD-10-CM

## 2021-04-12 DIAGNOSIS — I152 Hypertension secondary to endocrine disorders: Secondary | ICD-10-CM

## 2021-04-12 MED ORDER — VITAMIN D (ERGOCALCIFEROL) 1.25 MG (50000 UNIT) PO CAPS: ORAL_CAPSULE | ORAL | 0 refills | Status: DC

## 2021-04-12 NOTE — Assessment & Plan Note (Signed)
Continue Crestor 10 mg daily.  Check lipids next blood draw.

## 2021-04-12 NOTE — Assessment & Plan Note (Signed)
Refilled vitamin D 50000IU once weekly.

## 2021-04-12 NOTE — Patient Instructions (Signed)
It was very nice to see you today!  Your blood pressure and blood sugar look great today. Your A1c is 6.0.  I will refill your vitamin D.  No other medication changes today.  We will refill your colonoscopy.  Will see you back in 6 months for your annual checkup with labs.  Come back sooner if needed.  Take care, Dr [name]  PLEASE NOTE:  If you had any lab tests please let us know if you have not heard back within a few days. You may see your results on mychart before we have a chance to review them but we will give you a call once they are reviewed by [place]. If we ordered any referrals today, please let us know if you have not heard from their office within the next week.   Please try these tips to maintain a healthy lifestyle:  Eat at least 3 REAL meals and 1-2 snacks per day.  Aim for no more than 5 hours between eating.  If you eat breakfast, please do so within one hour of getting up.   Each meal should contain half fruits/vegetables, one quarter protein, and one quarter carbs (no bigger than a computer mouse)  Cut down on sweet beverages. This includes juice, soda, and sweet tea.   Drink at least 1 glass of water with each meal and aim for at least 8 glasses per day  Exercise at least 150 minutes every week.

## 2021-04-12 NOTE — Addendum Note (Signed)
Addended by: Dyann Kief on: 04/12/2021 03:13 PM   Modules accepted: Orders

## 2021-04-12 NOTE — Assessment & Plan Note (Signed)
Blood pressure at goal today on Irbesartan 75 mg daily.  Follow-up in 6 months.

## 2021-04-12 NOTE — Assessment & Plan Note (Signed)
Stable off medication.  She has previously been prescribed prozac however this caused excessive drowsiness.  She will let me know if symptoms worsen.  Would consider trial of alternative SSRI such as Zoloft or Lexapro.
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
## 2021-04-12 NOTE — Assessment & Plan Note (Signed)
A1c well controlled 6.0.  Continue current regimen metformin 1000 mg twice daily and Ozempic 0.5 mg weekly.  Follow-up in 6 months.  If A1c is still well controlled we will decrease dose of metformin.  She is having some diarrhea however this is tolerable.

## 2021-06-14 ENCOUNTER — Other Ambulatory Visit: Payer: Self-pay | Admitting: Family Medicine

## 2021-06-20 ENCOUNTER — Other Ambulatory Visit: Payer: Self-pay | Admitting: Family Medicine

## 2021-06-21 ENCOUNTER — Other Ambulatory Visit: Payer: Self-pay | Admitting: Family Medicine

## 2021-06-21 NOTE — Telephone Encounter (Signed)
Please send prior auth. She has diabetes and has been on this med for awhile.

## 2021-07-07 ENCOUNTER — Telehealth: Payer: Self-pay

## 2021-07-07 ENCOUNTER — Other Ambulatory Visit: Payer: Self-pay | Admitting: Family Medicine

## 2021-07-07 NOTE — Telephone Encounter (Signed)
Dr. [name], pt's insurance will not Cover VIT D 50,000 units. Please advise.

## 2021-07-07 NOTE — Telephone Encounter (Signed)
Spoke with Trinna Post  PA done via phone  Rx Ozempic  Faxed last note to [phone] Case #[id number] Waiting for determination

## 2021-07-07 NOTE — Telephone Encounter (Signed)
Patient states she has just got off the phone with her insurance company and they have started a PA for Ozempic that they will be sending over to our office.  [facility] [address]   Patient would like to know if we have any samples of ozempic.  States she has not had this med in 3 weeks.

## 2021-07-07 NOTE — Telephone Encounter (Signed)
Babby from [facility] called re: prior authorization for medication. Stated she sent over a fax re this medication.- Please call back at [phone] to be connected with a clinical pharmacist.

## 2021-07-08 NOTE — Telephone Encounter (Signed)
She can take 5000IU daily. This is available over the counter.  Renee Knapp. [name], MD 07/08/2021 7:59 AM

## 2021-07-08 NOTE — Telephone Encounter (Signed)
Called pt and left detailed vm advising per Dr Jerline Pain she can take 5000IUs of Vitamin D OTC

## 2021-07-08 NOTE — Telephone Encounter (Signed)
Patient is requesting a sample if we have in stock.  States she has been of the med and not able to go to work for the last 2 days b/c she has been wobbly.    Please call patient back at [phone].

## 2021-07-08 NOTE — Telephone Encounter (Signed)
Record relaxed today  Waiting for records to give [place] determination

## 2021-07-09 ENCOUNTER — Telehealth: Payer: Self-pay | Admitting: Family Medicine

## 2021-07-09 NOTE — Telephone Encounter (Signed)
Spoke with Janette at patient insurance company  Stated [facility] Ozempic for 28days Till insurance results faxed  Records unable to faxed due to Comm error [id number]

## 2021-07-09 NOTE — Telephone Encounter (Signed)
Called back at [phone] Rx authorization done for 28 days  Insurance waiting for records

## 2021-07-09 NOTE — Telephone Encounter (Signed)
Jenea with [employer] called wanting to speak with Francena Hanly. Graylin Shiver asks that Francena Hanly send one page of Pt's diagnosis with ICD 10 Code. Please call Dartha Lodge today at [phone].

## 2021-07-09 NOTE — Telephone Encounter (Signed)
[facility]  See previews note

## 2021-08-12 ENCOUNTER — Other Ambulatory Visit: Payer: Self-pay

## 2021-08-12 ENCOUNTER — Ambulatory Visit (INDEPENDENT_AMBULATORY_CARE_PROVIDER_SITE_OTHER): Payer: 59 | Admitting: Family Medicine

## 2021-08-12 VITALS — BP 138/86 | HR 94 | Temp 98.5°F | Ht 64.0 in | Wt 206.4 lb

## 2021-08-12 DIAGNOSIS — I10 Essential (primary) hypertension: Secondary | ICD-10-CM

## 2021-08-12 DIAGNOSIS — E1159 Type 2 diabetes mellitus with other circulatory complications: Secondary | ICD-10-CM

## 2021-08-12 DIAGNOSIS — R52 Pain, unspecified: Secondary | ICD-10-CM

## 2021-08-12 DIAGNOSIS — R0989 Other specified symptoms and signs involving the circulatory and respiratory systems: Secondary | ICD-10-CM

## 2021-08-12 DIAGNOSIS — E119 Type 2 diabetes mellitus without complications: Secondary | ICD-10-CM

## 2021-08-12 DIAGNOSIS — J329 Chronic sinusitis, unspecified: Secondary | ICD-10-CM | POA: Diagnosis not present

## 2021-08-12 LAB — POC COVID19 BINAXNOW: SARS Coronavirus 2 Ag: NEGATIVE

## 2021-08-12 LAB — POCT INFLUENZA A/B
Influenza A, POC: NEGATIVE
Influenza B, POC: NEGATIVE

## 2021-08-12 MED ORDER — AZITHROMYCIN 250 MG PO TABS: ORAL_TABLET | ORAL | 0 refills | Status: DC

## 2021-08-12 MED ORDER — AZELASTINE HCL 0.1 % NA SOLN: 2.0000 | Freq: Two times a day (BID) | NASAL | 12 refills | Status: DC

## 2021-08-12 MED ORDER — ONDANSETRON 4 MG PO TBDP: 4.0000 mg | ORAL_TABLET | Freq: Three times a day (TID) | ORAL | 0 refills | Status: DC | PRN

## 2021-08-12 MED ORDER — PREDNISONE 20 MG PO TABS: 20.0000 mg | ORAL_TABLET | Freq: Every day | ORAL | 0 refills | Status: DC

## 2021-08-12 NOTE — Patient Instructions (Addendum)
It was very nice to see you today! ? ?I think you are developing a sinus infection.  Please start Astelin nasal spray and prednisone.  Start antibiotic if not improving in a few days. ? ?Let me know if not improving by next week. ? ?Take care, ?Dr Jerline Pain ? ?PLEASE NOTE: ? ?If you had any lab tests please let us know if you have not heard back within a few days. You may see your results on mychart before we have a chance to review them but we will give you a call once they are reviewed by [place]. If we ordered any referrals today, please let us know if you have not heard from their office within the next week.  ? ?Please try these tips to maintain a healthy lifestyle: ? ?Eat at least 3 REAL meals and 1-2 snacks per day.  Aim for no more than 5 hours between eating.  If you eat breakfast, please do so within one hour of getting up.  ? ?Each meal should contain half fruits/vegetables, one quarter protein, and one quarter carbs (no bigger than a computer mouse) ? ?Cut down on sweet beverages. This includes juice, soda, and sweet tea.  ? ?Drink at least 1 glass of water with each meal and aim for at least 8 glasses per day ? ?Exercise at least 150 minutes every week.   ?

## 2021-08-12 NOTE — Assessment & Plan Note (Signed)
At goal on irbesartan 75mg  daily.  ?

## 2021-08-12 NOTE — Assessment & Plan Note (Signed)
Last A1c 6.0. Continue metformin 1000mg  twice daily and ozempic 0.5mg  once weekly.  ?

## 2021-08-12 NOTE — Progress Notes (Signed)
? ?  Renee Knapp is a 46 y.o. female who presents today for an office visit. ? ?Assessment/Plan:  ?New/Acute Problems: ?Sinusitis ?No red flags.  Rapid flu and COVID both negative here.  Start Astelin.  Also start prednisone burst.  She is on oral prednisone in the past we will send pocket prescription for azithromycin with instruction not start with symptoms did not improve the next few days.  Encouraged hydration.  She can continue over-the-counter meds. ? ?Chronic Problems Addressed Today: ?Hypertension associated with diabetes ([name]) ?At goal on irbesartan 75mg  daily.  ? ?Controlled type 2 diabetes mellitus without complication, without long-term current use of insulin ([name]) ?Last A1c 6.0. Continue metformin 1000mg  twice daily and ozempic 0.5mg  once weekly.  ? ? ?  ?Subjective:  ?HPI: ? ?Patient here with flu like symptoms. This started a few days ago Her symptoms include nasal congestion, hoarse and body aches. She has been coughing as well. Some mucus with green production. She woke up with no voice yesterday. She work in a [employer] manager. She has tried resting but no other treatment. Has some back pain. Had Covid test which was negative. Some chills. No sore throat. ? ?   ?  ?Objective:  ?Physical Exam: ?BP 138/86 (BP Location: Right Arm)   Pulse 94   Temp 98.5 ?F (36.9 ?C) (Temporal)   Ht 5\' 4"  (1.626 m)   Wt 206 lb 6.4 oz (93.6 kg)   LMP 02/06/2014 (LMP Unknown)   SpO2 99%   BMI 35.43 kg/m?   ?Gen: No acute distress, resting comfortably ?HEENT: TMs with clear effusion.  OP erythematous.  Nasal mucosa erythematous and boggy bilaterally. ?CV: Regular rate and rhythm with no murmurs appreciated ?Pulm: Normal work of breathing, clear to auscultation bilaterally with no crackles, wheezes, or rhonchi ?Neuro: Grossly normal, moves all extremities ?Psych: Normal affect and thought content ? ?   ? ? ?I,Savera Zaman,acting as a Education administrator for Dimas Chyle, MD.,have documented all relevant documentation  on the behalf of Dimas Chyle, MD,as directed by  Dimas Chyle, MD while in the presence of Dimas Chyle, MD.  ? ?I, Dimas Chyle, MD, have reviewed all documentation for this visit. The documentation on 08/12/21 for the exam, diagnosis, procedures, and orders are all accurate and complete. ? ?Algis Greenhouse. Jerline Pain, MD ?08/12/2021 3:19 PM  ? ?

## 2021-09-03 ENCOUNTER — Other Ambulatory Visit: Payer: Self-pay | Admitting: Family Medicine

## 2021-09-15 ENCOUNTER — Ambulatory Visit (HOSPITAL_COMMUNITY)
Admission: EM | Admit: 2021-09-15 | Discharge: 2021-09-15 | Disposition: A | Discharge status: Home / Self Care | Payer: 59 | Attending: Emergency Medicine | Admitting: Emergency Medicine

## 2021-09-15 ENCOUNTER — Encounter (HOSPITAL_COMMUNITY): Payer: Self-pay

## 2021-09-15 DIAGNOSIS — N3000 Acute cystitis without hematuria: Secondary | ICD-10-CM | POA: Diagnosis not present

## 2021-09-15 DIAGNOSIS — R1030 Lower abdominal pain, unspecified: Secondary | ICD-10-CM

## 2021-09-15 DIAGNOSIS — B9689 Other specified bacterial agents as the cause of diseases classified elsewhere: Secondary | ICD-10-CM | POA: Diagnosis not present

## 2021-09-15 DIAGNOSIS — N760 Acute vaginitis: Secondary | ICD-10-CM | POA: Diagnosis not present

## 2021-09-15 LAB — POCT URINALYSIS DIPSTICK, ED / UC
Bilirubin Urine: NEGATIVE
Glucose, UA: NEGATIVE mg/dL
Ketones, ur: NEGATIVE mg/dL
Nitrite: NEGATIVE
Protein, ur: NEGATIVE mg/dL
Specific Gravity, Urine: 1.02 (ref 1.005–1.030)
Urobilinogen, UA: 0.2 mg/dL (ref 0.0–1.0)
pH: 6 (ref 5.0–8.0)

## 2021-09-15 MED ORDER — METRONIDAZOLE 0.75 % VA GEL: 1.0000 | Freq: Every day | VAGINAL | 0 refills | Status: DC

## 2021-09-15 MED ORDER — NITROFURANTOIN MONOHYD MACRO 100 MG PO CAPS: 100.0000 mg | ORAL_CAPSULE | Freq: Two times a day (BID) | ORAL | 0 refills | Status: DC

## 2021-09-15 NOTE — ED Provider Notes (Signed)
?[place] ? ? ?MRN: [id number] DOB: [birth year] ? ?Subjective:  ? ?Chief Complaint; Onset this morning of "constant sharp" low abdominal pain and urinary frequency. Pt feels the pain is coming from her ovaries. Notes spontaneous spotting on two separate days last week. Hx of hysterectomy. Has been taking tylenol w/o relief. No v/d, fevers or dysuria  pt denies being sexually active ?Chief Complaint  ?Patient presents with  ? Abdominal Pain  ? ? ?Renee Knapp is a 46 y.o. female presenting for lower abdominal pain that started yesterday.  She also noted 2 episodes of spotting a couple of weeks ago none since.  She denies dysuria, nausea, vomiting, fevers.  She has history of hysterectomy in 2015 ? ?No current facility-administered medications for this encounter. ? ?Current Outpatient Medications:  ?  azelastine (ASTELIN) 0.1 % nasal spray, Place 2 sprays into both nostrils 2 (two) times daily., Disp: 30 mL, Rfl: 12 ?  azithromycin (ZITHROMAX) 250 MG tablet, Take 2 tabs day 1, then 1 tab daily, Disp: 6 each, Rfl: 0 ?  Bayer Microlet Lancets lancets, USE 1 SINGLE USE LANCETS AS NEEDED TO CHECK BLOOD SUGAR, Disp: 100 each, Rfl: 1 ?  CONTOUR NEXT TEST test strip, USE 1 LANCETS AS NEEDED TO CHECK BLOOD SUGAR, Disp: 100 strip, Rfl: 1 ?  EPINEPHrine (EPIPEN 2-PAK) 0.3 mg/0.3 mL IJ SOAJ injection, Inject 0.3 mg into the muscle as needed for anaphylaxis., Disp: 2 each, Rfl: 1 ?  fluticasone (FLONASE) 50 MCG/ACT nasal spray, Place 2 sprays into both nostrils daily. (Patient not taking: Reported on 08/12/2021), Disp: 16 mL, Rfl: 0 ?  irbesartan (AVAPRO) 75 MG tablet, TAKE 1 TABLET BY MOUTH EVERY DAY (Patient not taking: Reported on 08/12/2021), Disp: 90 tablet, Rfl: 0 ?  metFORMIN (GLUCOPHAGE-XR) 500 MG 24 hr tablet, Take 2 tablets (1,000 mg total) by mouth 2 (two) times daily., Disp: 360 tablet, Rfl: 1 ?  ondansetron (ZOFRAN-ODT) 4 MG disintegrating tablet, Take 1 tablet (4 mg total) by mouth every 8  (eight) hours as needed., Disp: 20 tablet, Rfl: 0 ?  OZEMPIC, 0.25 OR 0.5 MG/DOSE, 2 MG/1.5ML SOPN, INJECT 0.5 MG INTO THE SKIN ONCE A WEEK., Disp: 1.5 mL, Rfl: 1 ?  predniSONE (DELTASONE) 20 MG tablet, Take 1 tablet (20 mg total) by mouth daily with breakfast., Disp: 5 tablet, Rfl: 0 ?  rosuvastatin (CRESTOR) 10 MG tablet, Take 1 tablet (10 mg total) by mouth daily., Disp: 90 tablet, Rfl: 3 ?  Vitamin D, Cholecalciferol, 25 MCG (1000 UT) TABS, Take 1,000 Units by mouth daily. gummy, Disp: , Rfl:  ?  Vitamin D, Ergocalciferol, (DRISDOL) 1.25 MG (50000 UNIT) CAPS capsule, TAKE 1 CAPSULE BY MOUTH ONCE A WEEK FOR 12 WEEKS THEN OTC 4000 UNITS/DAY, Disp: 12 capsule, Rfl: 0  ? ?Allergies  ?Allergen Reactions  ? Bee Venom Anaphylaxis  ? Reglan [Metoclopramide] Nausea And Vomiting and Anxiety  ? ? ?Past Medical History:  ?Diagnosis Date  ? Anemia   ? History  ? Diabetes mellitus without complication ([place])   ? diet and exercise controlled - no meds  ? Hypertension   ? Obesity   ? Plantar fasciitis, bilateral   ? History - recvd cortisone injections bilateral- no current prob as 01/2014  ? SVD (spontaneous vaginal delivery)   ? x 2  ?  ? ?Review of Systems  ?All other systems reviewed and are negative. ? ? ?Objective:  ? ?Vitals: ?BP 132/75 (BP Location: Right Arm)   Pulse  85   Temp 97.9 ?F (36.6 ?C) (Oral)   Resp 18   LMP 02/06/2014 (LMP Unknown)   SpO2 98%  ? ?Physical Exam ?Vitals and nursing note reviewed.  ?Constitutional:   ?   General: She is not in acute distress. ?   Appearance: She is well-developed.  ?HENT:  ?   Head: Normocephalic and atraumatic.  ?Eyes:  ?   Conjunctiva/sclera: Conjunctivae normal.  ?Cardiovascular:  ?   Rate and Rhythm: Normal rate and regular rhythm.  ?   Heart sounds: No murmur heard. ?Pulmonary:  ?   Effort: Pulmonary effort is normal. No respiratory distress.  ?   Breath sounds: Normal breath sounds.  ?Abdominal:  ?   General: Abdomen is protuberant.  ?   Palpations: Abdomen is soft.  ?    Tenderness: There is abdominal tenderness. There is no right CVA tenderness, left CVA tenderness or guarding. Negative signs include McBurney's sign.  ?   Comments: Lower pelvis has direct and rebound tenderness right greater than left, mild.  No distention. Pain over ovaries  ?Genitourinary: ?   Vagina: No foreign body. Vaginal discharge and bleeding (scant at posterior vault) present. No erythema.  ?   Uterus: Absent.   ?   Adnexa:     ?   Right: Tenderness present.     ?   Left: Tenderness present.   ?   Comments: White creamy discharge in vaginal pouch with mildly friable tissue at the posterior aspect which appears to bleed readily.  Mild fishy odor no evidence of yeast positive tender adnexa bilateral.  Bus negative ?Musculoskeletal:     ?   General: No swelling.  ?   Cervical back: Neck supple.  ?Skin: ?   General: Skin is warm and dry.  ?   Capillary Refill: Capillary refill takes less than 2 seconds.  ?Neurological:  ?   Mental Status: She is alert.  ?Psychiatric:     ?   Mood and Affect: Mood normal.  ? ? ?Results for orders placed or performed during the hospital encounter of 09/15/21 (from the past 24 hour(s))  ?POC Urinalysis dipstick     Status: Abnormal  ? Collection Time: 09/15/21  5:36 PM  ?Result Value Ref Range  ? Glucose, UA NEGATIVE NEGATIVE mg/dL  ? Bilirubin Urine NEGATIVE NEGATIVE  ? Ketones, ur NEGATIVE NEGATIVE mg/dL  ? Specific Gravity, Urine 1.020 1.005 - 1.030  ? Hgb urine dipstick SMALL (A) NEGATIVE  ? pH 6.0 5.0 - 8.0  ? Protein, ur NEGATIVE NEGATIVE mg/dL  ? Urobilinogen, UA 0.2 0.0 - 1.0 mg/dL  ? Nitrite NEGATIVE NEGATIVE  ? Leukocytes,Ua SMALL (A) NEGATIVE  ? ? ?No results found.  ?  ? ?Assessment and Plan :  ? ?1. Lower abdominal pain   ? ? ?No orders of the defined types were placed in this encounter. ? ? ?MDM:  ?Renee Knapp is a 46 y.o. female presenting for lower abdominal pain without fever and vaginal bleeding status post hysterectomy.  Patient had concerns for her  ovaries which were intact.  Her pain has been for the last couple of days.  Patient was tender on abdominal exam lower abdomen direct more right than left however she has been afebrile and is hemodynamically stable.  Pelvic exam revealed some friable tissue at the posterior vault with vaginal discharge and a fishy odor so patient is treated with MetroGel and urine showed evidence of small amount of infection so Macrobid will be  started as well.  Patient is encouraged to follow-up with her GYN for ultrasound if her pain persists and to go to the ER if her pain worsens or she develops new fever or nausea vomiting.  I discussed treatment, follow up and return instructions. Questions were answered. Patient/representative stated understanding of instructions and patient is stable for discharge. ?Leida Lauth FNP-C MCN  ?  ?Hezzie Bump, NP ?09/15/21 1753 ? ?

## 2021-09-15 NOTE — Discharge Instructions (Addendum)
Your lower abdominal pain may be related to the urinary tract infection.  Take antibiotic as directed and follow-up with your GYN.  Use MetroGel as directed to decrease vaginal odor.  Go to the emergency room if your pain is worse, you develop fever or new symptoms for reevaluation.  An ultrasound may be helpful if your lower abdominal pain persists. ?

## 2021-09-15 NOTE — ED Triage Notes (Signed)
Onset this morning of "constant sharp" low abdominal pain and urinary frequency. Pt feels the pain is coming from her ovaries. Notes spontaneous spotting on two separate days last week. Hx of hysterectomy. Has been taking tylenol w/o relief. No v/d or dysuria. ?

## 2021-09-17 ENCOUNTER — Emergency Department (HOSPITAL_COMMUNITY): Payer: 59

## 2021-09-17 ENCOUNTER — Telehealth: Payer: Self-pay | Admitting: Family Medicine

## 2021-09-17 ENCOUNTER — Emergency Department (HOSPITAL_COMMUNITY)
Admission: EM | Admit: 2021-09-17 | Discharge: 2021-09-17 | Disposition: A | Discharge status: Home / Self Care | Payer: 59 | Attending: Emergency Medicine | Admitting: Emergency Medicine

## 2021-09-17 ENCOUNTER — Encounter (HOSPITAL_COMMUNITY): Payer: Self-pay

## 2021-09-17 ENCOUNTER — Other Ambulatory Visit: Payer: Self-pay

## 2021-09-17 DIAGNOSIS — R102 Pelvic and perineal pain: Secondary | ICD-10-CM | POA: Diagnosis not present

## 2021-09-17 DIAGNOSIS — Z7984 Long term (current) use of oral hypoglycemic drugs: Secondary | ICD-10-CM | POA: Insufficient documentation

## 2021-09-17 DIAGNOSIS — R1030 Lower abdominal pain, unspecified: Secondary | ICD-10-CM | POA: Diagnosis present

## 2021-09-17 DIAGNOSIS — I10 Essential (primary) hypertension: Secondary | ICD-10-CM | POA: Insufficient documentation

## 2021-09-17 DIAGNOSIS — E119 Type 2 diabetes mellitus without complications: Secondary | ICD-10-CM | POA: Insufficient documentation

## 2021-09-17 DIAGNOSIS — Z79899 Other long term (current) drug therapy: Secondary | ICD-10-CM | POA: Diagnosis not present

## 2021-09-17 LAB — COMPREHENSIVE METABOLIC PANEL
ALT: 18 U/L (ref 0–44)
AST: 30 U/L (ref 15–41)
Albumin: 3.7 g/dL (ref 3.5–5.0)
Alkaline Phosphatase: 71 U/L (ref 38–126)
Anion gap: 9 (ref 5–15)
BUN: 9 mg/dL (ref 6–20)
CO2: 26 mmol/L (ref 22–32)
Calcium: 9.2 mg/dL (ref 8.9–10.3)
Chloride: 103 mmol/L (ref 98–111)
Creatinine, Ser: 0.87 mg/dL (ref 0.44–1.00)
GFR, Estimated: >60 mL/min (ref >60)
Glucose, Bld: 174 mg/dL — ABNORMAL HIGH (ref 70–99)
Potassium: 4.7 mmol/L (ref 3.5–5.1)
Sodium: 138 mmol/L (ref 135–145)
Total Bilirubin: 1 mg/dL (ref 0.3–1.2)
Total Protein: 7.2 g/dL (ref 6.5–8.1)

## 2021-09-17 LAB — URINALYSIS, ROUTINE W REFLEX MICROSCOPIC
Bilirubin Urine: NEGATIVE
Glucose, UA: NEGATIVE mg/dL
Hgb urine dipstick: NEGATIVE
Ketones, ur: NEGATIVE mg/dL
Leukocytes,Ua: NEGATIVE
Nitrite: NEGATIVE
Protein, ur: NEGATIVE mg/dL
Specific Gravity, Urine: 1.005 (ref 1.005–1.030)
pH: 6 (ref 5.0–8.0)

## 2021-09-17 LAB — CBC
HCT: 42.4 % (ref 36.0–46.0)
Hemoglobin: 13.4 g/dL (ref 12.0–15.0)
MCH: 26.6 pg (ref 26.0–34.0)
MCHC: 31.6 g/dL (ref 30.0–36.0)
MCV: 84.3 fL (ref 80.0–100.0)
Platelets: 332 10*3/uL (ref 150–400)
RBC: 5.03 MIL/uL (ref 3.87–5.11)
RDW: 13.4 % (ref 11.5–15.5)
WBC: 7.4 10*3/uL (ref 4.0–10.5)
nRBC: 0 % (ref 0.0–0.2)

## 2021-09-17 LAB — LIPASE, BLOOD: Lipase: 44 U/L (ref 11–51)

## 2021-09-17 MED ORDER — DOXYCYCLINE HYCLATE 100 MG PO CAPS: 100.0000 mg | ORAL_CAPSULE | Freq: Two times a day (BID) | ORAL | 0 refills | Status: DC

## 2021-09-17 MED ORDER — ONDANSETRON HCL 4 MG/2ML IJ SOLN
4.0000 mg | Freq: Once | INTRAMUSCULAR | Status: AC
  Administered 2021-09-17: 4 mg via INTRAVENOUS
  Filled 2021-09-17: qty 2

## 2021-09-17 MED ORDER — MORPHINE SULFATE (PF) 4 MG/ML IV SOLN
4.0000 mg | Freq: Once | INTRAVENOUS | Status: AC
  Administered 2021-09-17: 4 mg via INTRAVENOUS
  Filled 2021-09-17: qty 1

## 2021-09-17 MED ORDER — IOHEXOL 300 MG/ML  SOLN
100.0000 mL | Freq: Once | INTRAMUSCULAR | Status: AC | PRN
  Administered 2021-09-17: 100 mL via INTRAVENOUS

## 2021-09-17 MED ORDER — NAPROXEN 500 MG PO TABS: 500.0000 mg | ORAL_TABLET | Freq: Two times a day (BID) | ORAL | 0 refills | Status: DC

## 2021-09-17 NOTE — Telephone Encounter (Signed)
Patient Name: Renee Knapp [facility] Gender: Female DOB: Feb 03, 1976 Age: 46 Y 9 M 6 D Return Phone Number: [phone] (Primary) Address: City/ State/ Zip: [place]  [address] Client [facility] - [facility], [facility] Provider Jacquiline Doe- MD Contact Type Call Who Is Calling Patient / Member / Family / Caregiver Call Type Triage / Clinical Relationship To Patient Self Return Phone Number [phone] (Primary) Chief Complaint SEVERE ABDOMINAL PAIN - Severe pain in abdomen Reason for Call Symptomatic / Request for Health Information Initial Comment Caller states she is having bleeding right now, history of having hysterectomy. The bleeding is coming out in clogs and has pain on sides of abdomen and once in a while gets sharp pain on her back. Translation No Nurse Assessment Nurse: Freida Busman, RN, Diane Date/Time (Eastern Time): 09/17/2021 9:05:30 AM Confirm and document reason for call. If symptomatic, describe symptoms. ---Caller states she is having vaginal bleeding with small clots. Started last Wednesday. UC gave pt. a cream for yeast infection. s/p hysterectomy. Also having some moderate and severe abd. pain. Does the patient have any new or worsening symptoms? ---Yes Will a triage be completed? ---Yes Related visit to physician within the last 2 weeks? ---Yes Does the PT have any chronic conditions? (i.e. diabetes, asthma, this includes High risk factors for pregnancy, etc.) ---Yes List chronic conditions. ---DM Is the patient pregnant or possibly pregnant? (Ask all females between the ages of 15-55) ---No Is this a behavioral health or substance abuse call? ---No  Guidelines Guideline Title Affirmed Question Affirmed Notes Nurse Date/Time (Eastern Time) Abdominal Pain - Female [1] SEVERE pain (e.g., excruciating) AND [2] present > 1 hour Freida Busman, RN, Diane 09/17/2021 9:08:06 AM Disp.  Time Lamount Cohen Time) Disposition Final User 09/17/2021 9:04:16 AM Send to Urgent Samuel Jester, Zochil 09/17/2021 9:09:22 AM Go to ED Now Yes Freida Busman, RN, Diane Caller Disagree/Comply Comply Caller Understands Yes PreDisposition Call Doctor Care Advice Given Per Guideline GO TO ED NOW: * You need to be seen in the Emergency Department. NOTHING BY MOUTH: * Do not eat or drink anything for now. CARE ADVICE given per Abdominal Pain, Female (Adult) guideline. Comments User: Matt Holmes, RN Date/Time Lamount Cohen Time): 09/17/2021 9:10:00 AM Pain is currently 10/10 Referrals [facility] - ED

## 2021-09-17 NOTE — Telephone Encounter (Signed)
FYI

## 2021-09-17 NOTE — ED Provider Notes (Signed)
?[place] ?Provider Note ? ? ?CSN: [id number] ?Arrival date & time: 09/17/21  1015 ? ?  ? ?History ? ?Chief Complaint  ?Patient presents with  ? Abdominal Pain  ? ? ?Renee Knapp is a 46 y.o. female. ? ?Patient is a 46 year old female with a history of diabetes, anemia, hypertension, prior ovarian cyst and status post hysterectomy who is presenting today with worsening lower abdominal pain and vaginal bleeding.  Patient reports the pain started on Wednesday and gradually worsened.  It is more severe on the right side but she does feel some pain on the left side as well.  On Wednesday evening she went to urgent care and at that time had a urine and they told her she had a UTI and she also had a pelvic exam at that time they noted some minimal blood in the vaginal vault and diagnosed her with bacterial vaginosis.  She reports since that time she has been taking metronidazole and Macrobid but the pain is not getting any better.  She denies ever having any dysuria, frequency or urgency.  She has had nausea without vomiting.  Her bowel movements have been normal.  She denies any fever.  No movement or eating seems to make the pain worse.  She has been able to eat.  She came today because the pain was just not getting any better.  She did talk to her regular doctor but they recommended she come here for further evaluation ? ?The history is provided by the patient.  ?Abdominal Pain ? ?  ? ?Home Medications ?Prior to Admission medications   ?Medication Sig Start Date End Date Taking? Authorizing Provider  ?doxycycline (VIBRAMYCIN) 100 MG capsule Take 1 capsule (100 mg total) by mouth 2 (two) times daily. 09/17/21  Yes Blanchie Dessert, MD  ?naproxen (NAPROSYN) 500 MG tablet Take 1 tablet (500 mg total) by mouth 2 (two) times daily. 09/17/21  Yes Blanchie Dessert, MD  ?azelastine (ASTELIN) 0.1 % nasal spray Place 2 sprays into both nostrils 2 (two) times daily. 08/12/21   Vivi Barrack, MD  ?azithromycin [facility]) 250 MG tablet Take 2 tabs day 1, then 1 tab daily 08/12/21   Vivi Barrack, MD  ?Bayer Microlet Lancets lancets USE 1 SINGLE USE LANCETS AS NEEDED TO CHECK BLOOD SUGAR 04/24/19   Orma Flaming, MD  ?CONTOUR NEXT TEST test strip USE 1 LANCETS AS NEEDED TO CHECK BLOOD SUGAR 03/30/20   Orma Flaming, MD  ?EPINEPHrine (EPIPEN 2-PAK) 0.3 mg/0.3 mL IJ SOAJ injection Inject 0.3 mg into the muscle as needed for anaphylaxis. 10/08/20   Orma Flaming, MD  ?fluticasone Asencion Islam) 50 MCG/ACT nasal spray Place 2 sprays into both nostrils daily. ?Patient not taking: Reported on 08/12/2021 04/04/21   Hughie Closs, PA-C  ?irbesartan (AVAPRO) 75 MG tablet TAKE 1 TABLET BY MOUTH EVERY DAY ?Patient not taking: Reported on 08/12/2021 04/01/21   Jeanie Sewer, NP  ?metFORMIN (GLUCOPHAGE-XR) 500 MG 24 hr tablet Take 2 tablets (1,000 mg total) by mouth 2 (two) times daily. 10/08/20   Orma Flaming, MD  ?metroNIDAZOLE (METROGEL VAGINAL) 0.75 % vaginal gel Place 1 Applicatorful vaginally at bedtime. 09/15/21   Hezzie Bump, NP  ?ondansetron (ZOFRAN-ODT) 4 MG disintegrating tablet Take 1 tablet (4 mg total) by mouth every 8 (eight) hours as needed. 08/12/21   Vivi Barrack, MD  ?OZEMPIC, 0.25 OR 0.5 MG/DOSE, 2 MG/1.5ML SOPN INJECT 0.5 MG INTO THE SKIN ONCE A WEEK. 09/03/21  Vivi Barrack, MD  ?predniSONE (DELTASONE) 20 MG tablet Take 1 tablet (20 mg total) by mouth daily with breakfast. 08/12/21   Vivi Barrack, MD  ?rosuvastatin (CRESTOR) 10 MG tablet Take 1 tablet (10 mg total) by mouth daily. 10/12/20   Orma Flaming, MD  ?Vitamin D, Cholecalciferol, 25 MCG (1000 UT) TABS Take 1,000 Units by mouth daily. gummy    [provider]  ?Vitamin D, Ergocalciferol, (DRISDOL) 1.25 MG (50000 UNIT) CAPS capsule TAKE 1 CAPSULE BY MOUTH ONCE A WEEK FOR 12 WEEKS THEN OTC 4000 UNITS/DAY 04/12/21   Vivi Barrack, MD  ?   ? ?Allergies    ?Bee venom and Reglan [metoclopramide]   ? ?Review of  Systems   ?Review of Systems  ?Gastrointestinal:  Positive for abdominal pain.  ? ?Physical Exam ?Updated Vital Signs ?BP 124/71   Pulse 87   Temp 98.6 ?F (37 ?C) (Oral)   Resp 16   Ht 5\' 4"  (1.626 m)   Wt 93 kg   LMP 02/06/2014 (LMP Unknown)   SpO2 100%   BMI 35.19 kg/m?  ?Physical Exam ?Vitals and nursing note reviewed.  ?Constitutional:   ?   General: She is not in acute distress. ?   Appearance: She is well-developed.  ?HENT:  ?   Head: Normocephalic and atraumatic.  ?Eyes:  ?   Pupils: Pupils are equal, round, and reactive to light.  ?Cardiovascular:  ?   Rate and Rhythm: Normal rate and regular rhythm.  ?   Heart sounds: Normal heart sounds. No murmur heard. ?  No friction rub.  ?Pulmonary:  ?   Effort: Pulmonary effort is normal.  ?   Breath sounds: Normal breath sounds. No wheezing or rales.  ?Abdominal:  ?   General: Bowel sounds are normal. There is no distension.  ?   Palpations: Abdomen is soft.  ?   Tenderness: There is abdominal tenderness in the right lower quadrant and suprapubic area. There is guarding. There is no right CVA tenderness, left CVA tenderness or rebound.  ?Genitourinary: ?   Comments: A few streaks of blood noted in the vaginal vault.  Cuff appears intact and no active bleeding at this time.  Mild discharge present in the vaginal vault.  Bilateral adnexal tenderness ?Musculoskeletal:     ?   General: No tenderness. Normal range of motion.  ?   Comments: No edema  ?Skin: ?   General: Skin is warm and dry.  ?   Findings: No rash.  ?Neurological:  ?   Mental Status: She is alert and oriented to person, place, and time.  ?   Cranial Nerves: No cranial nerve deficit.  ?Psychiatric:     ?   Behavior: Behavior normal.  ? ? ?ED Results / Procedures / Treatments   ?Labs ?(all labs ordered are listed, but only abnormal results are displayed) ?Labs Reviewed  ?COMPREHENSIVE METABOLIC PANEL - Abnormal; Notable for the following components:  ?    Result Value  ? Glucose, Bld 174 (*)   ? All  other components within normal limits  ?URINALYSIS, ROUTINE W REFLEX MICROSCOPIC - Abnormal; Notable for the following components:  ? Color, Urine STRAW (*)   ? All other components within normal limits  ?LIPASE, BLOOD  ?CBC  ? ? ?EKG ?None ? ?Radiology ?CT ABDOMEN PELVIS W CONTRAST ? ?Result Date: 09/17/2021 ?CLINICAL DATA:  Right lower quadrant pain, UTI EXAM: CT ABDOMEN AND PELVIS WITH CONTRAST TECHNIQUE: Multidetector CT imaging of the  abdomen and pelvis was performed using the standard protocol following bolus administration of intravenous contrast. RADIATION DOSE REDUCTION: This exam was performed according to the departmental dose-optimization program which includes automated exposure control, adjustment of the mA and/or kV according to patient size and/or use of iterative reconstruction technique. CONTRAST:  123mL OMNIPAQUE IOHEXOL 300 MG/ML  SOLN COMPARISON:  CT abdomen and pelvis 06/28/2018 FINDINGS: Lower chest: No acute abnormality. Hepatobiliary: Liver is enlarged measuring 19.9 cm in length. Hypodense appearance of the parenchyma consistent with steatosis. No suspicious hepatic mass identified. Gallbladder appears normal. No biliary ductal dilatation. Pancreas: Unremarkable. No pancreatic ductal dilatation or surrounding inflammatory changes. Spleen: Normal in size without focal abnormality. Adrenals/Urinary Tract: Adrenal glands are unremarkable. Kidneys are normal, without renal calculi, focal lesion, or hydronephrosis. Bladder is unremarkable. Stomach/Bowel: Stomach is within normal limits. Appendix appears normal. No evidence of bowel wall thickening, distention, or inflammatory changes. Vascular/Lymphatic: No significant vascular findings are present. No enlarged abdominal or pelvic lymph nodes. Reproductive: Status post hysterectomy. No adnexal masses. Other: No ascites. Musculoskeletal: No acute or significant osseous findings. IMPRESSION: 1. No acute process identified. 2. Hepatomegaly and  hepatic steatosis. Electronically Signed   By: Ofilia Neas M.D.   On: 09/17/2021 14:13   ? ?Procedures ?Procedures  ? ? ?Medications Ordered in ED ?Medications  ?morphine (PF) 4 MG/ML injection 4 mg (4 mg Intra

## 2021-09-17 NOTE — Discharge Instructions (Signed)
The CAT scan today was normal.  Your blood work also looked normal.  Unclear what is causing your pain but may be related to irritation of where you had your prior surgery.  I want you to stop taking them Macrobid (nitrofurantoin) and we are starting you on a medication called doxycycline.  Continue the Flagyl.  You are given prescription naproxen for pain control.  If you start having fever, vomiting, worsening of your symptoms return to the emergency room. ?

## 2021-09-17 NOTE — ED Notes (Signed)
Attempted IV x2, unsuccessful. Patient tolerated well. Notified other staff nurse for assistance  ?

## 2021-09-17 NOTE — Telephone Encounter (Signed)
Pt states she is having pain on both sides of her abdomen and sudden sharp pains in the middle of her back. Also, she has been bleeding vaginally (had hysterectomy in 2015). Currently being triaged. ? ?

## 2021-09-17 NOTE — ED Triage Notes (Signed)
Pt arrived POV from home c/o lower abdominal pain that started Wednesday. Pt was seen and diagnosed with a UTI. But pt states the pain is getting worse and she is now having vaginal bleeding and had a hysterectomy done and states she should not be bleeding. ?

## 2021-09-20 ENCOUNTER — Telehealth: Payer: Self-pay | Admitting: Family Medicine

## 2021-09-20 ENCOUNTER — Encounter: Payer: Self-pay | Admitting: Physician Assistant

## 2021-09-20 ENCOUNTER — Ambulatory Visit (INDEPENDENT_AMBULATORY_CARE_PROVIDER_SITE_OTHER): Payer: 59 | Admitting: Physician Assistant

## 2021-09-20 VITALS — BP 118/80 | HR 91 | Temp 98.4°F | Ht 64.0 in | Wt 206.2 lb

## 2021-09-20 DIAGNOSIS — N939 Abnormal uterine and vaginal bleeding, unspecified: Secondary | ICD-10-CM | POA: Diagnosis not present

## 2021-09-20 DIAGNOSIS — R160 Hepatomegaly, not elsewhere classified: Secondary | ICD-10-CM | POA: Diagnosis not present

## 2021-09-20 DIAGNOSIS — K760 Fatty (change of) liver, not elsewhere classified: Secondary | ICD-10-CM | POA: Diagnosis not present

## 2021-09-20 DIAGNOSIS — R102 Pelvic and perineal pain: Secondary | ICD-10-CM

## 2021-09-20 MED ORDER — KETOROLAC TROMETHAMINE 60 MG/2ML IM SOLN
60.0000 mg | Freq: Once | INTRAMUSCULAR | Status: AC
  Administered 2021-09-20: 60 mg via INTRAMUSCULAR

## 2021-09-20 NOTE — Progress Notes (Signed)
Renee Knapp is a 46 y.o. female here for abdominal pain. ? ?History of Present Illness:  ? ?Chief Complaint  ?Patient presents with  ? Abdominal Pain  ? ? ?Abdominal Pain ?On 09/15/21, pt presented to the UC with c/o lower abdominal pain and urinary frequency that became onset that morning. Pt described the abdominal pain as a constant sharp sensation that she felt was coming from her ovaries. In addition to this, pt also noted that she has two separate days where she experienced spontaneous vaginal spotting. Pt has a hx of partial hysterectomy in 2015, which she states was for ovarian cysts; still has her ovaries. ? ?Additionally her urine showed evidence of a small amount of infection. As a result she was believed to have a UTI and bacterial vaginosis which led to her being prescribed MetroGel 0.75% 1 suppository nightly and Macrobid 100 mg twice daily. She took these as instructed. ? ?Two days later she went to the ER due to worsening abdominal pain and bleeding. ? ?Denied dysuria, frequency, urgency, abnormal BM, or worsening triggers. She did experience some nausea but no vomiting. Due to nature of sx, she underwent further examination including routine lab work, abdominal/pelvic exam, and CT scan. Routine lab work was WNL; CT scan was normal. ? ?Due to her pelvic tenderness, pt was instructed to discontinue macrobid but continue flagyl. She was also prescribed doxycycline 100 mg twice daily x 10 days due to pelvic pain and naproxen 500 mg twice daily for pain.  ? ?Currently pt states that although she has been compliant with the medication, the doxycycline causes mild nausea and the naproxen doesn't help with her pain. Fortunately her vaginal bleeding has stopped, but she expresses frustration and confusion as to why this is occurring and what could be done to remedy this.  ? ?She is not sexually active. ? ?She did have incidental finding of hepatic steatosis and hepatomegaly on CT scan. ? ?Past Medical  History:  ?Diagnosis Date  ? Anemia   ? History  ? Diabetes mellitus without complication ([place])   ? diet and exercise controlled - no meds  ? Hypertension   ? Obesity   ? Plantar fasciitis, bilateral   ? History - recvd cortisone injections bilateral- no current prob as 01/2014  ? SVD (spontaneous vaginal delivery)   ? x 2  ? ?  ?Social History  ? ?Tobacco Use  ? Smoking status: Never  ? Smokeless tobacco: Never  ?Vaping Use  ? Vaping Use: Never used  ?Substance Use Topics  ? Alcohol use: No  ? Drug use: No  ? ? ?Past Surgical History:  ?Procedure Laterality Date  ? CYSTOSCOPY N/A 02/24/2014  ? Procedure: CYSTOSCOPY;  Surgeon: Sanjuana Kava, MD;  Location: [facility];  Service: Gynecology;  Laterality: N/A;  ? DILATION AND CURETTAGE OF UTERUS    ? MAB  ? LAPAROSCOPIC ASSISTED VAGINAL HYSTERECTOMY N/A 02/24/2014  ? Procedure: LAPAROSCOPIC ASSISTED VAGINAL HYSTERECTOMY WITH BILATERAL SALPINGECTOMY ;  Surgeon: Sanjuana Kava, MD;  Location: [facility];  Service: Gynecology;  Laterality: N/A;  ? TUBAL LIGATION    ? WISDOM TOOTH EXTRACTION    ? ? ?Family History  ?Problem Relation Age of Onset  ? Cancer Other   ? Hypertension Other   ? Diabetes Other   ? Diabetes Mother   ? Heart disease Mother   ? Hypertension Mother   ? Hypertension Father   ? High Cholesterol Father   ? Hypertension Sister   ? Kidney disease  Sister   ? Stroke Sister   ? Hypertension Sister   ? Hearing loss Son   ? ? ?Allergies  ?Allergen Reactions  ? Bee Venom Anaphylaxis  ? Reglan [Metoclopramide] Nausea And Vomiting and Anxiety  ? ? ?Current Medications:  ? ?Current Outpatient Medications:  ?  Haematologist lancets, USE 1 SINGLE USE LANCETS AS NEEDED TO CHECK BLOOD SUGAR, Disp: 100 each, Rfl: 1 ?  CONTOUR NEXT TEST test strip, USE 1 LANCETS AS NEEDED TO CHECK BLOOD SUGAR, Disp: 100 strip, Rfl: 1 ?  doxycycline (VIBRAMYCIN) 100 MG capsule, Take 1 capsule (100 mg total) by mouth 2 (two) times daily., Disp: 20 capsule, Rfl: 0 ?  EPINEPHrine (EPIPEN 2-PAK) 0.3  mg/0.3 mL IJ SOAJ injection, Inject 0.3 mg into the muscle as needed for anaphylaxis., Disp: 2 each, Rfl: 1 ?  metFORMIN (GLUCOPHAGE-XR) 500 MG 24 hr tablet, Take 2 tablets (1,000 mg total) by mouth 2 (two) times daily., Disp: 360 tablet, Rfl: 1 ?  naproxen (NAPROSYN) 500 MG tablet, Take 1 tablet (500 mg total) by mouth 2 (two) times daily., Disp: 20 tablet, Rfl: 0 ?  OZEMPIC, 0.25 OR 0.5 MG/DOSE, 2 MG/1.5ML SOPN, INJECT 0.5 MG INTO THE SKIN ONCE A WEEK., Disp: 1.5 mL, Rfl: 1 ?  rosuvastatin (CRESTOR) 10 MG tablet, Take 1 tablet (10 mg total) by mouth daily., Disp: 90 tablet, Rfl: 3 ?  Vitamin D, Cholecalciferol, 25 MCG (1000 UT) TABS, Take 1,000 Units by mouth daily. gummy, Disp: , Rfl:  ?  Vitamin D, Ergocalciferol, (DRISDOL) 1.25 MG (50000 UNIT) CAPS capsule, TAKE 1 CAPSULE BY MOUTH ONCE A WEEK FOR 12 WEEKS THEN OTC 4000 UNITS/DAY, Disp: 12 capsule, Rfl: 0 ?  irbesartan (AVAPRO) 75 MG tablet, TAKE 1 TABLET BY MOUTH EVERY DAY (Patient not taking: Reported on 08/12/2021), Disp: 90 tablet, Rfl: 0  ? ?Review of Systems:  ? ?Review of Systems  ?Gastrointestinal:  Positive for abdominal pain.  ?Negative unless otherwise specified per HPI. ?Vitals:  ? ?Vitals:  ? 09/20/21 1536  ?BP: 118/80  ?Pulse: 91  ?Temp: 98.4 ?F (36.9 ?C)  ?TempSrc: Temporal  ?SpO2: 96%  ?Weight: 206 lb 4 oz (93.6 kg)  ?Height: 5\' 4"  (1.626 m)  ?   ?Body mass index is 35.4 kg/m?. ? ?Physical Exam:  ? ?Physical Exam ?Vitals and nursing note reviewed.  ?Constitutional:   ?   General: She is not in acute distress. ?   Appearance: She is well-developed. She is not ill-appearing or toxic-appearing.  ?Cardiovascular:  ?   Rate and Rhythm: Normal rate and regular rhythm.  ?   Pulses: Normal pulses.  ?   Heart sounds: Normal heart sounds, S1 normal and S2 normal.  ?Pulmonary:  ?   Effort: Pulmonary effort is normal.  ?   Breath sounds: Normal breath sounds.  ?Abdominal:  ?   General: Abdomen is flat. Bowel sounds are normal.  ?   Palpations: Abdomen is  soft.  ?   Tenderness: There is abdominal tenderness in the right lower quadrant. There is no right CVA tenderness, left CVA tenderness, guarding or rebound.  ?Skin: ?   General: Skin is warm and dry.  ?Neurological:  ?   Mental Status: She is alert.  ?   GCS: GCS eye subscore is 4. GCS verbal subscore is 5. GCS motor subscore is 6.  ?Psychiatric:     ?   Speech: Speech normal.     ?   Behavior: Behavior normal. Behavior is  cooperative.  ? ? ?Assessment and Plan:  ? ?Pelvic pain; Vaginal bleeding ?Unclear etiology ?Administered toradol 60 mg injection today, patient tolerated well  ?Continue doxycycline 100 mg twice daily, hold naproxen 500 mg twice daily  ?Placed urgent referral to [facility] gynecology for further evaluation -- was able to get patient scheduled for 05/02 @ 3pm with Dr. Talbert Nan. Work note extended to that time. Worsening symptoms in the meantime -- recommend ER evaluation. ?Work note extended through tomorrow ? ?Hepatomegaly; Hepatic steatosis ?She is non-tender over liver/gallbladder ?She has scheduled follow-up with PCP later this month -- defer further work-up and evaluation to him ? ?I,Havlyn C Ratchford,acting as a scribe for [employer], PA.,have documented all relevant documentation on the behalf of Inda Coke, PA,as directed by  Inda Coke, PA while in the presence of Inda Coke, [place]. ? ?IInda Coke, PA, have reviewed all documentation for this visit. The documentation on 09/20/21 for the exam, diagnosis, procedures, and orders are all accurate and complete. ? ? ?Inda Coke, PA-C ? ?

## 2021-09-20 NOTE — Telephone Encounter (Signed)
Pt has called back in and asked to see someone today due to the pain she is experiencing. I have scheduled her with Renee Knapp at 3:40pm today.  ?

## 2021-09-20 NOTE — Telephone Encounter (Signed)
Pt states she was in ED on 4/28 and they told her to see a gyno but pt does not have one. She stated ED nor urgent care could pinpoint the problem. ?

## 2021-09-20 NOTE — Telephone Encounter (Signed)
Pt has appt with Lelon Mast today at 3:40pm ?

## 2021-09-20 NOTE — Patient Instructions (Signed)
It was great to see you! ? ?Please go to [facility] tomorrow at 2:30! You will need to be there by 2:30 to complete all new patient paperwork. Dr. Oscar La will take great care of you! ? ?Take care, ? ?Jarold Motto PA-C  ?

## 2021-09-20 NOTE — Telephone Encounter (Signed)
..  Reason for Referral Request:  Bleeding and abdominal pain ? ?Has Patient been seen by PCP for this complaint? ? ?No, Please schedule patient for appointment for complaint. ? ?Yes, Please find out following information: ? ?Reason: Does not have a obgyn needs one asap ? ?Referral to which Specialty: Obgyn ? ?Preferred office/ provider:   ?

## 2021-09-20 NOTE — Telephone Encounter (Signed)
Noted  

## 2021-09-21 ENCOUNTER — Ambulatory Visit (INDEPENDENT_AMBULATORY_CARE_PROVIDER_SITE_OTHER): Payer: 59 | Admitting: Obstetrics and Gynecology

## 2021-09-21 ENCOUNTER — Ambulatory Visit (INDEPENDENT_AMBULATORY_CARE_PROVIDER_SITE_OTHER): Payer: 59

## 2021-09-21 ENCOUNTER — Encounter: Payer: Self-pay | Admitting: Obstetrics and Gynecology

## 2021-09-21 ENCOUNTER — Other Ambulatory Visit: Payer: Self-pay | Admitting: Obstetrics and Gynecology

## 2021-09-21 VITALS — BP 110/70 | HR 72 | Ht 64.0 in | Wt 207.0 lb

## 2021-09-21 DIAGNOSIS — N898 Other specified noninflammatory disorders of vagina: Secondary | ICD-10-CM | POA: Diagnosis not present

## 2021-09-21 DIAGNOSIS — Z8742 Personal history of other diseases of the female genital tract: Secondary | ICD-10-CM

## 2021-09-21 DIAGNOSIS — R102 Pelvic and perineal pain: Secondary | ICD-10-CM | POA: Diagnosis not present

## 2021-09-21 DIAGNOSIS — R109 Unspecified abdominal pain: Secondary | ICD-10-CM

## 2021-09-21 DIAGNOSIS — Z90710 Acquired absence of both cervix and uterus: Secondary | ICD-10-CM

## 2021-09-21 DIAGNOSIS — B3731 Acute candidiasis of vulva and vagina: Secondary | ICD-10-CM | POA: Diagnosis not present

## 2021-09-21 LAB — WET PREP FOR TRICH, YEAST, CLUE

## 2021-09-21 MED ORDER — FLUCONAZOLE 150 MG PO TABS: 150.0000 mg | ORAL_TABLET | Freq: Once | ORAL | 0 refills | Status: AC

## 2021-09-21 MED ORDER — IBUPROFEN 800 MG PO TABS: 800.0000 mg | ORAL_TABLET | Freq: Three times a day (TID) | ORAL | 1 refills | Status: DC | PRN

## 2021-09-21 NOTE — Patient Instructions (Signed)

## 2021-09-21 NOTE — Progress Notes (Signed)
46 y.o. [id number] Divorced Black or African American Not Hispanic or Latino female here for evaluation of pelvic pain and vaginal bleeding. ?H/O DM, anemia & HTN ?H/O LAVH/BS in 10/15.  ? ?Seen at Urgent Care on 09/15/21: diagnosed with BV and UTI. Minimal blood was seen in her vagina.  ? ?Seen in the ER on 09/17/21, etiology of her pain wasn't clear. Taken off of Macrobid, no signs of UTI.  ? ?09/17/21 labs: ?Urinalysis negative ?WBC 7.4 ?CMP normal other than glucose of 174 ?Lipase 44 ? ?09/17/21 CT: ?Reproductive: Status post hysterectomy. No adnexal masses. ?IMPRESSION: ?1. No acute process identified. ?2. Hepatomegaly and hepatic steatosis. ? ?Her pain started on 4/26. The pain is a constant, cramping pain, that ranges from a 7.5-9.5/10. The pain was originally in [place], now low in the RLQ.  ?She has 1-2 BM's a day, normal for her. No bladder c/o. ? ?She c/o vaginal spotting starting a month ago, has happened a couple of times since then. She was given metrogel for BV, only used it 2 x. No blood in the last few days.  ? ?Not sexually active since 2015.  ?  ? ?Patient's last menstrual period was 02/06/2014 (lmp unknown).          ?Sexually active: No.  ?The current method of family planning is tubal ligation and status post hysterectomy.    ?Exercising: Yes.     Walking  ?Smoker:  no ? ?Health Maintenance: ?Pap:  Hx Hysterectomy  ?History of abnormal Pap:  no ?MMG:  2022 through work. Was normal  ?BMD:   none  ?Colonoscopy: at 5  ?TDaP:  2019  ?Gardasil: none  ? ? reports that she has never smoked. She has never used smokeless tobacco. She reports that she does not drink alcohol and does not use drugs. ? ?Past Medical History:  ?Diagnosis Date  ? Anemia   ? History  ? Diabetes mellitus without complication ([place])   ? diet and exercise controlled - no meds  ? Hypertension   ? Obesity   ? Plantar fasciitis, bilateral   ? History - recvd cortisone injections bilateral- no current prob as 01/2014  ? SVD (spontaneous vaginal  delivery)   ? x 2  ? ? ?Past Surgical History:  ?Procedure Laterality Date  ? CYSTOSCOPY N/A 02/24/2014  ? Procedure: CYSTOSCOPY;  Surgeon: Sanjuana Kava, MD;  Location: [facility];  Service: Gynecology;  Laterality: N/A;  ? DILATION AND CURETTAGE OF UTERUS    ? MAB  ? LAPAROSCOPIC ASSISTED VAGINAL HYSTERECTOMY N/A 02/24/2014  ? Procedure: LAPAROSCOPIC ASSISTED VAGINAL HYSTERECTOMY WITH BILATERAL SALPINGECTOMY ;  Surgeon: Sanjuana Kava, MD;  Location: [facility];  Service: Gynecology;  Laterality: N/A;  ? TUBAL LIGATION    ? WISDOM TOOTH EXTRACTION    ? ? ?Current Outpatient Medications  ?Medication Sig Dispense Refill  ? Bayer Microlet Lancets lancets USE 1 SINGLE USE LANCETS AS NEEDED TO CHECK BLOOD SUGAR 100 each 1  ? CONTOUR NEXT TEST test strip USE 1 LANCETS AS NEEDED TO CHECK BLOOD SUGAR 100 strip 1  ? doxycycline (VIBRAMYCIN) 100 MG capsule Take 1 capsule (100 mg total) by mouth 2 (two) times daily. 20 capsule 0  ? EPINEPHrine (EPIPEN 2-PAK) 0.3 mg/0.3 mL IJ SOAJ injection Inject 0.3 mg into the muscle as needed for anaphylaxis. 2 each 1  ? irbesartan (AVAPRO) 75 MG tablet TAKE 1 TABLET BY MOUTH EVERY DAY (Patient not taking: Reported on 08/12/2021) 90 tablet 0  ? metFORMIN (GLUCOPHAGE-XR) 500 MG  24 hr tablet Take 2 tablets (1,000 mg total) by mouth 2 (two) times daily. 360 tablet 1  ? naproxen (NAPROSYN) 500 MG tablet Take 1 tablet (500 mg total) by mouth 2 (two) times daily. 20 tablet 0  ? OZEMPIC, 0.25 OR 0.5 MG/DOSE, 2 MG/1.5ML SOPN INJECT 0.5 MG INTO THE SKIN ONCE A WEEK. 1.5 mL 1  ? rosuvastatin (CRESTOR) 10 MG tablet Take 1 tablet (10 mg total) by mouth daily. 90 tablet 3  ? Vitamin D, Cholecalciferol, 25 MCG (1000 UT) TABS Take 1,000 Units by mouth daily. gummy    ? Vitamin D, Ergocalciferol, (DRISDOL) 1.25 MG (50000 UNIT) CAPS capsule TAKE 1 CAPSULE BY MOUTH ONCE A WEEK FOR 12 WEEKS THEN OTC 4000 UNITS/DAY 12 capsule 0  ? ?No current facility-administered medications for this visit.  ? ? ?Family History  ?Problem  Relation Age of Onset  ? Cancer Other   ? Hypertension Other   ? Diabetes Other   ? Diabetes Mother   ? Heart disease Mother   ? Hypertension Mother   ? Hypertension Father   ? High Cholesterol Father   ? Hypertension Sister   ? Kidney disease Sister   ? Stroke Sister   ? Hypertension Sister   ? Hearing loss Son   ? ? ?Review of Systems  ?Genitourinary:  Positive for pelvic pain and vaginal bleeding.  ?All other systems reviewed and are negative. ? ?Exam:   ?LMP 02/06/2014 (LMP Unknown)   Weight change: @WEIGHTCHANGE @ Height:      ?Ht Readings from Last 3 Encounters:  ?09/20/21 5\' 4"  (1.626 m)  ?09/17/21 5\' 4"  (1.626 m)  ?08/12/21 5\' 4"  (1.626 m)  ? ? ?General appearance: alert, cooperative and appears stated age ?Abdomen: soft, tender in the RLQ, no rebound, no guarding, non distended,  no masses,  no organomegaly ?Extremities: extremities normal, atraumatic, no cyanosis or edema ?Skin: Skin color, texture, turgor normal. No rashes or lesions ?Lymph nodes: Cervical, supraclavicular, and axillary nodes normal. ?No abnormal inguinal nodes palpated ?Neurologic: Grossly normal ? ? ?Pelvic: External genitalia:  no lesions ?             Urethra:  normal appearing urethra with no masses, tenderness or lesions ?             Bartholins and Skenes: normal    ?             Vagina: normal appearing vagina with slight erythema at the cuff, no blood, not friable, no abnormal d/c ?             Cervix: absent ?              ?Bimanual Exam:  Uterus:  uterus absent ?             Adnexa:  no masses, bilateral mild adnexal tenderness ?              Rectovaginal: Confirms ?              Anus:  normal sphincter tone, no lesions ? Pelvic floor: tender on the right ? ?Gae Dry chaperoned for the exam. ? ?1. Combined abdominal and pelvic pain ?She doesn't have an acute abdomen. Normal WBC, no concerning findings on CT, no uterus so can't have PID.  ?- US PELVIS TRANSVAGINAL NON-OB (TV ONLY); Future ?- ibuprofen (ADVIL) 800 MG tablet;  Take 1 tablet (800 mg total) by mouth every 8 (eight) hours as needed.  Dispense: 30 tablet;  Refill: 1 ? ?2. History of vaginitis ?- WET PREP FOR TRICH, YEAST, CLUE ? ?3. Vaginal irritation ?- WET PREP FOR TRICH, YEAST, CLUE ? ?4. Yeast vaginitis ?- fluconazole (DIFLUCAN) 150 MG tablet; Take 1 tablet (150 mg total) by mouth once for 1 dose. Take one tablet.  Repeat in 72 hours if symptoms are not completely resolved.  Dispense: 2 tablet; Refill: 0 ? ?5. History of laparoscopic-assisted vaginal hysterectomy ? ? ?Addendum: ?Ultrasound shows a lot of stool and gas, ovaries are not identified. ?Uterus is surgically absent.  ? ?Recommended she try a laxative and f/u with her primary.  ?

## 2021-10-09 ENCOUNTER — Emergency Department (HOSPITAL_BASED_OUTPATIENT_CLINIC_OR_DEPARTMENT_OTHER)
Admission: EM | Admit: 2021-10-09 | Discharge: 2021-10-09 | Disposition: A | Discharge status: Home / Self Care | Payer: 59 | Attending: Emergency Medicine | Admitting: Emergency Medicine

## 2021-10-09 ENCOUNTER — Emergency Department (HOSPITAL_BASED_OUTPATIENT_CLINIC_OR_DEPARTMENT_OTHER): Payer: 59

## 2021-10-09 ENCOUNTER — Other Ambulatory Visit: Payer: Self-pay

## 2021-10-09 ENCOUNTER — Encounter (HOSPITAL_BASED_OUTPATIENT_CLINIC_OR_DEPARTMENT_OTHER): Payer: Self-pay | Admitting: Emergency Medicine

## 2021-10-09 DIAGNOSIS — Z7984 Long term (current) use of oral hypoglycemic drugs: Secondary | ICD-10-CM | POA: Insufficient documentation

## 2021-10-09 DIAGNOSIS — F419 Anxiety disorder, unspecified: Secondary | ICD-10-CM | POA: Insufficient documentation

## 2021-10-09 DIAGNOSIS — R258 Other abnormal involuntary movements: Secondary | ICD-10-CM | POA: Insufficient documentation

## 2021-10-09 DIAGNOSIS — R42 Dizziness and giddiness: Secondary | ICD-10-CM | POA: Insufficient documentation

## 2021-10-09 DIAGNOSIS — R0789 Other chest pain: Secondary | ICD-10-CM | POA: Diagnosis present

## 2021-10-09 LAB — BASIC METABOLIC PANEL
Anion gap: 10 (ref 5–15)
BUN: 13 mg/dL (ref 6–20)
CO2: 27 mmol/L (ref 22–32)
Calcium: 9.7 mg/dL (ref 8.9–10.3)
Chloride: 103 mmol/L (ref 98–111)
Creatinine, Ser: 0.85 mg/dL (ref 0.44–1.00)
GFR, Estimated: >60 mL/min (ref >60)
Glucose, Bld: 101 mg/dL — ABNORMAL HIGH (ref 70–99)
Potassium: 3.8 mmol/L (ref 3.5–5.1)
Sodium: 140 mmol/L (ref 135–145)

## 2021-10-09 LAB — CBC
HCT: 42.5 % (ref 36.0–46.0)
Hemoglobin: 13.6 g/dL (ref 12.0–15.0)
MCH: 26.5 pg (ref 26.0–34.0)
MCHC: 32 g/dL (ref 30.0–36.0)
MCV: 82.7 fL (ref 80.0–100.0)
Platelets: 342 10*3/uL (ref 150–400)
RBC: 5.14 MIL/uL — ABNORMAL HIGH (ref 3.87–5.11)
RDW: 13.3 % (ref 11.5–15.5)
WBC: 7.5 10*3/uL (ref 4.0–10.5)
nRBC: 0 % (ref 0.0–0.2)

## 2021-10-09 LAB — TROPONIN I (HIGH SENSITIVITY): Troponin I (High Sensitivity): 3 ng/L (ref <18)

## 2021-10-09 MED ORDER — HYDROXYZINE HCL 25 MG PO TABS: 25.0000 mg | ORAL_TABLET | Freq: Four times a day (QID) | ORAL | 0 refills | Status: DC

## 2021-10-09 MED ORDER — HYDROXYZINE HCL 25 MG PO TABS
25.0000 mg | ORAL_TABLET | Freq: Once | ORAL | Status: AC
  Administered 2021-10-09: 25 mg via ORAL
  Filled 2021-10-09: qty 1

## 2021-10-09 NOTE — Discharge Instructions (Addendum)
It was a pleasure taking care of you today!   Your workup was negative in the ED. You may apply ice or heat to the affected area for 15 minutes at a time.  Ensure to place a barrier between your skin and and the ice.  You may use over-the-counter 1,000 mg Tylenol every 6 hours or 600 mg ibuprofen every 6 hours as needed for pain for no more than 7 days.  You will be sent a prescription for Atarax, you may take as prescribed for increased anxiety.  Maintain your scheduled follow-up appointment with your primary care provider on 10/11/2021.  You may return to the ED if you are experiencing increasing/worsening chest pain, shortness of breath, or worsening symptoms.

## 2021-10-09 NOTE — ED Notes (Signed)
PA at the Bedside. ?

## 2021-10-09 NOTE — ED Triage Notes (Signed)
[facility] called for CP secondary to a panic attack per pt.  She has had similar episodes in the past.  Pt was hyperventilating at the scene and per EMS was able to be calmed.  100% RA, hr 72, cbg 127, bp 146/82  Pt reports feeling of heaviness and fatigue.  Pt reports chest pressure continues.  EKG unremarkable per EMS en route.

## 2021-10-09 NOTE — ED Notes (Signed)
Imaging at the Bedside.
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
## 2021-10-11 ENCOUNTER — Encounter: Payer: Self-pay | Admitting: Family Medicine

## 2021-10-11 ENCOUNTER — Ambulatory Visit (INDEPENDENT_AMBULATORY_CARE_PROVIDER_SITE_OTHER): Payer: 59 | Admitting: Family Medicine

## 2021-10-11 VITALS — BP 135/89 | HR 92 | Temp 98.4°F | Ht 64.0 in | Wt 203.4 lb

## 2021-10-11 DIAGNOSIS — E559 Vitamin D deficiency, unspecified: Secondary | ICD-10-CM | POA: Diagnosis not present

## 2021-10-11 DIAGNOSIS — F411 Generalized anxiety disorder: Secondary | ICD-10-CM

## 2021-10-11 DIAGNOSIS — I152 Hypertension secondary to endocrine disorders: Secondary | ICD-10-CM | POA: Diagnosis not present

## 2021-10-11 DIAGNOSIS — Z0001 Encounter for general adult medical examination with abnormal findings: Secondary | ICD-10-CM | POA: Diagnosis not present

## 2021-10-11 DIAGNOSIS — E1159 Type 2 diabetes mellitus with other circulatory complications: Secondary | ICD-10-CM | POA: Diagnosis not present

## 2021-10-11 DIAGNOSIS — E1169 Type 2 diabetes mellitus with other specified complication: Secondary | ICD-10-CM | POA: Diagnosis not present

## 2021-10-11 DIAGNOSIS — Z1159 Encounter for screening for other viral diseases: Secondary | ICD-10-CM

## 2021-10-11 DIAGNOSIS — E785 Hyperlipidemia, unspecified: Secondary | ICD-10-CM

## 2021-10-11 DIAGNOSIS — E119 Type 2 diabetes mellitus without complications: Secondary | ICD-10-CM

## 2021-10-11 DIAGNOSIS — Z1211 Encounter for screening for malignant neoplasm of colon: Secondary | ICD-10-CM

## 2021-10-11 DIAGNOSIS — H9193 Unspecified hearing loss, bilateral: Secondary | ICD-10-CM

## 2021-10-11 DIAGNOSIS — R300 Dysuria: Secondary | ICD-10-CM

## 2021-10-11 MED ORDER — ESCITALOPRAM OXALATE 5 MG PO TABS: 5.0000 mg | ORAL_TABLET | Freq: Every day | ORAL | 5 refills | Status: DC

## 2021-10-11 NOTE — Assessment & Plan Note (Signed)
Check A1c.  Continue metformin 1000 mg twice daily and Ozempic 0.5 mg once weekly.

## 2021-10-11 NOTE — Addendum Note (Signed)
Addended by: Laddie Aquas A on: 10/11/2021 03:52 PM   Modules accepted: Orders

## 2021-10-11 NOTE — Assessment & Plan Note (Signed)
Check vitamin D. 

## 2021-10-11 NOTE — Assessment & Plan Note (Signed)
At goal on irbesartan 75mg daily.  ?

## 2021-10-11 NOTE — Patient Instructions (Signed)
It was very nice to see you today!  We will check blood work today.  We will also check a urine sample.  Please try the Lexapro.  Send me a message in a few weeks to let me know how this is working for you.  I will see back in 6 months.  Please come back to see me sooner if needed.  Take care, Dr Jerline Pain  PLEASE NOTE:  If you had any lab tests please let us know if you have not heard back within a few days. You may see your results on mychart before we have a chance to review them but we will give you a call once they are reviewed by [place]. If we ordered any referrals today, please let us know if you have not heard from their office within the next week.   Please try these tips to maintain a healthy lifestyle:  Eat at least 3 REAL meals and 1-2 snacks per day.  Aim for no more than 5 hours between eating.  If you eat breakfast, please do so within one hour of getting up.   Each meal should contain half fruits/vegetables, one quarter protein, and one quarter carbs (no bigger than a computer mouse)  Cut down on sweet beverages. This includes juice, soda, and sweet tea.   Drink at least 1 glass of water with each meal and aim for at least 8 glasses per day  Exercise at least 150 minutes every week.    Preventive Care 36-4 Years Old, Female Preventive care refers to lifestyle choices and visits with your health care provider that can promote health and wellness. Preventive care visits are also called wellness exams. What can I expect for my preventive care visit? Counseling Your health care provider may ask you questions about your: Medical history, including: Past medical problems. Family medical history. Pregnancy history. Current health, including: Menstrual cycle. Method of birth control. Emotional well-being. Home life and relationship well-being. Sexual activity and sexual health. Lifestyle, including: Alcohol, nicotine or tobacco, and drug use. Access to firearms. Diet,  exercise, and sleep habits. Work and work Statistician. Sunscreen use. Safety issues such as seatbelt and bike helmet use. Physical exam Your health care provider will check your: Height and weight. These may be used to calculate your BMI (body mass index). BMI is a measurement that tells if you are at a healthy weight. Waist circumference. This measures the distance around your waistline. This measurement also tells if you are at a healthy weight and may help predict your risk of certain diseases, such as type 2 diabetes and high blood pressure. Heart rate and blood pressure. Body temperature. Skin for abnormal spots. What immunizations do I need?  Vaccines are usually given at various ages, according to a schedule. Your health care provider will recommend vaccines for you based on your age, medical history, and lifestyle or other factors, such as travel or where you work. What tests do I need? Screening Your health care provider may recommend screening tests for certain conditions. This may include: Lipid and cholesterol levels. Diabetes screening. This is done by checking your blood sugar (glucose) after you have not eaten for a while (fasting). Pelvic exam and Pap test. Hepatitis B test. Hepatitis C test. HIV (human immunodeficiency virus) test. STI (sexually transmitted infection) testing, if you are at risk. Lung cancer screening. Colorectal cancer screening. Mammogram. Talk with your health care provider about when you should start having regular mammograms. This may depend on whether  you have a family history of breast cancer. BRCA-related cancer screening. This may be done if you have a family history of breast, ovarian, tubal, or peritoneal cancers. Bone density scan. This is done to screen for osteoporosis. Talk with your health care provider about your test results, treatment options, and if necessary, the need for more tests. Follow these instructions at home: Eating and  drinking  Eat a diet that includes fresh fruits and vegetables, whole grains, lean protein, and low-fat dairy products. Take vitamin and mineral supplements as recommended by your health care provider. Do not drink alcohol if: Your health care provider tells you not to drink. You are pregnant, may be pregnant, or are planning to become pregnant. If you drink alcohol: Limit how much you have to 0-1 drink a day. Know how much alcohol is in your drink. In the [place] one drink equals one 12 oz bottle of beer (355 mL), one 5 oz glass of wine (148 mL), or one 1 oz glass of hard liquor (44 mL). Lifestyle Brush your teeth every morning and night with fluoride toothpaste. Floss one time each day. Exercise for at least 30 minutes 5 or more days each week. Do not use any products that contain nicotine or tobacco. These products include cigarettes, chewing tobacco, and vaping devices, such as e-cigarettes. If you need help quitting, ask your health care provider. Do not use drugs. If you are sexually active, practice safe sex. Use a condom or other form of protection to prevent STIs. If you do not wish to become pregnant, use a form of birth control. If you plan to become pregnant, see your health care provider for a prepregnancy visit. Take aspirin only as told by your health care provider. Make sure that you understand how much to take and what form to take. Work with your health care provider to find out whether it is safe and beneficial for you to take aspirin daily. Find healthy ways to manage stress, such as: Meditation, yoga, or listening to music. Journaling. Talking to a trusted person. Spending time with friends and family. Minimize exposure to UV radiation to reduce your risk of skin cancer. Safety Always wear your seat belt while driving or riding in a vehicle. Do not drive: If you have been drinking alcohol. Do not ride with someone who has been drinking. When you are tired or  distracted. While texting. If you have been using any mind-altering substances or drugs. Wear a helmet and other protective equipment during sports activities. If you have firearms in your house, make sure you follow all gun safety procedures. Seek help if you have been physically or sexually abused. What's next? Visit your health care provider once a year for an annual wellness visit. Ask your health care provider how often you should have your eyes and teeth checked. Stay up to date on all vaccines. This information is not intended to replace advice given to you by your health care provider. Make sure you discuss any questions you have with your health care provider. Document Revised: 11/04/2020 Document Reviewed: 11/04/2020 Elsevier Patient Education  [employer].

## 2021-10-11 NOTE — Assessment & Plan Note (Signed)
Continue Crestor 10 mg daily.  Check lipids today.  

## 2021-10-11 NOTE — Progress Notes (Signed)
Chief Complaint:  Renee Knapp is a 46 y.o. female who presents today for her annual comprehensive physical exam.    Assessment/Plan:  New/Acute Problems: Dysuria Check urine culture.  Failed Hearing screen We will refer to audiology.  Chronic Problems Addressed Today: Hypertension associated with diabetes (HCC) At goal on irbesartan 75 mg daily.  GAD (generalized anxiety disorder) had lengthy discussion with patient regarding her generalized anxiety and recent panic attack.  She feels like her anxiety is at a higher level now than it has been for several months.  We discussed treatment options.  We will start low-dose Lexapro 5 mg daily and she will follow-up with me in a few weeks via MyChart.  She also has hydroxyzine on hand to use as needed.  Discussed referral to see therapist however deferred for now.  Vitamin D deficiency Check vitamin D.  Dyslipidemia associated with type 2 diabetes mellitus (HCC) Continue Crestor 10 mg daily.  Check lipids today.  Controlled type 2 diabetes mellitus without complication, without long-term current use of insulin (HCC) Check A1c.  Continue metformin 1000 mg twice daily and Ozempic 0.5 mg once weekly.  Preventative Healthcare: Check labs.  She will get mammogram done soon.  We will refer for colonoscopy.  Patient Counseling(The following topics were reviewed and/or handout was given):  -Nutrition: Stressed importance of moderation in sodium/caffeine intake, saturated fat and cholesterol, caloric balance, sufficient intake of fresh fruits, vegetables, and fiber.  -Stressed the importance of regular exercise.   -Substance Abuse: Discussed cessation/primary prevention of tobacco, alcohol, or other drug use; driving or other dangerous activities under the influence; availability of treatment for abuse.   -Injury prevention: Discussed safety belts, safety helmets, smoke detector, smoking near bedding or upholstery.   -Sexuality: Discussed  sexually transmitted diseases, partner selection, use of condoms, avoidance of unintended pregnancy and contraceptive alternatives.   -Dental health: Discussed importance of regular tooth brushing, flossing, and dental visits.  -Health maintenance and immunizations reviewed. Please refer to Health maintenance section.  Return to care in 1 year for next preventative visit.     Subjective:  HPI:  She has no acute complaints today.  See A/P for status of chronic conditions.  She had a panic attack 2 days ago and went to the emergency room.  Work-up there was negative.  She was given a prescription for hydroxyzine and her symptoms improved.  She is doing okay today.  Still feels a bit anxious at baseline.  She is interested in trying another medication to help manage her anxiety symptoms.  She has been doing well until her panic attack a couple of days ago.  She has also noticed a little burning when she urinates and would like to be checked for a UTI.     10/11/2021    3:02 PM  Depression screen PHQ 2/9  Decreased Interest 0  Down, Depressed, Hopeless 0  PHQ - 2 Score 0    Health Maintenance Due  Topic Date Due   Hepatitis C Screening  Never done   COLONOSCOPY (Pts 45-72yrs Insurance coverage will need to be confirmed)  Never done   FOOT EXAM  02/15/2020   COVID-19 Vaccine (4 - Booster for Pfizer series) 05/16/2020   MAMMOGRAM  08/16/2020   OPHTHALMOLOGY EXAM  08/19/2020   HEMOGLOBIN A1C  04/10/2021   URINE MICROALBUMIN  10/08/2021     ROS: Per HPI, otherwise a complete review of systems was negative.   PMH:  The following were reviewed and  entered/updated in epic: Past Medical History:  Diagnosis Date   Anemia    History   Diabetes mellitus without complication (HCC)    diet and exercise controlled - no meds   Hypertension    Obesity    Plantar fasciitis, bilateral    History - recvd cortisone injections bilateral- no current prob as 01/2014   SVD (spontaneous vaginal  delivery)    x 2   Patient Active Problem List   Diagnosis Date Noted   Hypertension associated with diabetes (HCC) 03/10/2021   GAD (generalized anxiety disorder) 08/30/2018   Vitamin D deficiency 01/18/2018   Dyslipidemia associated with type 2 diabetes mellitus (HCC) 11/25/2017   Controlled type 2 diabetes mellitus without complication, without long-term current use of insulin (HCC) 11/10/2017   S/P laparoscopic assisted vaginal hysterectomy (LAVH) 02/24/2014   Past Surgical History:  Procedure Laterality Date   CYSTOSCOPY N/A 02/24/2014   Procedure: CYSTOSCOPY;  Surgeon: Essie HartWalda Pinn, MD;  Location: [facility];  Service: Gynecology;  Laterality: N/A;   DILATION AND CURETTAGE OF UTERUS     MAB   LAPAROSCOPIC ASSISTED VAGINAL HYSTERECTOMY N/A 02/24/2014   Procedure: LAPAROSCOPIC ASSISTED VAGINAL HYSTERECTOMY WITH BILATERAL SALPINGECTOMY ;  Surgeon: Essie HartWalda Pinn, MD;  Location: [facility];  Service: Gynecology;  Laterality: N/A;   TUBAL LIGATION     WISDOM TOOTH EXTRACTION      Family History  Problem Relation Age of Onset   Diabetes Mother    Heart disease Mother    Hypertension Mother    Hypertension Father    High Cholesterol Father    Hypertension Sister    Kidney disease Sister    Stroke Sister    Hypertension Sister    Breast cancer Maternal Aunt    Hearing loss Son    Cancer Other    Hypertension Other    Diabetes Other     Medications- reviewed and updated Current Outpatient Medications  Medication Sig Dispense Refill   Bayer Microlet Lancets lancets USE 1 SINGLE USE LANCETS AS NEEDED TO CHECK BLOOD SUGAR 100 each 1   CONTOUR NEXT TEST test strip USE 1 LANCETS AS NEEDED TO CHECK BLOOD SUGAR 100 strip 1   EPINEPHrine (EPIPEN 2-PAK) 0.3 mg/0.3 mL IJ SOAJ injection Inject 0.3 mg into the muscle as needed for anaphylaxis. 2 each 1   escitalopram (LEXAPRO) 5 MG tablet Take 1 tablet (5 mg total) by mouth daily. 30 tablet 5   hydrOXYzine (ATARAX) 25 MG tablet Take 1 tablet (25 mg  total) by mouth every 6 (six) hours. 12 tablet 0   ibuprofen (ADVIL) 800 MG tablet Take 1 tablet (800 mg total) by mouth every 8 (eight) hours as needed. 30 tablet 1   metFORMIN (GLUCOPHAGE-XR) 500 MG 24 hr tablet Take 2 tablets (1,000 mg total) by mouth 2 (two) times daily. 360 tablet 1   naproxen (NAPROSYN) 500 MG tablet Take 1 tablet (500 mg total) by mouth 2 (two) times daily. 20 tablet 0   nitrofurantoin, macrocrystal-monohydrate, (MACROBID) 100 MG capsule Take 100 mg by mouth 2 (two) times daily.     OZEMPIC, 0.25 OR 0.5 MG/DOSE, 2 MG/1.5ML SOPN INJECT 0.5 MG INTO THE SKIN ONCE A WEEK. 1.5 mL 1   rosuvastatin (CRESTOR) 10 MG tablet Take 1 tablet (10 mg total) by mouth daily. 90 tablet 3   Vitamin D, Cholecalciferol, 25 MCG (1000 UT) TABS Take 1,000 Units by mouth daily. gummy     Vitamin D, Ergocalciferol, (DRISDOL) 1.25 MG (50000 UNIT) CAPS  capsule TAKE 1 CAPSULE BY MOUTH ONCE A WEEK FOR 12 WEEKS THEN OTC 4000 UNITS/DAY 12 capsule 0   No current facility-administered medications for this visit.    Allergies-reviewed and updated Allergies  Allergen Reactions   Bee Venom Anaphylaxis   Reglan [Metoclopramide] Nausea And Vomiting and Anxiety    Social History   Socioeconomic History   Marital status: Divorced    Spouse name: Not on file   Number of children: Not on file   Years of education: Not on file   Highest education level: Not on file  Occupational History   Not on file  Tobacco Use   Smoking status: Never   Smokeless tobacco: Never  Vaping Use   Vaping Use: Never used  Substance and Sexual Activity   Alcohol use: No   Drug use: No   Sexual activity: Not Currently    Birth control/protection: Surgical  Other Topics Concern   Not on file  Social History Narrative   Not on file   Social Determinants of Health   Financial Resource Strain: Not on file  Food Insecurity: Not on file  Transportation Needs: Not on file  Physical Activity: Not on file  Stress: Not  on file  Social Connections: Not on file        Objective:  Physical Exam: BP 135/89   Pulse 92   Temp 98.4 F (36.9 C) (Temporal)   Ht 5\' 4"  (1.626 m)   Wt 203 lb 6.4 oz (92.3 kg)   LMP 02/06/2014 (LMP Unknown)   SpO2 97%   BMI 34.91 kg/m   Body mass index is 34.91 kg/m. Wt Readings from Last 3 Encounters:  10/11/21 203 lb 6.4 oz (92.3 kg)  10/09/21 207 lb 0.2 oz (93.9 kg)  09/21/21 207 lb (93.9 kg)   Gen: NAD, resting comfortably HEENT: TMs normal bilaterally. OP clear. No thyromegaly noted.  CV: RRR with no murmurs appreciated Pulm: NWOB, CTAB with no crackles, wheezes, or rhonchi GI: Normal bowel sounds present. Soft, Nontender, Nondistended. MSK: no edema, cyanosis, or clubbing noted Skin: warm, dry Neuro: CN2-12 grossly intact. Strength 5/5 in upper and lower extremities. Reflexes symmetric and intact bilaterally.  Psych: Normal affect and thought content     Monika Chestang M. 11/21/21, MD 10/11/2021 3:36 PM

## 2021-10-11 NOTE — Assessment & Plan Note (Signed)
had lengthy discussion with patient regarding her generalized anxiety and recent panic attack.  She feels like her anxiety is at a higher level now than it has been for several months.  We discussed treatment options.  We will start low-dose Lexapro 5 mg daily and she will follow-up with me in a few weeks via MyChart.  She also has hydroxyzine on hand to use as needed.  Discussed referral to see therapist however deferred for now.

## 2021-10-12 LAB — COMPREHENSIVE METABOLIC PANEL
ALT: 15 U/L (ref 0–35)
AST: 12 U/L (ref 0–37)
Albumin: 4.4 g/dL (ref 3.5–5.2)
Alkaline Phosphatase: 83 U/L (ref 39–117)
BUN: 16 mg/dL (ref 6–23)
CO2: 30 mEq/L (ref 19–32)
Calcium: 10.2 mg/dL (ref 8.4–10.5)
Chloride: 105 mEq/L (ref 96–112)
Creatinine, Ser: 0.94 mg/dL (ref 0.40–1.20)
GFR: 72.09 mL/min (ref >60.00)
Glucose, Bld: 100 mg/dL — ABNORMAL HIGH (ref 70–99)
Potassium: 4.2 mEq/L (ref 3.5–5.1)
Sodium: 140 mEq/L (ref 135–145)
Total Bilirubin: 0.3 mg/dL (ref 0.2–1.2)
Total Protein: 7.6 g/dL (ref 6.0–8.3)

## 2021-10-12 LAB — CBC
HCT: 41.4 % (ref 36.0–46.0)
Hemoglobin: 13.5 g/dL (ref 12.0–15.0)
MCHC: 32.7 g/dL (ref 30.0–36.0)
MCV: 82.9 fl (ref 78.0–100.0)
Platelets: 355 10*3/uL (ref 150.0–400.0)
RBC: 4.99 Mil/uL (ref 3.87–5.11)
RDW: 14 % (ref 11.5–15.5)
WBC: 9.3 10*3/uL (ref 4.0–10.5)

## 2021-10-12 LAB — HEMOGLOBIN A1C: Hgb A1c MFr Bld: 7.1 % — ABNORMAL HIGH (ref 4.6–6.5)

## 2021-10-12 LAB — VITAMIN D 25 HYDROXY (VIT D DEFICIENCY, FRACTURES): VITD: 30.86 ng/mL (ref 30.00–100.00)

## 2021-10-12 LAB — URINE CULTURE
MICRO NUMBER:: 13427589
SPECIMEN QUALITY:: ADEQUATE

## 2021-10-12 LAB — LIPID PANEL
Cholesterol: 218 mg/dL — ABNORMAL HIGH (ref 0–200)
HDL: 43.9 mg/dL (ref >39.00)
NonHDL: 174.05
Total CHOL/HDL Ratio: 5
Triglycerides: 291 mg/dL — ABNORMAL HIGH (ref 0.0–149.0)
VLDL: 58.2 mg/dL — ABNORMAL HIGH (ref 0.0–40.0)

## 2021-10-12 LAB — HEPATITIS C ANTIBODY
Hepatitis C Ab: NONREACTIVE
SIGNAL TO CUT-OFF: 0.34 (ref <1.00)

## 2021-10-12 LAB — LDL CHOLESTEROL, DIRECT: Direct LDL: 140 mg/dL

## 2021-10-12 LAB — TSH: TSH: 0.85 u[IU]/mL (ref 0.35–5.50)

## 2021-10-13 LAB — MICROALBUMIN / CREATININE URINE RATIO
Creatinine,U: 255.3 mg/dL
Microalb Creat Ratio: 0.8 mg/g (ref 0.0–30.0)
Microalb, Ur: 2 mg/dL — ABNORMAL HIGH (ref 0.0–1.9)

## 2021-10-14 ENCOUNTER — Telehealth: Payer: Self-pay

## 2021-10-14 NOTE — Telephone Encounter (Signed)
Patient has called back in regard.  I have given her Dr. Ellwood Handler response in regard.    Please send script for Ozempic 1 mg to [facility] on [address].  Please verify current dose of Crestor.  Looks like last script of Crestor sent was for 10mg .  Patient was not able to verify.   Patient would also like to know if there were any signs of a UTI.  Patient states she is still having discomfort towards the end of urination.

## 2021-10-14 NOTE — Progress Notes (Signed)
Please inform patient of the following:  Her blood sugar and cholesterol are a little elevated but everything else is stable.  Recommended we increase her Crestor to 10 mg daily.  Please send a new prescription if needed.  We should also increase her Ozempic to 1 mg weekly.  Please send them a prescription if needed.  I would like her to come back in 3 to 6 months and we can recheck at that time.  We can recheck everything else in a year.

## 2021-10-15 ENCOUNTER — Other Ambulatory Visit: Payer: Self-pay

## 2021-10-15 MED ORDER — SEMAGLUTIDE (1 MG/DOSE) 4 MG/3ML ~~LOC~~ SOPN: 1.0000 mg | PEN_INJECTOR | SUBCUTANEOUS | 0 refills | Status: DC

## 2021-10-15 NOTE — Telephone Encounter (Signed)
Her urine culture was negative for UTI.

## 2021-10-15 NOTE — Telephone Encounter (Signed)
Please advise, lab note didn't say anything about UTI; pt 10 mg Crestor already sent in and willing to send in dose increase 1 mg Ozempic if that's ok.

## 2021-10-15 NOTE — Telephone Encounter (Signed)
LVM for patient advised negative for UTI, and we sent in 1mg  [facility]. Also see she is already taking the 10mg  Crestor prescription as recommended in lab note. Advised of office cb number with any questions.

## 2021-11-02 ENCOUNTER — Other Ambulatory Visit: Payer: Self-pay | Admitting: Family Medicine

## 2021-11-08 ENCOUNTER — Other Ambulatory Visit: Payer: Self-pay | Admitting: Family Medicine

## 2021-12-09 ENCOUNTER — Ambulatory Visit (INDEPENDENT_AMBULATORY_CARE_PROVIDER_SITE_OTHER): Payer: 59

## 2021-12-09 ENCOUNTER — Encounter (HOSPITAL_COMMUNITY): Payer: Self-pay

## 2021-12-09 ENCOUNTER — Ambulatory Visit (HOSPITAL_COMMUNITY)
Admission: EM | Admit: 2021-12-09 | Discharge: 2021-12-09 | Disposition: A | Discharge status: Home / Self Care | Payer: 59 | Attending: Emergency Medicine | Admitting: Emergency Medicine

## 2021-12-09 DIAGNOSIS — M79601 Pain in right arm: Secondary | ICD-10-CM | POA: Diagnosis not present

## 2021-12-09 DIAGNOSIS — M25521 Pain in right elbow: Secondary | ICD-10-CM

## 2021-12-09 MED ORDER — IBUPROFEN 800 MG PO TABS
800.0000 mg | ORAL_TABLET | Freq: Once | ORAL | Status: AC
  Administered 2021-12-09: 800 mg via ORAL

## 2021-12-09 MED ORDER — IBUPROFEN 800 MG PO TABS
ORAL_TABLET | ORAL | Status: AC
  Filled 2021-12-09: qty 1

## 2021-12-09 NOTE — ED Triage Notes (Signed)
Patient presents to Urgent Care with complaints of right arm pain and tingling  since 4 days ago. Patient reports last year she had tennis elbow and had a cortisone shot and she is unsure if it is related.  Pt reports motrin and tylenol and ben gay for pain

## 2021-12-09 NOTE — ED Provider Notes (Signed)
[facility]    CSN: [id number] Arrival date & time: 12/09/21  0803      History   Chief Complaint Chief Complaint  Patient presents with   right arm pain    HPI Renee Knapp is a 46 y.o. female.   Presents with 4-day history of right arm pain. Reports pain to the medial right elbow that sometimes spreads into the forearm.  Has been getting worse over the last 2 days.  Reports some weakness of the arm due to pain.  Denies any numbness or tingling.  Pain is reproduced with flexion and extension of the elbow.  Denies any trauma or injury.  No bites or scratches. She tried ibuprofen 2 days ago without much relief.  Has been using IcyHot on the area.  History of tennis elbow last year for which she received steroid injection.  She does have an orthopedic specialist she follows with.  Past Medical History:  Diagnosis Date   Anemia    History   Diabetes mellitus without complication (HCC)    diet and exercise controlled - no meds   Hypertension    Obesity    Plantar fasciitis, bilateral    History - recvd cortisone injections bilateral- no current prob as 01/2014   SVD (spontaneous vaginal delivery)    x 2    Patient Active Problem List   Diagnosis Date Noted   Hypertension associated with diabetes (HCC) 03/10/2021   GAD (generalized anxiety disorder) 08/30/2018   Vitamin D deficiency 01/18/2018   Dyslipidemia associated with type 2 diabetes mellitus (HCC) 11/25/2017   Controlled type 2 diabetes mellitus without complication, without long-term current use of insulin (HCC) 11/10/2017   S/P laparoscopic assisted vaginal hysterectomy (LAVH) 02/24/2014    Past Surgical History:  Procedure Laterality Date   CYSTOSCOPY N/A 02/24/2014   Procedure: CYSTOSCOPY;  Surgeon: Essie Hart, MD;  Location: [facility];  Service: Gynecology;  Laterality: N/A;   DILATION AND CURETTAGE OF UTERUS     MAB   LAPAROSCOPIC ASSISTED VAGINAL HYSTERECTOMY N/A 02/24/2014   Procedure:  LAPAROSCOPIC ASSISTED VAGINAL HYSTERECTOMY WITH BILATERAL SALPINGECTOMY ;  Surgeon: Essie Hart, MD;  Location: [facility];  Service: Gynecology;  Laterality: N/A;   TUBAL LIGATION     WISDOM TOOTH EXTRACTION      OB History     Gravida  3   Para      Term      Preterm      AB  1   Living  2      SAB  1   IAB      Ectopic      Multiple      Live Births               Home Medications    Prior to Admission medications   Medication Sig Start Date End Date Taking? Authorizing [employer]vertising account planner lancets USE 1 SINGLE USE LANCETS AS NEEDED TO CHECK BLOOD SUGAR 04/24/19   [name], MD  CONTOUR NEXT TEST test strip USE 1 LANCETS AS NEEDED TO CHECK BLOOD SUGAR 03/30/20   [name], MD  EPINEPHrine (EPIPEN 2-PAK) 0.3 mg/0.3 mL IJ SOAJ injection Inject 0.3 mg into the muscle as needed for anaphylaxis. 10/08/20   [name], MD  escitalopram (LEXAPRO) 5 MG tablet TAKE 1 TABLET (5 MG TOTAL) BY MOUTH DAILY. 11/02/21   Ardith Dark, MD  hydrOXYzine (ATARAX) 25 MG tablet Take 1 tablet (25 mg total) by  mouth every 6 (six) hours. 10/09/21   Blue, Soijett A, PA-C  ibuprofen (ADVIL) 800 MG tablet Take 1 tablet (800 mg total) by mouth every 8 (eight) hours as needed. 09/21/21   Romualdo Bolk, MD  metFORMIN (GLUCOPHAGE-XR) 500 MG 24 hr tablet Take 2 tablets (1,000 mg total) by mouth 2 (two) times daily. 10/08/20   [name], MD  naproxen (NAPROSYN) 500 MG tablet Take 1 tablet (500 mg total) by mouth 2 (two) times daily. 09/17/21   Gwyneth Sprout, MD  nitrofurantoin, macrocrystal-monohydrate, (MACROBID) 100 MG capsule Take 100 mg by mouth 2 (two) times daily.    [provider]  OZEMPIC, 1 MG/DOSE, 4 MG/3ML SOPN INJECT 1MG  INTO THE SKIN ONCE A WEEK 11/08/21   11/10/21, MD  rosuvastatin (CRESTOR) 10 MG tablet Take 1 tablet (10 mg total) by mouth daily. 10/12/20   10/14/20, MD  Vitamin D, Cholecalciferol, 25 MCG (1000 UT) TABS Take 1,000  Units by mouth daily. gummy    [provider]  Vitamin D, Ergocalciferol, (DRISDOL) 1.25 MG (50000 UNIT) CAPS capsule TAKE 1 CAPSULE BY MOUTH ONCE A WEEK FOR 12 WEEKS THEN OTC 4000 UNITS/DAY 04/12/21   04/14/21, MD    Family History Family History  Problem Relation Age of Onset   Diabetes Mother    Heart disease Mother    Hypertension Mother    Hypertension Father    High Cholesterol Father    Hypertension Sister    Kidney disease Sister    Stroke Sister    Hypertension Sister    Breast cancer Maternal Aunt    Hearing loss Son    Cancer Other    Hypertension Other    Diabetes Other     Social History Social History   Tobacco Use   Smoking status: Never   Smokeless tobacco: Never  Vaping Use   Vaping Use: Never used  Substance Use Topics   Alcohol use: No   Drug use: No     Allergies   Bee venom and Reglan [metoclopramide]   Review of Systems Review of Systems Per HPI  Physical Exam Triage Vital Signs ED Triage Vitals [12/09/21 0829]  Enc Vitals Group     BP 121/85     Pulse Rate 89     Resp 18     Temp 98.3 F (36.8 C)     Temp src      SpO2 98 %     Weight      Height      Head Circumference      Peak Flow      Pain Score 9     Pain Loc      Pain Edu?      Excl. in GC?    No data found.  Updated Vital Signs BP 121/85 (BP Location: Left Arm)   Pulse 89   Temp 98.3 F (36.8 C)   Resp 18   LMP 02/06/2014 (LMP Unknown)   SpO2 98%     Physical Exam Musculoskeletal:     Comments: Full ROM of the right elbow.  Pain with flexion and extension.  She does have bony tenderness to the medial and lateral epicondyles.  Sensation intact distally, radial pulse 2+      UC Treatments / Results  Labs (all labs ordered are listed, but only abnormal results are displayed) Labs Reviewed - No data to display  EKG   Radiology DG Elbow Complete Right  Result  Date: 12/09/2021 CLINICAL DATA:  Arm pain EXAM: RIGHT ELBOW - COMPLETE  3+ VIEW COMPARISON:  None Available. FINDINGS: There is no evidence of fracture, dislocation, or joint effusion. There is no evidence of arthropathy or other focal bone abnormality. Soft tissues are unremarkable. IMPRESSION: Negative. Electronically Signed   By: Guadlupe Spanish M.D.   On: 12/09/2021 09:24    Procedures Procedures (including critical care time)  Medications Ordered in UC Medications  ibuprofen (ADVIL) tablet 800 mg (800 mg Oral Given 12/09/21 0910)    Initial Impression / Assessment and Plan / UC Course  I have reviewed the triage vital signs and the nursing notes.  Pertinent labs & imaging results that were available during my care of the patient were reviewed by me and considered in my medical decision making (see chart for details).  Could be tendonitis. Ibuprofen dose given in clinic with some improvement in pain. Right elbow x-ray negative. Recommend follow-up with her orthopedic specialist.  May need further imaging and/or additional corticosteroid shot.  She can try ibuprofen 800 mg every 6 hours.  Any worsening symptoms or if she develops numbness, tingling, weakness of the full right arm, she is to be seen in the emergency department. Patient agrees to plan she is discharged stable condition.  Final Clinical Impressions(s) / UC Diagnoses   Final diagnoses:  Right elbow pain     Discharge Instructions      I recommend taking 800 mg ibuprofen every 6 hours until you are able to follow-up with your orthopedic specialist.  Please go to the emergency department if you develop any worsening symptoms.     ED Prescriptions   None    PDMP not reviewed this encounter.   Kalob Bergen, Lurena Joiner, [place] 12/09/21 [phone]

## 2021-12-09 NOTE — Discharge Instructions (Addendum)
I recommend taking 800 mg ibuprofen every 6 hours until you are able to follow-up with your orthopedic specialist.  Please go to the emergency department if you develop any worsening symptoms.

## 2021-12-11 ENCOUNTER — Other Ambulatory Visit: Payer: Self-pay | Admitting: Family Medicine

## 2021-12-16 ENCOUNTER — Ambulatory Visit: Payer: 59 | Admitting: Audiologist

## 2021-12-20 ENCOUNTER — Ambulatory Visit: Payer: 59 | Attending: Audiologist | Admitting: Audiologist

## 2021-12-20 DIAGNOSIS — H9193 Unspecified hearing loss, bilateral: Secondary | ICD-10-CM | POA: Diagnosis present

## 2021-12-20 DIAGNOSIS — E119 Type 2 diabetes mellitus without complications: Secondary | ICD-10-CM | POA: Diagnosis not present

## 2021-12-20 NOTE — Procedures (Signed)
  Outpatient Audiology and [facility] [address] [place], [place]  [address] [phone]  AUDIOLOGICAL  EVALUATION  NAME: Renee Knapp     DOB:   08/01/1975      MRN: [id number]                                                                                     DATE: 12/20/2021     REFERENT: Ardith Dark, MD STATUS: Outpatient DIAGNOSIS: Decreased Hearing     History: [name] was seen for an audiological evaluation.  [name] is receiving a hearing evaluation due to concerns for difficulty hearing due to her family history of hearing loss and diabetes. [name] has difficulty hearing on the phone. This difficulty began gradually a year ago.  No pain or pressure reported in either ear. Tinnitus denied for both ears. [name] has no history of noise exposure.  Medical history positive for type II diabetes which is a risk factor for hearing loss. No other relevant case history reported.   Evaluation:  Otoscopy showed a clear view of the tympanic membranes, bilaterally Tympanometry results were consistent with normal middle ear pressure, bilaterally   Audiometric testing was completed using conventional audiometry with insert transducer. Speech Recognition Thresholds were  15dB in the right ear and  20dB in the left ear. Word Recognition was performed 40 dB SL, scored 100% in both ears. Pure tone thresholds show normal hearing in each ear. QuickSIN normal.   Results:  The test results were reviewed with [name]. She has normal hearing in each ear. The hearing in the high pitches is borderline. Recommend monitoring hearing every few years.    Recommendations: 1.   No further audiologic testing is needed unless future hearing concerns arise. Monitor hearing every few years due to diabetes increasing risk for hearing loss.   31 minutes spent testing and counseling on results.   Ammie Ferrier  Audiologist, Au.D., CCC-A 12/20/2021  3:27 PM  Cc: Ardith Dark, MD

## 2021-12-27 ENCOUNTER — Other Ambulatory Visit: Payer: Self-pay | Admitting: Sports Medicine

## 2021-12-27 DIAGNOSIS — M5412 Radiculopathy, cervical region: Secondary | ICD-10-CM

## 2022-01-02 ENCOUNTER — Other Ambulatory Visit: Payer: 59

## 2022-01-10 ENCOUNTER — Other Ambulatory Visit: Payer: 59

## 2022-02-05 ENCOUNTER — Other Ambulatory Visit: Payer: Self-pay | Admitting: Family Medicine

## 2022-02-07 ENCOUNTER — Encounter: Payer: Self-pay | Admitting: Family Medicine

## 2022-02-07 ENCOUNTER — Ambulatory Visit (INDEPENDENT_AMBULATORY_CARE_PROVIDER_SITE_OTHER): Payer: Commercial Managed Care - HMO | Admitting: Family Medicine

## 2022-02-07 VITALS — BP 121/85 | HR 72 | Temp 98.5°F | Ht 64.0 in | Wt 206.6 lb

## 2022-02-07 DIAGNOSIS — J029 Acute pharyngitis, unspecified: Secondary | ICD-10-CM

## 2022-02-07 DIAGNOSIS — E119 Type 2 diabetes mellitus without complications: Secondary | ICD-10-CM

## 2022-02-07 DIAGNOSIS — I152 Hypertension secondary to endocrine disorders: Secondary | ICD-10-CM

## 2022-02-07 DIAGNOSIS — E1159 Type 2 diabetes mellitus with other circulatory complications: Secondary | ICD-10-CM | POA: Diagnosis not present

## 2022-02-07 LAB — POCT GLYCOSYLATED HEMOGLOBIN (HGB A1C): Hemoglobin A1C: 6.8 % — AB (ref 4.0–5.6)

## 2022-02-07 LAB — POC COVID19 BINAXNOW: SARS Coronavirus 2 Ag: NEGATIVE

## 2022-02-07 LAB — POCT RAPID STREP A (OFFICE): Rapid Strep A Screen: POSITIVE — AB

## 2022-02-07 MED ORDER — AMOXICILLIN 875 MG PO TABS: 875.0000 mg | ORAL_TABLET | Freq: Two times a day (BID) | ORAL | 0 refills | Status: DC

## 2022-02-07 MED ORDER — OZEMPIC (1 MG/DOSE) 4 MG/3ML ~~LOC~~ SOPN: PEN_INJECTOR | SUBCUTANEOUS | 1 refills | Status: DC

## 2022-02-07 NOTE — Progress Notes (Signed)
   Renee Knapp is a 46 y.o. female who presents today for an office visit.  Assessment/Plan:  New/Acute Problems: Strep Throat Rapid strep positive.  Start amoxicillin.  Can continue oral hydration and over-the-counter meds as needed.  She will let me know if not improving  Chronic Problems Addressed Today: Hypertension associated with diabetes ([place]) At goal on a irbesartan 75 mg daily.  Controlled type 2 diabetes mellitus without complication, without long-term current use of insulin (HCC) A1c 6.8.  She has been off the metformin for last several weeks.  We will continue Ozempic 1mg  weekly.  It is okay for her to stay off the metformin for now.  We will recheck again in 3 to 6 months.  If A1c is not controlled would increase dose of metformin before going back to metformin at this point.     Subjective:  HPI:  Patient here with cough, sore throat, headache, and nausea. Works in [employer] and has had a few sick contacts. No treatments tried. Stable the last few days. Some malaise.         Objective:  Physical Exam: BP 121/85   Pulse 72   Temp 98.5 F (36.9 C) (Temporal)   Ht 5\' 4"  (1.626 m)   Wt 206 lb 9.6 oz (93.7 kg)   LMP 02/06/2014 (LMP Unknown)   SpO2 98%   BMI 35.46 kg/m   Gen: No acute distress, resting comfortably HEENT: OP erythematous. CV: Regular rate and rhythm with no murmurs appreciated Pulm: Normal work of breathing, clear to auscultation bilaterally with no crackles, wheezes, or rhonchi Neuro: Grossly normal, moves all extremities Psych: Normal affect and thought content      Tais Koestner M. Jerline Pain, MD 02/07/2022 12:47 PM

## 2022-02-07 NOTE — Telephone Encounter (Signed)
Alternative Requested:OZEMPIC NOT ON INSURANCE FORMULARY. TRULICITY IS RECOMMENDED BY INSURANCE.

## 2022-02-07 NOTE — Assessment & Plan Note (Addendum)
At goal on a irbesartan 75 mg daily.

## 2022-02-07 NOTE — Patient Instructions (Signed)
It was very nice to see you today!  You have strep throat.  Please start the amoxicillin.  Let us know if not improving.  It is okay for you to stay off the metformin.  Please continue Ozempic.  Come back in 3 to 6 months to recheck your A1c.  Take care, Dr Jerline Pain  PLEASE NOTE:  If you had any lab tests please let us know if you have not heard back within a few days. You may see your results on mychart before we have a chance to review them but we will give you a call once they are reviewed by [place]. If we ordered any referrals today, please let us know if you have not heard from their office within the next week.   Please try these tips to maintain a healthy lifestyle:  Eat at least 3 REAL meals and 1-2 snacks per day.  Aim for no more than 5 hours between eating.  If you eat breakfast, please do so within one hour of getting up.   Each meal should contain half fruits/vegetables, one quarter protein, and one quarter carbs (no bigger than a computer mouse)  Cut down on sweet beverages. This includes juice, soda, and sweet tea.   Drink at least 1 glass of water with each meal and aim for at least 8 glasses per day  Exercise at least 150 minutes every week.

## 2022-02-07 NOTE — Assessment & Plan Note (Signed)
A1c 6.8.  She has been off the metformin for last several weeks.  We will continue Ozempic 1mg  weekly.  It is okay for her to stay off the metformin for now.  We will recheck again in 3 to 6 months.  If A1c is not controlled would increase dose of metformin before going back to metformin at this point.

## 2022-02-08 ENCOUNTER — Ambulatory Visit: Payer: 59 | Admitting: Family Medicine

## 2022-03-07 ENCOUNTER — Ambulatory Visit: Payer: Self-pay

## 2022-03-07 ENCOUNTER — Other Ambulatory Visit: Payer: Self-pay | Admitting: Family Medicine

## 2022-03-07 DIAGNOSIS — M25521 Pain in right elbow: Secondary | ICD-10-CM

## 2022-03-08 ENCOUNTER — Other Ambulatory Visit: Payer: Self-pay | Admitting: Family Medicine

## 2022-03-08 ENCOUNTER — Ambulatory Visit (INDEPENDENT_AMBULATORY_CARE_PROVIDER_SITE_OTHER): Payer: Commercial Managed Care - HMO | Admitting: Family Medicine

## 2022-03-08 ENCOUNTER — Encounter: Payer: Self-pay | Admitting: Family Medicine

## 2022-03-08 DIAGNOSIS — E1159 Type 2 diabetes mellitus with other circulatory complications: Secondary | ICD-10-CM | POA: Diagnosis not present

## 2022-03-08 DIAGNOSIS — I152 Hypertension secondary to endocrine disorders: Secondary | ICD-10-CM

## 2022-03-08 DIAGNOSIS — E119 Type 2 diabetes mellitus without complications: Secondary | ICD-10-CM | POA: Diagnosis not present

## 2022-03-08 MED ORDER — DOXYCYCLINE HYCLATE 100 MG PO TABS: 100.0000 mg | ORAL_TABLET | Freq: Two times a day (BID) | ORAL | 0 refills | Status: DC

## 2022-03-08 MED ORDER — DICLOFENAC SODIUM 75 MG PO TBEC: 75.0000 mg | DELAYED_RELEASE_TABLET | Freq: Two times a day (BID) | ORAL | 0 refills | Status: DC

## 2022-03-08 NOTE — Patient Instructions (Signed)
It was very nice to see you today!  Please start the antibiotic and diclofenac.  Let me know if not improving in the next 5 to 7 days.  Take care, Dr Jerline Pain  PLEASE NOTE:  If you had any lab tests please let us know if you have not heard back within a few days. You may see your results on mychart before we have a chance to review them but we will give you a call once they are reviewed by [place]. If we ordered any referrals today, please let us know if you have not heard from their office within the next week.   Please try these tips to maintain a healthy lifestyle:  Eat at least 3 REAL meals and 1-2 snacks per day.  Aim for no more than 5 hours between eating.  If you eat breakfast, please do so within one hour of getting up.   Each meal should contain half fruits/vegetables, one quarter protein, and one quarter carbs (no bigger than a computer mouse)  Cut down on sweet beverages. This includes juice, soda, and sweet tea.   Drink at least 1 glass of water with each meal and aim for at least 8 glasses per day  Exercise at least 150 minutes every week.

## 2022-03-08 NOTE — Assessment & Plan Note (Addendum)
Last A1c controlled 6.8.  This does slightly increase risk for infection.  She has not been on Ozempic due to needing chronic rotation.  Once she get this approved we can continue current regimen Ozempic 1 mg weekly.  She can stay off metformin.  She will come back in a few months and we can recheck A1c at that time.

## 2022-03-08 NOTE — Progress Notes (Addendum)
   Renee Knapp is a 46 y.o. female who presents today for an office visit.  Assessment/Plan:  New/Acute Problems: Left Axilla Pain Likely due to folliculitis.  No signs of overt abscess today no areas amenable to I&D.  No signs of systemic illness today.  We will start doxycycline 100 mg twice daily for 7 days.  She will let us know if not improving and we can get ultrasound at that time to rule out lymphadenopathy and to assess if there are any areas amenable to I&D  Right Arm Pain May be consistent with muscular strain though she does have a fair amount of erythema and edema as well.  We will be treating with doxycycline as above to cover for any possible underlying cellulitis but also start diclofenac 75 mg twice daily.  She will let us know if not improving and we can refer to sports medicine.  Chronic Problems Addressed Today: Hypertension associated with diabetes ([place]) Blood pressure at goal off medications.   Controlled type 2 diabetes mellitus without complication, without long-term current use of insulin (HCC) Last A1c controlled 6.8.  This does slightly increase risk for infection.  She has not been on Ozempic due to needing chronic rotation.  Once she get this approved we can continue current regimen Ozempic 1 mg weekly.  She can stay off metformin.  She will come back in a few months and we can recheck A1c at that time.     Subjective:  HPI:  Patient here with pain in the left axilla.  Started a few days ago.  She thinks she has had some pus drained from the area as well.  She has symptoms similar several years ago.  Ended up having a biopsy which she told was told was negative.  No fevers or chills.  No specific treatments tried.  She has a had right arm pain for the last several days.  This occurred as a result of a work injury.  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  Noticed immediate pain to the right lateral aspect of her right forearm.  She has had  some pain and swelling since then.  She followed up with the Worker's Comp. physician who told her to take a Tylenol and ibuprofen.  Pain is persisted.       Objective:  Physical Exam: BP 118/76   Pulse 86   Temp 97.7 F (36.5 C) (Temporal)   Ht 5\' 4"  (1.626 m)   Wt 207 lb (93.9 kg)   LMP 02/06/2014 (LMP Unknown)   SpO2 100%   BMI 35.53 kg/m   Gen: No acute distress, resting comfortably MSK: - Right Axilla: several inflamed hair follicles noted. 2 discrete tender nodules noted in left axilla.  No surrounding erythema. No purulent drainage.   - Right Arm: Right forearm tender to palpation along dorsal aspect.  Erythematous.  Warm to touch.  Neurovascular intact distally. Neuro: Grossly normal, moves all extremities Psych: Normal affect and thought content      Nicolae Vasek M. Jerline Pain, MD 03/08/2022 2:47 PM

## 2022-03-08 NOTE — Assessment & Plan Note (Signed)
Blood pressure at goal off medications.  

## 2022-03-10 NOTE — Telephone Encounter (Signed)
She will need to check with insurance to see what is covereed

## 2022-03-10 NOTE — Telephone Encounter (Signed)
Any alternative medications?

## 2022-03-12 ENCOUNTER — Encounter: Payer: Self-pay | Admitting: Family Medicine

## 2022-03-14 ENCOUNTER — Encounter: Payer: Self-pay | Admitting: Physician Assistant

## 2022-03-14 ENCOUNTER — Ambulatory Visit (INDEPENDENT_AMBULATORY_CARE_PROVIDER_SITE_OTHER): Payer: Commercial Managed Care - HMO | Admitting: Physician Assistant

## 2022-03-14 VITALS — BP 130/86 | HR 97 | Temp 98.4°F | Ht 64.0 in | Wt 208.5 lb

## 2022-03-14 DIAGNOSIS — M79622 Pain in left upper arm: Secondary | ICD-10-CM

## 2022-03-14 DIAGNOSIS — M79601 Pain in right arm: Secondary | ICD-10-CM | POA: Diagnosis not present

## 2022-03-14 MED ORDER — CEPHALEXIN 500 MG PO CAPS: 500.0000 mg | ORAL_CAPSULE | Freq: Four times a day (QID) | ORAL | 0 refills | Status: DC

## 2022-03-14 NOTE — Progress Notes (Signed)
I, Renee Knapp, LAT, ATC acting as a scribe for Renee Graham, MD.  Subjective:    CC: R arm pain  HPI: Pt is a 46 y/o R arm pain ongoing since Oct 4th. Pt is R-hand dominate. 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 Pt locates pain to the lateral aspect of the R elbow and all over the forearm.  Grip strength: decrease- difficulty opening a bottle, driving Paresthesias: yes- along the palm Aggravates: gripping, pronation/supination Treatments tried: Oral diclofenac, Tylenol, naproxen, IBU, heat, ice, antibiotic  Dx imaging: 03/07/22 R elbow XR  12/09/21 R elbow XR  Pertinent review of Systems: No fevers or chills  Relevant historical information: Hypertension and diabetes   Objective:    Vitals:   03/15/22 0940  BP: 138/86  Pulse: 85  SpO2: 97%   General: Well Developed, well nourished, and in no acute distress.   MSK: Right elbow: Normal-appearing Range of motion decreased extension lacking 5 degrees of full extension.  Lacking full supination by about 5 degrees as well.  Otherwise range of motion is intact. Strength is intact. Tender palpation at lateral epicondyle.  Pain is present with resisted wrist extension and grip strength.  Right wrist normal appearing Positive Tinel's and Phalen's test.  Lab and Radiology Results  Procedure: Real-time Ultrasound Guided Injection of right lateral epicondyle Device: Philips Affiniti 50G Images permanently stored and available for review in PACS Verbal informed consent obtained.  Discussed risks and benefits of procedure. Warned about infection, bleeding, hyperglycemia damage to structures among others. Patient expresses understanding and agreement Time-out conducted.   Noted no overlying erythema, induration, or other signs of local infection.   Skin prepped in a sterile fashion.   Local anesthesia: Topical Ethyl chloride.   With sterile technique and  under real time ultrasound guidance: 40 mg of Kenalog and 2 mL of Marcaine injected into the common extensor tendon origin at lateral epicondyles. Fluid seen entering the common extensor tendon origin.   Completed without difficulty   Pain immediately resolved suggesting accurate placement of the medication.   Advised to call if fevers/chills, erythema, induration, drainage, or persistent bleeding.   Images permanently stored and available for review in the ultrasound unit.  Impression: Technically successful ultrasound guided injection.    Quick look right wrist carpal tunnel shows enlarged median nerve measuring 19 mm cross-sectional area consistent with mild to moderate carpal tunnel syndrome.    Impression and Recommendations:    Assessment and Plan: 46 y.o. female with right elbow pain thought to be lateral epicondylitis.  Her lack of range of motion is not entirely consistent with lateral epicondylitis.  Recent x-ray does not show severe degenerative changes or other explanation.  Plan for trial of injection today, home exercise program and recheck in 6 weeks. This is a chronic problem with acute exacerbation and recurrence.  She does have some distal paresthesias that are more consistent with carpal tunnel syndrome.  Plan for night splint.  Recheck 6 weeks.  PDMP not reviewed this encounter. Orders Placed This Encounter  Procedures   [place] LIMITED JOINT SPACE STRUCTURES UP RIGHT(NO LINKED CHARGES)    Order Specific Question:   Reason for Exam (SYMPTOM  OR DIAGNOSIS REQUIRED)    Answer:   Right forearm pain    Order Specific Question:   Preferred imaging location?    Answer:   Adult nurse [facility]   No  orders of the defined types were placed in this encounter.   Discussed warning signs or symptoms. Please see discharge instructions. Patient expresses understanding.   The above documentation has been reviewed and is accurate and complete [name], M.D.

## 2022-03-14 NOTE — Patient Instructions (Signed)
It was great to see you!  Stop doxycycline Start keflex antibiotic  If you are still unable to tolerate this antibiotic, let us know.  Please see Dr. Debroah Loop at 930:  Their location:  [place] [facility]  [address] on the 1st floor Phone number [phone] Fax [phone].   This location is across the street from the entrance to [employer] and in the same complex as the [facility]  Take care,  Inda Coke PA-C

## 2022-03-14 NOTE — Progress Notes (Signed)
Renee Knapp is a 46 y.o. female here for a follow up for pre-existing problem.  History of Present Illness:   Chief Complaint  Patient presents with   Cyst    Pt c/o left axilla discomfort, has a knot, last drained on Saturday. Pt was on Doxy, but stopped on Saturday due to nausea.    HPI  Axillary pain Patient is complaining of a cyst in left axilla. She saw PCP on 03/08/22 for this and was prescribed doxycycline. She is taking this as prescribed but is now having significant nausea and diarrhea. She is not sure if this is related to the medication or related to the diclofenac that she has been taking. She has taken with food regularly and this has not changed her symptoms.  Two days ago, she did note some pus coming out of her axilla. She states that she uses a heat compress to manage symptoms with no relief.     R arm pain Reports that she had a child pull on her arm at work on 02/23/22 while at work. Had immediate burning sensation. Had xray on 03/07/22 was normal. She has ongoing pain and is unable to return to work due this pain. Patient complains of pain when performing simple tasks such as driving. She is also complaining of pain and inflammation on right forearm. She has taken diclofenac without relief.     Past Medical History:  Diagnosis Date   Anemia    History   Diabetes mellitus without complication (HCC)    diet and exercise controlled - no meds   Hypertension    Obesity    Plantar fasciitis, bilateral    History - recvd cortisone injections bilateral- no current prob as 01/2014   SVD (spontaneous vaginal delivery)    x 2     Social History   Tobacco Use   Smoking status: Never   Smokeless tobacco: Never  Vaping Use   Vaping Use: Never used  Substance Use Topics   Alcohol use: No   Drug use: No    Past Surgical History:  Procedure Laterality Date   CYSTOSCOPY N/A 02/24/2014   Procedure: CYSTOSCOPY;  Surgeon: Essie Hart, MD;  Location: [facility];   Service: Gynecology;  Laterality: N/A;   DILATION AND CURETTAGE OF UTERUS     MAB   LAPAROSCOPIC ASSISTED VAGINAL HYSTERECTOMY N/A 02/24/2014   Procedure: LAPAROSCOPIC ASSISTED VAGINAL HYSTERECTOMY WITH BILATERAL SALPINGECTOMY ;  Surgeon: Essie Hart, MD;  Location: [facility];  Service: Gynecology;  Laterality: N/A;   TUBAL LIGATION     WISDOM TOOTH EXTRACTION      Family History  Problem Relation Age of Onset   Diabetes Mother    Heart disease Mother    Hypertension Mother    Hypertension Father    High Cholesterol Father    Hypertension Sister    Kidney disease Sister    Stroke Sister    Hypertension Sister    Breast cancer Maternal Aunt    Hearing loss Son    Cancer Other    Hypertension Other    Diabetes Other     Allergies  Allergen Reactions   Bee Venom Anaphylaxis   Reglan [Metoclopramide] Nausea And Vomiting and Anxiety    Current Medications:   Current Outpatient Medications:    Bayer Microlet Lancets lancets, USE 1 SINGLE USE LANCETS AS NEEDED TO CHECK BLOOD SUGAR, Disp: 100 each, Rfl: 1   cephALEXin (KEFLEX) 500 MG capsule, Take 1 capsule (500 mg total)  by mouth 4 (four) times daily., Disp: 20 capsule, Rfl: 0   CONTOUR NEXT TEST test strip, USE 1 LANCETS AS NEEDED TO CHECK BLOOD SUGAR, Disp: 100 strip, Rfl: 1   EPINEPHrine (EPIPEN 2-PAK) 0.3 mg/0.3 mL IJ SOAJ injection, Inject 0.3 mg into the muscle as needed for anaphylaxis., Disp: 2 each, Rfl: 1   escitalopram (LEXAPRO) 5 MG tablet, TAKE 1 TABLET (5 MG TOTAL) BY MOUTH DAILY., Disp: 90 tablet, Rfl: 2   hydrOXYzine (ATARAX) 25 MG tablet, Take 1 tablet (25 mg total) by mouth every 6 (six) hours., Disp: 12 tablet, Rfl: 0   ibuprofen (ADVIL) 800 MG tablet, Take 1 tablet (800 mg total) by mouth every 8 (eight) hours as needed., Disp: 30 tablet, Rfl: 1   naproxen (NAPROSYN) 500 MG tablet, Take 1 tablet (500 mg total) by mouth 2 (two) times daily., Disp: 20 tablet, Rfl: 0   rosuvastatin (CRESTOR) 10 MG tablet, Take 1  tablet (10 mg total) by mouth daily., Disp: 90 tablet, Rfl: 3   tiZANidine (ZANAFLEX) 4 MG tablet, Take 4 mg by mouth at bedtime as needed., Disp: , Rfl:    diclofenac (VOLTAREN) 75 MG EC tablet, Take 1 tablet (75 mg total) by mouth 2 (two) times daily. (Patient not taking: Reported on 03/14/2022), Disp: 30 tablet, Rfl: 0   doxycycline (VIBRA-TABS) 100 MG tablet, Take 1 tablet (100 mg total) by mouth 2 (two) times daily. (Patient not taking: Reported on 03/14/2022), Disp: 14 tablet, Rfl: 0   Semaglutide, 1 MG/DOSE, (OZEMPIC, 1 MG/DOSE,) 4 MG/3ML SOPN, INJECT 1 MG INTO THE SKIN ONE TIME PER WEEK (Patient not taking: Reported on 03/14/2022), Disp: 3 mL, Rfl: 1   Review of Systems:   Review of Systems  Constitutional:  Negative for chills and fever.  Gastrointestinal:  Positive for diarrhea and nausea.  Skin:        (+) painful cyst under left arm (+) inflamed right forearm  Neurological:  Positive for headaches.    Vitals:   Vitals:   03/14/22 1344  BP: 130/86  Pulse: 97  Temp: 98.4 F (36.9 C)  TempSrc: Temporal  SpO2: 95%  Weight: 208 lb 8 oz (94.6 kg)  Height: 5\' 4"  (1.626 m)     Body mass index is 35.79 kg/m.  Physical Exam:   Physical Exam Constitutional:      General: She is not in acute distress.    Appearance: Normal appearance. She is not ill-appearing.  HENT:     Head: Normocephalic and atraumatic.     Right Ear: External ear normal.     Left Ear: External ear normal.  Eyes:     Extraocular Movements: Extraocular movements intact.     Pupils: Pupils are equal, round, and reactive to light.  Cardiovascular:     Rate and Rhythm: Normal rate and regular rhythm.     Heart sounds: Normal heart sounds. No murmur heard.    No gallop.  Pulmonary:     Effort: Pulmonary effort is normal. No respiratory distress.     Breath sounds: Normal breath sounds. No wheezing or rales.  Musculoskeletal:     Comments: Significant erythema and mild swelling to R forearm -- TTP   Skin:    General: Skin is warm and dry.     Comments: Left axilla with small area of tender induration -- hyperpigmented No fluctuance  Neurological:     Mental Status: She is alert and oriented to person, place, and time.  Psychiatric:  Judgment: Judgment normal.     Assessment and Plan:   Left axillary pain Unable to tolerate doxy -- stop No systemic signs of illness and vitals are stable Recommend keflex 500 mg QID x 5 days If persists, consider imaging  Right arm pain Referral to sports medicine for further evaluation given lack of improvement  I,Verona Buck,acting as a scribe for [employer], PA.,have documented all relevant documentation on the behalf of Jarold Motto, PA,as directed by  Jarold Motto, PA while in the presence of Jarold Motto, [place].  I, Jarold Motto, [place], have reviewed all documentation for this visit. The documentation on 03/14/22 for the exam, diagnosis, procedures, and orders are all accurate and complete. =   Jarold Motto, PA-C

## 2022-03-15 ENCOUNTER — Ambulatory Visit: Payer: Self-pay

## 2022-03-15 ENCOUNTER — Ambulatory Visit (INDEPENDENT_AMBULATORY_CARE_PROVIDER_SITE_OTHER): Payer: Commercial Managed Care - HMO | Admitting: Family Medicine

## 2022-03-15 VITALS — BP 138/86 | HR 85 | Ht 64.0 in | Wt 212.0 lb

## 2022-03-15 DIAGNOSIS — M79631 Pain in right forearm: Secondary | ICD-10-CM

## 2022-03-15 DIAGNOSIS — M7711 Lateral epicondylitis, right elbow: Secondary | ICD-10-CM

## 2022-03-15 DIAGNOSIS — G5601 Carpal tunnel syndrome, right upper limb: Secondary | ICD-10-CM | POA: Diagnosis not present

## 2022-03-15 NOTE — Patient Instructions (Signed)
Thank you for coming in today.   You received an injection today. Seek immediate medical attention if the joint becomes red, extremely painful, or is oozing fluid.   Wear a carpal tunnel wrist brace  Check back in 6 weeks

## 2022-04-11 ENCOUNTER — Ambulatory Visit: Payer: 59 | Admitting: Family Medicine

## 2022-04-13 ENCOUNTER — Ambulatory Visit (HOSPITAL_COMMUNITY)
Admission: EM | Admit: 2022-04-13 | Discharge: 2022-04-13 | Disposition: A | Discharge status: Home / Self Care | Payer: Commercial Managed Care - HMO | Attending: Nurse Practitioner | Admitting: Nurse Practitioner

## 2022-04-13 ENCOUNTER — Encounter (HOSPITAL_COMMUNITY): Payer: Self-pay | Admitting: Emergency Medicine

## 2022-04-13 DIAGNOSIS — J40 Bronchitis, not specified as acute or chronic: Secondary | ICD-10-CM | POA: Diagnosis not present

## 2022-04-13 MED ORDER — AZITHROMYCIN 250 MG PO TABS: ORAL_TABLET | ORAL | 0 refills | Status: DC

## 2022-04-13 NOTE — ED Triage Notes (Signed)
Pt reports a productive cough, headache and bilateral ear pain x 1 week. Reports she began having body aches this afternoon. Has been taking Tylenol for pain.

## 2022-04-13 NOTE — ED Provider Notes (Signed)
[facility]    CSN: [id number] Arrival date & time: 04/13/22  1831      History   Chief Complaint Chief Complaint  Patient presents with   Cough   Headache   Generalized Body Aches    HPI Renee Knapp is a 46 y.o. female presents for evaluation of cough, congestion, and ear pain.  Patient reports 1 week of a productive cough with purulent phlegm, nasal congestion, and bilateral ear pain.  She denies any fevers, sore throat, nausea/vomiting, shortness of breath.  Did develop body aches today.  No history of asthma or smoking.  She is vaccinated for COVID and flu.  No recent travel or known sick contacts.  She has been taking Tylenol for symptoms.  Denies any other concerns at this time.   Cough Associated symptoms: ear pain and myalgias   Headache Associated symptoms: congestion, cough, ear pain and myalgias     Past Medical History:  Diagnosis Date   Anemia    History   Diabetes mellitus without complication (HCC)    diet and exercise controlled - no meds   Hypertension    Obesity    Plantar fasciitis, bilateral    History - recvd cortisone injections bilateral- no current prob as 01/2014   SVD (spontaneous vaginal delivery)    x 2    Patient Active Problem List   Diagnosis Date Noted   Lateral epicondylitis, right elbow 03/15/2022   Probable carpal tunnel syndrome, right 03/15/2022   Hypertension associated with diabetes (HCC) 03/10/2021   GAD (generalized anxiety disorder) 08/30/2018   Vitamin D deficiency 01/18/2018   Dyslipidemia associated with type 2 diabetes mellitus (HCC) 11/25/2017   Controlled type 2 diabetes mellitus without complication, without long-term current use of insulin (HCC) 11/10/2017   S/P laparoscopic assisted vaginal hysterectomy (LAVH) 02/24/2014    Past Surgical History:  Procedure Laterality Date   CYSTOSCOPY N/A 02/24/2014   Procedure: CYSTOSCOPY;  Surgeon: Essie Hart, MD;  Location: [facility];  Service: Gynecology;   Laterality: N/A;   DILATION AND CURETTAGE OF UTERUS     MAB   LAPAROSCOPIC ASSISTED VAGINAL HYSTERECTOMY N/A 02/24/2014   Procedure: LAPAROSCOPIC ASSISTED VAGINAL HYSTERECTOMY WITH BILATERAL SALPINGECTOMY ;  Surgeon: Essie Hart, MD;  Location: [facility];  Service: Gynecology;  Laterality: N/A;   TUBAL LIGATION     WISDOM TOOTH EXTRACTION      OB History     Gravida  3   Para      Term      Preterm      AB  1   Living  2      SAB  1   IAB      Ectopic      Multiple      Live Births               Home Medications    Prior to Admission medications   Medication Sig Start Date End Date Taking? Authorizing Provider  azithromycin (ZITHROMAX Z-PAK) 250 MG tablet Take 2 tablets by mouth on day 1 then 1 tablet by mouth daily for 4 days. 04/13/22  Yes Revonda Standard, NP  Bayer Microlet Lancets lancets USE 1 SINGLE USE LANCETS AS NEEDED TO CHECK BLOOD SUGAR 04/24/19   [name], MD  cephALEXin (KEFLEX) 500 MG capsule Take 1 capsule (500 mg total) by mouth 4 (four) times daily. 03/14/22   Jarold Motto, PA  CONTOUR NEXT TEST test strip USE 1 LANCETS AS NEEDED  TO CHECK BLOOD SUGAR 03/30/20   [name], MD  EPINEPHrine (EPIPEN 2-PAK) 0.3 mg/0.3 mL IJ SOAJ injection Inject 0.3 mg into the muscle as needed for anaphylaxis. 10/08/20   [name], MD  escitalopram (LEXAPRO) 5 MG tablet TAKE 1 TABLET (5 MG TOTAL) BY MOUTH DAILY. 11/02/21   Ardith Dark, MD  hydrOXYzine (ATARAX) 25 MG tablet Take 1 tablet (25 mg total) by mouth every 6 (six) hours. 10/09/21   Blue, Soijett A, PA-C  ibuprofen (ADVIL) 800 MG tablet Take 1 tablet (800 mg total) by mouth every 8 (eight) hours as needed. 09/21/21   Romualdo Bolk, MD  naproxen (NAPROSYN) 500 MG tablet Take 1 tablet (500 mg total) by mouth 2 (two) times daily. 09/17/21   Gwyneth Sprout, MD  rosuvastatin (CRESTOR) 10 MG tablet Take 1 tablet (10 mg total) by mouth daily. 10/12/20   [name], MD  Semaglutide, 1 MG/DOSE,  (OZEMPIC, 1 MG/DOSE,) 4 MG/3ML SOPN INJECT 1 MG INTO THE SKIN ONE TIME PER WEEK 02/07/22   Ardith Dark, MD  tiZANidine (ZANAFLEX) 4 MG tablet Take 4 mg by mouth at bedtime as needed. 01/28/22   [provider]    Family History Family History  Problem Relation Age of Onset   Diabetes Mother    Heart disease Mother    Hypertension Mother    Hypertension Father    High Cholesterol Father    Hypertension Sister    Kidney disease Sister    Stroke Sister    Hypertension Sister    Breast cancer Maternal Aunt    Hearing loss Son    Cancer Other    Hypertension Other    Diabetes Other     Social History Social History   Tobacco Use   Smoking status: Never   Smokeless tobacco: Never  Vaping Use   Vaping Use: Never used  Substance Use Topics   Alcohol use: No   Drug use: No     Allergies   Bee venom and Reglan [metoclopramide]   Review of Systems Review of Systems  HENT:  Positive for congestion and ear pain.   Respiratory:  Positive for cough.   Musculoskeletal:  Positive for myalgias.     Physical Exam Triage Vital Signs ED Triage Vitals  Enc Vitals Group     BP 04/13/22 1847 124/87     Pulse Rate 04/13/22 1847 74     Resp 04/13/22 1847 18     Temp 04/13/22 1847 98.1 F (36.7 C)     Temp Source 04/13/22 1847 Oral     SpO2 04/13/22 1847 96 %     Weight --      Height --      Head Circumference --      Peak Flow --      Pain Score 04/13/22 1846 8     Pain Loc --      Pain Edu? --      Excl. in GC? --    No data found.  Updated Vital Signs BP 124/87 (BP Location: Left Arm)   Pulse 74   Temp 98.1 F (36.7 C) (Oral)   Resp 18   LMP 02/06/2014 (LMP Unknown)   SpO2 96%   Visual Acuity Right Eye Distance:   Left Eye Distance:   Bilateral Distance:    Right Eye Near:   Left Eye Near:    Bilateral Near:     Physical Exam Vitals and nursing note reviewed.  Constitutional:  General: She is not in acute distress.    Appearance: She  is well-developed. She is not ill-appearing.  HENT:     Head: Normocephalic and atraumatic.     Mouth/Throat:     Mouth: Mucous membranes are moist.     Pharynx: Oropharynx is clear. Uvula midline. No posterior oropharyngeal erythema.     Tonsils: No tonsillar exudate or tonsillar abscesses.  Eyes:     Extraocular Movements: Extraocular movements intact.     Pupils: Pupils are equal, round, and reactive to light.  Cardiovascular:     Rate and Rhythm: Normal rate and regular rhythm.     Heart sounds: Normal heart sounds.  Pulmonary:     Effort: Pulmonary effort is normal.     Breath sounds: Normal breath sounds.  Skin:    General: Skin is warm and dry.  Neurological:     General: No focal deficit present.     Mental Status: She is alert and oriented to person, place, and time.  Psychiatric:        Mood and Affect: Mood normal.        Behavior: Behavior normal.      UC Treatments / Results  Labs (all labs ordered are listed, but only abnormal results are displayed) Labs Reviewed - No data to display  EKG   Radiology No results found.  Procedures Procedures (including critical care time)  Medications Ordered in UC Medications - No data to display  Initial Impression / Assessment and Plan / UC Course  I have reviewed the triage vital signs and the nursing notes.  Pertinent labs & imaging results that were available during my care of the patient were reviewed by me and considered in my medical decision making (see chart for details).     Reviewed exam and symptoms with patient Start Zithromax Patient declined Tessalon will use over-the-counter cough medicine as needed Rest and fluids Follow-up with PCP 2 to 3 days for recheck Strict ER precautions reviewed and patient verbalized understanding Final Clinical Impressions(s) / UC Diagnoses   Final diagnoses:  Bronchitis     Discharge Instructions      Follow-up with your PCP 2 to 3 days for recheck Please  go to the emergency room for any worsening symptoms I hope you feel better soon exhalation point    ED Prescriptions     Medication Sig Dispense Auth. Provider   azithromycin (ZITHROMAX Z-PAK) 250 MG tablet Take 2 tablets by mouth on day 1 then 1 tablet by mouth daily for 4 days. 6 each Revonda Standard, NP      PDMP not reviewed this encounter.   Revonda Standard, NP 04/13/22 1905

## 2022-04-13 NOTE — Discharge Instructions (Signed)
Follow-up with your PCP 2 to 3 days for recheck Please go to the emergency room for any worsening symptoms I hope you feel better soon exhalation point

## 2022-04-20 ENCOUNTER — Ambulatory Visit (HOSPITAL_COMMUNITY)
Admission: EM | Admit: 2022-04-20 | Discharge: 2022-04-20 | Disposition: A | Discharge status: Home / Self Care | Payer: Commercial Managed Care - HMO | Attending: Family Medicine | Admitting: Family Medicine

## 2022-04-20 ENCOUNTER — Ambulatory Visit (INDEPENDENT_AMBULATORY_CARE_PROVIDER_SITE_OTHER): Payer: Commercial Managed Care - HMO

## 2022-04-20 ENCOUNTER — Encounter (HOSPITAL_COMMUNITY): Payer: Self-pay | Admitting: Emergency Medicine

## 2022-04-20 DIAGNOSIS — R059 Cough, unspecified: Secondary | ICD-10-CM | POA: Diagnosis not present

## 2022-04-20 DIAGNOSIS — R051 Acute cough: Secondary | ICD-10-CM

## 2022-04-20 MED ORDER — BENZONATATE 100 MG PO CAPS: ORAL_CAPSULE | ORAL | 0 refills | Status: DC

## 2022-04-20 NOTE — ED Triage Notes (Signed)
Pt reports still having cough even after taking z-pack. Reports neck pain and right ear pain. Pt works at a daycare.

## 2022-04-21 ENCOUNTER — Emergency Department (HOSPITAL_COMMUNITY): Payer: Commercial Managed Care - HMO

## 2022-04-21 ENCOUNTER — Other Ambulatory Visit: Payer: Self-pay

## 2022-04-21 ENCOUNTER — Emergency Department (HOSPITAL_COMMUNITY)
Admission: EM | Admit: 2022-04-21 | Discharge: 2022-04-21 | Disposition: A | Discharge status: Home / Self Care | Payer: Commercial Managed Care - HMO | Attending: Emergency Medicine | Admitting: Emergency Medicine

## 2022-04-21 DIAGNOSIS — Z20822 Contact with and (suspected) exposure to covid-19: Secondary | ICD-10-CM | POA: Insufficient documentation

## 2022-04-21 DIAGNOSIS — I10 Essential (primary) hypertension: Secondary | ICD-10-CM | POA: Diagnosis not present

## 2022-04-21 DIAGNOSIS — Z794 Long term (current) use of insulin: Secondary | ICD-10-CM | POA: Diagnosis not present

## 2022-04-21 DIAGNOSIS — E119 Type 2 diabetes mellitus without complications: Secondary | ICD-10-CM | POA: Insufficient documentation

## 2022-04-21 DIAGNOSIS — J189 Pneumonia, unspecified organism: Secondary | ICD-10-CM

## 2022-04-21 DIAGNOSIS — R079 Chest pain, unspecified: Secondary | ICD-10-CM | POA: Diagnosis present

## 2022-04-21 DIAGNOSIS — J168 Pneumonia due to other specified infectious organisms: Secondary | ICD-10-CM | POA: Diagnosis not present

## 2022-04-21 LAB — CBC WITH DIFFERENTIAL/PLATELET
Abs Immature Granulocytes: 0.03 10*3/uL (ref 0.00–0.07)
Basophils Absolute: 0 10*3/uL (ref 0.0–0.1)
Basophils Relative: 0 %
Eosinophils Absolute: 0.3 10*3/uL (ref 0.0–0.5)
Eosinophils Relative: 3 %
HCT: 42.2 % (ref 36.0–46.0)
Hemoglobin: 13.5 g/dL (ref 12.0–15.0)
Immature Granulocytes: 0 %
Lymphocytes Relative: 47 %
Lymphs Abs: 5 10*3/uL — ABNORMAL HIGH (ref 0.7–4.0)
MCH: 27 pg (ref 26.0–34.0)
MCHC: 32 g/dL (ref 30.0–36.0)
MCV: 84.4 fL (ref 80.0–100.0)
Monocytes Absolute: 0.6 10*3/uL (ref 0.1–1.0)
Monocytes Relative: 6 %
Neutro Abs: 4.6 10*3/uL (ref 1.7–7.7)
Neutrophils Relative %: 44 %
Platelets: 349 10*3/uL (ref 150–400)
RBC: 5 MIL/uL (ref 3.87–5.11)
RDW: 13.2 % (ref 11.5–15.5)
WBC: 10.5 10*3/uL (ref 4.0–10.5)
nRBC: 0 % (ref 0.0–0.2)

## 2022-04-21 LAB — BASIC METABOLIC PANEL
Anion gap: 14 (ref 5–15)
BUN: 12 mg/dL (ref 6–20)
CO2: 24 mmol/L (ref 22–32)
Calcium: 9.9 mg/dL (ref 8.9–10.3)
Chloride: 102 mmol/L (ref 98–111)
Creatinine, Ser: 0.94 mg/dL (ref 0.44–1.00)
GFR, Estimated: >60 mL/min (ref >60)
Glucose, Bld: 168 mg/dL — ABNORMAL HIGH (ref 70–99)
Potassium: 3.7 mmol/L (ref 3.5–5.1)
Sodium: 140 mmol/L (ref 135–145)

## 2022-04-21 LAB — TROPONIN I (HIGH SENSITIVITY)
Troponin I (High Sensitivity): 4 ng/L (ref <18)
Troponin I (High Sensitivity): 7 ng/L (ref <18)

## 2022-04-21 LAB — RESP PANEL BY RT-PCR (FLU A&B, COVID) ARPGX2
Influenza A by PCR: NEGATIVE
Influenza B by PCR: NEGATIVE
SARS Coronavirus 2 by RT PCR: NEGATIVE

## 2022-04-21 LAB — D-DIMER, QUANTITATIVE: D-Dimer, Quant: 0.35 ug/mL-FEU (ref 0.00–0.50)

## 2022-04-21 MED ORDER — ACETAMINOPHEN 325 MG PO TABS
650.0000 mg | ORAL_TABLET | Freq: Once | ORAL | Status: AC
  Administered 2022-04-21: 650 mg via ORAL
  Filled 2022-04-21: qty 2

## 2022-04-21 MED ORDER — LEVOFLOXACIN 500 MG PO TABS
500.0000 mg | ORAL_TABLET | Freq: Once | ORAL | Status: AC
  Administered 2022-04-21: 500 mg via ORAL
  Filled 2022-04-21: qty 1

## 2022-04-21 MED ORDER — IPRATROPIUM-ALBUTEROL 0.5-2.5 (3) MG/3ML IN SOLN
3.0000 mL | Freq: Once | RESPIRATORY_TRACT | Status: AC
  Administered 2022-04-21: 3 mL via RESPIRATORY_TRACT
  Filled 2022-04-21: qty 3

## 2022-04-21 MED ORDER — BENZONATATE 100 MG PO CAPS: 100.0000 mg | ORAL_CAPSULE | Freq: Three times a day (TID) | ORAL | 0 refills | Status: DC

## 2022-04-21 MED ORDER — PREDNISONE 20 MG PO TABS: 20.0000 mg | ORAL_TABLET | Freq: Every day | ORAL | 0 refills | Status: AC

## 2022-04-21 MED ORDER — METHYLPREDNISOLONE SODIUM SUCC 125 MG IJ SOLR
125.0000 mg | Freq: Once | INTRAMUSCULAR | Status: AC
  Administered 2022-04-21: 125 mg via INTRAVENOUS
  Filled 2022-04-21: qty 2

## 2022-04-21 MED ORDER — LEVOFLOXACIN 500 MG PO TABS: 500.0000 mg | ORAL_TABLET | Freq: Every day | ORAL | 0 refills | Status: DC

## 2022-04-21 MED ORDER — ALBUTEROL SULFATE HFA 108 (90 BASE) MCG/ACT IN AERS: 1.0000 | INHALATION_SPRAY | Freq: Four times a day (QID) | RESPIRATORY_TRACT | 0 refills | Status: DC | PRN

## 2022-04-21 NOTE — Discharge Instructions (Signed)
Your X-ray showed a pneumonia.  I have switched your antibiotics.  Make sure to take a probiotic with this as well.  Follow up with Primary care provider early next week for re-evaluation  Return for new or worsening symptoms

## 2022-04-21 NOTE — ED Notes (Signed)
Given a Duo neb by EMS

## 2022-04-21 NOTE — ED Triage Notes (Addendum)
Pt. Stated, Renee Knapp been seen twice for bronchitis and took a Z-pak and was seen yesterday for bronchitis and was given a Rx for cough but didn't get it filled yet. Im having chest pain with my cough 20 g rt. hand

## 2022-04-21 NOTE — ED Notes (Signed)
Discharge instructions reviewed with pt. Pt verbalizes understanding. Belongings with pt upon depart. Ambulatory to POV.

## 2022-04-21 NOTE — ED Provider Triage Note (Signed)
Emergency Medicine Provider Triage Evaluation Note  Renee Knapp , a 46 y.o. female  was evaluated in triage.  Pt complains of shortness of breath and chest tightness.  She reports that for about a week she has been having a cough and congestion, was seen at urgent care on 11/22 and diagnosed with bronchitis and prescribed a Z-Pak.  Despite completing antibiotics her symptoms did not improve.  She went back to urgent care yesterday and they told her that they still thought that she had bronchitis she reports that she thinks they sent her in a prescription for cough medication is unsure if they prescribed other medicines but by the time she left the pharmacy was already closed and she had not been able to fill these prescriptions.  While at work today she started coughing and could not stop and then started feeling a lot of chest pressure and like she could not catch her breath.  EMS called to her place of employment, EMS noted that she was tight with decreased air movement but without wheezing, gave DuoNeb with some improvement.  Review of Systems  Positive: Cough, congestion, chest tightness, shortness of breath Negative: , Vomiting, diarrhea, abdominal pain, syncope  Physical Exam  BP 133/82 (BP Location: Left Arm)   Pulse (!) 106   Temp 98.1 F (36.7 C) (Oral)   Resp 18   Ht 5\' 4"  (1.626 m)   Wt 93 kg   LMP 02/06/2014 (LMP Unknown)   SpO2 100%   BMI 35.19 kg/m  Gen:   Awake, no distress   Resp:  Normal effort, lungs clear but with decreased air movement, no wheezing or rhonchi noted. MSK:   Moves extremities without difficulty  Other:    Medical Decision Making  Medically screening exam initiated at 11:27 AM.  Appropriate orders placed.  ESME DURKIN was informed that the remainder of the evaluation will be completed by another provider, this initial triage assessment does not replace that evaluation, and the importance of remaining in the ED until their evaluation is  complete.  Labs, chest x-ray and EKG ordered.  EKG without evidence of STEMI.  Will give additional DuoNeb and steroids in triage.   [name], Dartha Lodge 04/21/22 1142

## 2022-04-21 NOTE — ED Provider Notes (Signed)
[facility] EMERGENCY DEPARTMENT Provider Note   CSN: [id number] Arrival date & time: 04/21/22  1122     History  Chief Complaint  Patient presents with   Chest Pain    Renee Knapp is a 46 y.o. female with past medical history significant for diabetes, hypertension, GAD here for evaluation of chest pain and cough.  Seen in urgent care diagnosed with bronchitis.  Was given azithromycin.  And again yesterday given prescription for cough however did not get it filled yet.  Patient states today she developed some right-sided chest pain, diffuse chest tightness, shortness of breath and cough.  States she had a few episodes of blood-tinged emesis.  No pain or swelling to her lower extremities.  No history of PE or DVT.  No fever, back pain.  No recent surgery, immobilization or malignancy  HPI     Home Medications Prior to Admission medications   Medication Sig Start Date End Date Taking? Authorizing Provider  albuterol (VENTOLIN HFA) 108 (90 Base) MCG/ACT inhaler Inhale 1-2 puffs into the lungs every 6 (six) hours as needed for wheezing or shortness of breath. 04/21/22  Yes Halaina Vanduzer A, PA-C  benzonatate (TESSALON) 100 MG capsule Take 1 capsule (100 mg total) by mouth every 8 (eight) hours. 04/21/22  Yes Carolle Ishii A, PA-C  levofloxacin (LEVAQUIN) 500 MG tablet Take 1 tablet (500 mg total) by mouth daily. 04/21/22  Yes Carole Doner A, PA-C  predniSONE (DELTASONE) 20 MG tablet Take 1 tablet (20 mg total) by mouth daily for 5 days. 04/21/22 04/26/22 Yes Chrishawn Kring A, PA-C  Bayer Microlet Lancets lancets USE 1 SINGLE USE LANCETS AS NEEDED TO CHECK BLOOD SUGAR 04/24/19   [name], MD  CONTOUR NEXT TEST test strip USE 1 LANCETS AS NEEDED TO CHECK BLOOD SUGAR 03/30/20   [name], MD  EPINEPHrine (EPIPEN 2-PAK) 0.3 mg/0.3 mL IJ SOAJ injection Inject 0.3 mg into the muscle as needed for anaphylaxis. 10/08/20   [name], MD  escitalopram  (LEXAPRO) 5 MG tablet TAKE 1 TABLET (5 MG TOTAL) BY MOUTH DAILY. 11/02/21   Ardith Dark, MD  hydrOXYzine (ATARAX) 25 MG tablet Take 1 tablet (25 mg total) by mouth every 6 (six) hours. 10/09/21   Blue, Soijett A, PA-C  ibuprofen (ADVIL) 800 MG tablet Take 1 tablet (800 mg total) by mouth every 8 (eight) hours as needed. 09/21/21   Romualdo Bolk, MD  rosuvastatin (CRESTOR) 10 MG tablet Take 1 tablet (10 mg total) by mouth daily. 10/12/20   [name], MD  Semaglutide, 1 MG/DOSE, (OZEMPIC, 1 MG/DOSE,) 4 MG/3ML SOPN INJECT 1 MG INTO THE SKIN ONE TIME PER WEEK 02/07/22   Ardith Dark, MD  tiZANidine (ZANAFLEX) 4 MG tablet Take 4 mg by mouth at bedtime as needed. 01/28/22   [provider]      Allergies    Bee venom and Reglan [metoclopramide]    Review of Systems   Review of Systems  Constitutional:  Positive for activity change, appetite change and fatigue.  HENT:  Positive for congestion, postnasal drip and rhinorrhea. Negative for mouth sores, nosebleeds, sinus pressure, sinus pain, sneezing, sore throat, tinnitus, trouble swallowing and voice change.   Respiratory:  Positive for cough, chest tightness and shortness of breath. Negative for apnea, choking, wheezing and stridor.   Cardiovascular:  Positive for chest pain. Negative for palpitations and leg swelling.  Gastrointestinal: Negative.   Genitourinary: Negative.   Musculoskeletal: Negative.   Skin:  Negative.   Neurological: Negative.   All other systems reviewed and are negative.   Physical Exam Updated Vital Signs BP 135/76   Pulse 96   Temp 98.1 F (36.7 C) (Oral)   Resp 17   Ht 5\' 4"  (1.626 m)   Wt 93 kg   LMP 02/06/2014 (LMP Unknown)   SpO2 96%   BMI 35.19 kg/m  Physical Exam Vitals and nursing note reviewed.  Constitutional:      General: She is not in acute distress.    Appearance: She is well-developed. She is not ill-appearing, toxic-appearing or diaphoretic.  HENT:     Head: Atraumatic.   Eyes:     Pupils: Pupils are equal, round, and reactive to light.  Cardiovascular:     Rate and Rhythm: Normal rate.     Pulses:          Radial pulses are 2+ on the right side and 2+ on the left side.     Heart sounds: Normal heart sounds.  Pulmonary:     Effort: Pulmonary effort is normal. No respiratory distress.     Breath sounds: Normal breath sounds.     Comments: Decreased air movement however no wheeze, rhonchi or rales. Speaks in full sentences without difficulty Chest:     Comments: Non tender, no crepitus Abdominal:     General: Bowel sounds are normal. There is no distension or abdominal bruit.     Palpations: Abdomen is soft. There is no mass.     Tenderness: There is no abdominal tenderness. There is no guarding or rebound.  Musculoskeletal:        General: Normal range of motion.     Cervical back: Normal range of motion.     Right lower leg: No tenderness. No edema.     Left lower leg: No tenderness. No edema.     Comments: No bony tenderness, compartments soft. Homan neg BIL  Skin:    General: Skin is warm and dry.  Neurological:     General: No focal deficit present.     Mental Status: She is alert and oriented to person, place, and time.     Comments: Ambulatory, equal strength  Psychiatric:        Mood and Affect: Mood normal.     ED Results / Procedures / Treatments   Labs (all labs ordered are listed, but only abnormal results are displayed) Labs Reviewed  BASIC METABOLIC PANEL - Abnormal; Notable for the following components:      Result Value   Glucose, Bld 168 (*)    All other components within normal limits  CBC WITH DIFFERENTIAL/PLATELET - Abnormal; Notable for the following components:   Lymphs Abs 5.0 (*)    All other components within normal limits  RESP PANEL BY RT-PCR (FLU A&B, COVID) [id number]  D-DIMER, QUANTITATIVE (NOT AT [facility])  TROPONIN I (HIGH SENSITIVITY)  TROPONIN I (HIGH SENSITIVITY)    EKG None  Radiology DG Chest 2  View  Result Date: 04/21/2022 CLINICAL DATA:  Cough shortness of breath and chest tightness in a 46 year old female. EXAM: CHEST - 2 VIEW COMPARISON:  April 20, 2022 FINDINGS: LEFT basilar airspace disease just over LEFT hemidiaphragm without lobar level consolidation or sign of pleural effusion. Trachea midline. Cardiomediastinal contours and hilar structures normal. On limited assessment no acute skeletal process. IMPRESSION: LEFT basilar airspace disease just over LEFT hemidiaphragm may represent atelectasis or developing infection. No lobar consolidation or sign of pleural effusion. Electronically Signed  By: Zetta Bills M.D.   On: 04/21/2022 12:01   DG Chest 2 View  Result Date: 04/20/2022 CLINICAL DATA:  Cough EXAM: CHEST - 2 VIEW COMPARISON:  10/09/2021 FINDINGS: The heart size and mediastinal contours are within normal limits. Both lungs are clear. The visualized skeletal structures are unremarkable. IMPRESSION: No active cardiopulmonary disease. Electronically Signed   By: Inez Catalina M.D.   On: 04/20/2022 19:58    Procedures Procedures    Medications Ordered in ED Medications  ipratropium-albuterol (DUONEB) 0.5-2.5 (3) MG/3ML nebulizer solution 3 mL (3 mLs Nebulization Given 04/21/22 1210)  methylPREDNISolone sodium succinate (SOLU-MEDROL) 125 mg/2 mL injection 125 mg (125 mg Intravenous Given 04/21/22 1210)  ipratropium-albuterol (DUONEB) 0.5-2.5 (3) MG/3ML nebulizer solution 3 mL (3 mLs Nebulization Given 04/21/22 2104)  acetaminophen (TYLENOL) tablet 650 mg (650 mg Oral Given 04/21/22 2104)  levofloxacin (LEVAQUIN) tablet 500 mg (500 mg Oral Given 04/21/22 2115)    ED Course/ Medical Decision Making/ A&P    46 year old here for evaluation of cough with few episodes of blood-tinged sputum, right-sided chest pain.  Recently treated with azithromycin for bronchitis.  She continues to have symptoms.  She has no clinical evidence of VTE on exam.  On arrival she was initially  tachycardic.  Unable to [facility] due to some hemoptysis and tachycardia, will get D-dimer.  Does have some overall decreased air movement however no wheeze.  No respiratory distress.  No hypoxia.  No evidence of volume overload.  Does not appear septic.  We will plan on labs and imaging  Labs and imaging personally viewed and interpreted:  CBC without leukocytosis BMP glucose 168 Trop 4>>7 COVID, Flu neg Chest xray without possible infiltrates EKG without ischemic changes  Patient reassessed. Ddimer WNL.  No further hemoptysis here in the emergency department.  She was given DuoNeb with relief of her chest tightness.  I suspect her symptoms likely due to her pneumonia on her chest x-ray.  She failed azithromycin, will wait for Levaquin.  She was given steroids and albuterol for her wheeze.  As well as Best boy.  Follow-up with PCP early next week for recheck of her symptoms.  She will return for new or worsening symptoms.  At this time I believe suspicion for acute ACS, PE, dissection, edema, pneumothorax, sepsis as cause of her symptoms.  The patient has been appropriately medically screened and/or stabilized in the ED. I have low suspicion for any other emergent medical condition which would require further screening, evaluation or treatment in the ED or require inpatient management.  Patient is hemodynamically stable and in no acute distress.  Patient able to ambulate in department prior to ED.  Evaluation does not show acute pathology that would require ongoing or additional emergent interventions while in the emergency department or further inpatient treatment.  I have discussed the diagnosis with the patient and answered all questions.  Pain is been managed while in the emergency department and patient has no further complaints prior to discharge.  Patient is comfortable with plan discussed in room and is stable for discharge at this time.  I have discussed strict return precautions for  returning to the emergency department.  Patient was encouraged to follow-up with PCP/specialist refer to at discharge.                            Medical Decision Making Amount and/or Complexity of Data Reviewed External Data Reviewed: labs, radiology, ECG and notes.  Labs: ordered. Decision-making details documented in ED Course. Radiology: ordered and independent interpretation performed. Decision-making details documented in ED Course. ECG/medicine tests: ordered and independent interpretation performed. Decision-making details documented in ED Course.  Risk OTC drugs. Prescription drug management. Parenteral controlled substances. Decision regarding hospitalization. Diagnosis or treatment significantly limited by social determinants of health.          Final Clinical Impression(s) / ED Diagnoses Final diagnoses:  Pneumonia of left lower lobe due to infectious organism    Rx / DC Orders ED Discharge Orders          Ordered    levofloxacin (LEVAQUIN) 500 MG tablet  Daily        04/21/22 2029    benzonatate (TESSALON) 100 MG capsule  Every 8 hours        04/21/22 2029    predniSONE (DELTASONE) 20 MG tablet  Daily        04/21/22 2029    albuterol (VENTOLIN HFA) 108 (90 Base) MCG/ACT inhaler  Every 6 hours PRN        04/21/22 2029              Tattianna Schnarr A, PA-C 04/21/22 2117    Fredia Sorrow, MD 04/23/22 0002

## 2022-04-23 NOTE — ED Provider Notes (Signed)
[facility]   [id number] 04/20/22 Arrival Time: 1753  ASSESSMENT & PLAN:  1. Acute cough    I have personally viewed the imaging studies ordered this visit. No acute changes appreciated on CXR. No PNA.  Has finished Zithromax.  BP 127/81 (BP Location: Right Arm)   Pulse 75   Temp 97.8 F (36.6 C) (Oral)   Resp 19   LMP 02/06/2014 (LMP Unknown)   SpO2 96%  VSS. OTC symptom care as needed.  Discharge Medication List as of 04/20/2022  8:13 PM     START taking these medications   Details  benzonatate (TESSALON) 100 MG capsule Take 1 capsule by mouth every 8 (eight) hours for cough., Normal       Will f/u here if worsening over the next few days.  Reviewed expectations re: course of current medical issues. Questions answered. Outlined signs and symptoms indicating need for more acute intervention. Understanding verbalized. After Visit Summary given.   SUBJECTIVE: History from: Patient. TAMARA MONTEITH is a 46 y.o. female.  Pt reports still having cough even after taking z-pack. Reports neck pain and right ear pain. Pt works at a daycare.  Denies fever/SOB/CP. Normal PO intake without n/v/d.  OBJECTIVE:  Vitals:   04/20/22 1900  BP: 127/81  Pulse: 75  Resp: 19  Temp: 97.8 F (36.6 C)  TempSrc: Oral  SpO2: 96%    General appearance: alert; no distress Eyes: PERRLA; EOMI; conjunctiva normal HENT: [place]; AT; with mild nasal congestion; both ears with slight serous otitis Neck: supple  Lungs: speaks full sentences without difficulty; unlabored; CTAB Extremities: no edema Skin: warm and dry Neurologic: normal gait Psychological: alert and cooperative; normal mood and affect   Imaging: DG Chest 2 View  Result Date: 04/20/2022 CLINICAL DATA:  Cough EXAM: CHEST - 2 VIEW COMPARISON:  10/09/2021 FINDINGS: The heart size and mediastinal contours are within normal limits. Both lungs are clear. The visualized skeletal structures are unremarkable.  IMPRESSION: No active cardiopulmonary disease. Electronically Signed   By: Alcide Clever M.D.   On: 04/20/2022 19:58      Allergies  Allergen Reactions   Bee Venom Anaphylaxis   Reglan [Metoclopramide] Nausea And Vomiting and Anxiety    Past Medical History:  Diagnosis Date   Anemia    History   Diabetes mellitus without complication (HCC)    diet and exercise controlled - no meds   Hypertension    Obesity    Plantar fasciitis, bilateral    History - recvd cortisone injections bilateral- no current prob as 01/2014   SVD (spontaneous vaginal delivery)    x 2   Social History   Socioeconomic History   Marital status: Divorced    Spouse name: Not on file   Number of children: Not on file   Years of education: Not on file   Highest education level: Not on file  Occupational History   Not on file  Tobacco Use   Smoking status: Never   Smokeless tobacco: Never  Vaping Use   Vaping Use: Never used  Substance and Sexual Activity   Alcohol use: No   Drug use: No   Sexual activity: Not Currently    Birth control/protection: Surgical  Other Topics Concern   Not on file  Social History Narrative   Not on file   Social Determinants of Health   Financial Resource Strain: Not on file  Food Insecurity: Not on file  Transportation Needs: Not on file  Physical Activity:  Not on file  Stress: Not on file  Social Connections: Not on file  Intimate Partner Violence: Not on file   Family History  Problem Relation Age of Onset   Diabetes Mother    Heart disease Mother    Hypertension Mother    Hypertension Father    High Cholesterol Father    Hypertension Sister    Kidney disease Sister    Stroke Sister    Hypertension Sister    Breast cancer Maternal Aunt    Hearing loss Son    Cancer Other    Hypertension Other    Diabetes Other    Past Surgical History:  Procedure Laterality Date   CYSTOSCOPY N/A 02/24/2014   Procedure: CYSTOSCOPY;  Surgeon: Essie Hart, MD;   Location: [facility];  Service: Gynecology;  Laterality: N/A;   DILATION AND CURETTAGE OF UTERUS     MAB   LAPAROSCOPIC ASSISTED VAGINAL HYSTERECTOMY N/A 02/24/2014   Procedure: LAPAROSCOPIC ASSISTED VAGINAL HYSTERECTOMY WITH BILATERAL SALPINGECTOMY ;  Surgeon: Essie Hart, MD;  Location: [facility];  Service: Gynecology;  Laterality: N/A;   TUBAL LIGATION     WISDOM TOOTH EXTRACTION       Mardella Layman, MD 04/23/22 1323

## 2022-04-26 ENCOUNTER — Other Ambulatory Visit: Payer: Self-pay | Admitting: Obstetrics and Gynecology

## 2022-04-26 ENCOUNTER — Ambulatory Visit: Payer: Commercial Managed Care - HMO | Admitting: Family Medicine

## 2022-04-26 DIAGNOSIS — R102 Pelvic and perineal pain: Secondary | ICD-10-CM

## 2022-04-27 ENCOUNTER — Ambulatory Visit (INDEPENDENT_AMBULATORY_CARE_PROVIDER_SITE_OTHER)
Admission: RE | Admit: 2022-04-27 | Discharge: 2022-04-27 | Disposition: A | Discharge status: Home / Self Care | Payer: Commercial Managed Care - HMO | Source: Ambulatory Visit | Attending: Family Medicine | Admitting: Family Medicine

## 2022-04-27 ENCOUNTER — Ambulatory Visit (INDEPENDENT_AMBULATORY_CARE_PROVIDER_SITE_OTHER): Payer: Commercial Managed Care - HMO | Admitting: Family Medicine

## 2022-04-27 ENCOUNTER — Encounter: Payer: Self-pay | Admitting: Family Medicine

## 2022-04-27 VITALS — BP 108/75 | HR 78 | Temp 97.8°F | Ht 64.0 in | Wt 202.4 lb

## 2022-04-27 DIAGNOSIS — J189 Pneumonia, unspecified organism: Secondary | ICD-10-CM | POA: Diagnosis not present

## 2022-04-27 DIAGNOSIS — E119 Type 2 diabetes mellitus without complications: Secondary | ICD-10-CM

## 2022-04-27 DIAGNOSIS — I152 Hypertension secondary to endocrine disorders: Secondary | ICD-10-CM

## 2022-04-27 DIAGNOSIS — E1159 Type 2 diabetes mellitus with other circulatory complications: Secondary | ICD-10-CM

## 2022-04-27 DIAGNOSIS — F411 Generalized anxiety disorder: Secondary | ICD-10-CM | POA: Diagnosis not present

## 2022-04-27 MED ORDER — CEFTRIAXONE SODIUM 1 G IJ SOLR
1.0000 g | Freq: Once | INTRAMUSCULAR | Status: AC
  Administered 2022-04-27: 1 g via INTRAMUSCULAR

## 2022-04-27 MED ORDER — ESCITALOPRAM OXALATE 5 MG PO TABS: 5.0000 mg | ORAL_TABLET | Freq: Every day | ORAL | 2 refills | Status: DC

## 2022-04-27 MED ORDER — CEFPODOXIME PROXETIL 200 MG PO TABS: 200.0000 mg | ORAL_TABLET | Freq: Two times a day (BID) | ORAL | 0 refills | Status: AC

## 2022-04-27 NOTE — Assessment & Plan Note (Signed)
Symptoms are stable.  Will refill Lexapro 5 mg daily.

## 2022-04-27 NOTE — Assessment & Plan Note (Signed)
She will come back soon to check A1c.  Currently on Ozempic 1 mg weekly.

## 2022-04-27 NOTE — Telephone Encounter (Signed)
Medication refill request: ibuprofen 800mg  Last OV: 09-21-21 Next AEX: not scheduled Last MMG (if hormonal medication request): n/a Refill authorized: patient seen in ED for pneumonia of left lower lobe 04-21-22. Rx came in for ibuprofen. Last given at appt in May. Please approve or deny as appropriate

## 2022-04-27 NOTE — Progress Notes (Signed)
   Renee Knapp is a 46 y.o. female who presents today for an office visit.  Assessment/Plan:  New/Acute Problems: Community Acquired Pneumonia Vital signs are stable and she has no signs of respiratory distress however unfortunately she has not had much improvement in her symptoms despite Levaquin for the last several days.  She does have some rhonchi on exam but no other significant abnormalities.  Given lack of improvement in symptoms will check x-ray again today to look for any progression of her pneumonia.  We will also give 1000 mg of ceftriaxone today and start a 5-day course of cefpodoxime tomorrow. If symptoms progress despite outpatient antibiotics or fail to respond then she will need to go back to the emergency room for IV antibiotics. Encourage hydration.  She can continue using over-the-counter meds.  We discussed reasons to return to care.  Discussed reasons to seek emergent care.    Chronic Problems Addressed Today: Hypertension associated with diabetes (HCC) Blood pressure at goal today.  Controlled type 2 diabetes mellitus without complication, without long-term current use of insulin (HCC) She will come back soon to check A1c.  Currently on Ozempic 1 mg weekly.  GAD (generalized anxiety disorder) Symptoms are stable.  Will refill Lexapro 5 mg daily.    Subjective:  HPI:  Patient here for ED follow-up. Went to the urgent care on 11/22 and 11/29 and then to the ED on 11/30.  Initially was diagnosed with acute bronchitis however her most recent visit she was diagnosed with pneumonia.  She was initially given a prescription for azithromycin for her bronchitis however symptoms did not improve.  In the ED she was given some DuoNebs.  She was transition from azithromycin to Levaquin.  Over the last week she has had persistent symptoms. Her cough has persisted but hemoptysis as resolved. She has had more shortness of breath. Still having some chest pain. She feels like her  symptoms have not improved. No fevers or chills. Several sick contacts at work. She is not sleeping great. Appetite has been diminished.        Objective:  Physical Exam: BP 108/75   Pulse 78   Temp 97.8 F (36.6 C) (Temporal)   Ht 5\' 4"  (1.626 m)   Wt 202 lb 6.4 oz (91.8 kg)   LMP 02/06/2014 (LMP Unknown)   SpO2 98%   BMI 34.74 kg/m   Gen: No acute distress, resting comfortably CV: Regular rate and rhythm with no murmurs appreciated Pulm: Normal work of breathing, rhonchi and crackles noted left lung base. Neuro: Grossly normal, moves all extremities Psych: Normal affect and thought content      Trentan Trippe M. 02/08/2014, MD 04/27/2022 9:24 AM

## 2022-04-27 NOTE — Addendum Note (Signed)
Addended by: Dyann Kief on: 04/27/2022 09:57 AM   Modules accepted: Orders

## 2022-04-27 NOTE — Assessment & Plan Note (Signed)
Blood pressure at goal today. 

## 2022-04-27 NOTE — Patient Instructions (Signed)
It was very nice to see you today!  We will get an x-ray today to further evaluate the progression of your pneumonia.  We will give you an injection of antibiotic today.  Please start the other antibiotic tomorrow.  Make sure that you are getting plenty of fluids and staying well-hydrated.  Please let me know if not proving over the next several days.  Take care, Dr [name]  PLEASE NOTE:  If you had any lab tests please let us know if you have not heard back within a few days. You may see your results on mychart before we have a chance to review them but we will give you a call once they are reviewed by [place]. If we ordered any referrals today, please let us know if you have not heard from their office within the next week.   Please try these tips to maintain a healthy lifestyle:  Eat at least 3 REAL meals and 1-2 snacks per day.  Aim for no more than 5 hours between eating.  If you eat breakfast, please do so within one hour of getting up.   Each meal should contain half fruits/vegetables, one quarter protein, and one quarter carbs (no bigger than a computer mouse)  Cut down on sweet beverages. This includes juice, soda, and sweet tea.   Drink at least 1 glass of water with each meal and aim for at least 8 glasses per day  Exercise at least 150 minutes every week.

## 2022-04-28 ENCOUNTER — Other Ambulatory Visit: Payer: Self-pay | Admitting: Family Medicine

## 2022-04-28 NOTE — Telephone Encounter (Signed)
Pharmacy comment: Alternative Requested:NOT COVERED PLEASE ADVISE.

## 2022-04-28 NOTE — Telephone Encounter (Signed)
CAn we check with patient and pharmacy? She has diabetes. Are they no longer covering her ozempic?  Renee Knapp. [name], MD 04/28/2022 1:10 PM

## 2022-04-29 NOTE — Progress Notes (Signed)
Please inform patient of the following:  Chest x-ray was clear with no signs of pneumonia.  Would like for her to let us know if her symptoms not improving.

## 2022-05-09 ENCOUNTER — Ambulatory Visit: Payer: 59 | Admitting: Family Medicine

## 2022-05-12 ENCOUNTER — Ambulatory Visit (HOSPITAL_COMMUNITY)
Admission: EM | Admit: 2022-05-12 | Discharge: 2022-05-12 | Disposition: A | Discharge status: Home / Self Care | Payer: Commercial Managed Care - HMO | Attending: Physician Assistant | Admitting: Physician Assistant

## 2022-05-12 ENCOUNTER — Ambulatory Visit (INDEPENDENT_AMBULATORY_CARE_PROVIDER_SITE_OTHER): Payer: Commercial Managed Care - HMO

## 2022-05-12 ENCOUNTER — Encounter (HOSPITAL_COMMUNITY): Payer: Self-pay | Admitting: Emergency Medicine

## 2022-05-12 ENCOUNTER — Other Ambulatory Visit: Payer: Self-pay

## 2022-05-12 DIAGNOSIS — Z1152 Encounter for screening for COVID-19: Secondary | ICD-10-CM | POA: Insufficient documentation

## 2022-05-12 DIAGNOSIS — R059 Cough, unspecified: Secondary | ICD-10-CM

## 2022-05-12 DIAGNOSIS — Z8701 Personal history of pneumonia (recurrent): Secondary | ICD-10-CM | POA: Insufficient documentation

## 2022-05-12 DIAGNOSIS — Z79899 Other long term (current) drug therapy: Secondary | ICD-10-CM | POA: Insufficient documentation

## 2022-05-12 DIAGNOSIS — J069 Acute upper respiratory infection, unspecified: Secondary | ICD-10-CM

## 2022-05-12 LAB — POC INFLUENZA A AND B ANTIGEN (URGENT CARE ONLY)
INFLUENZA A ANTIGEN, POC: NEGATIVE
INFLUENZA B ANTIGEN, POC: NEGATIVE

## 2022-05-12 MED ORDER — ALBUTEROL SULFATE HFA 108 (90 BASE) MCG/ACT IN AERS: 1.0000 | INHALATION_SPRAY | Freq: Four times a day (QID) | RESPIRATORY_TRACT | 0 refills | Status: AC | PRN

## 2022-05-12 MED ORDER — PROMETHAZINE-DM 6.25-15 MG/5ML PO SYRP: 5.0000 mL | ORAL_SOLUTION | Freq: Three times a day (TID) | ORAL | 0 refills | Status: DC | PRN

## 2022-05-12 NOTE — ED Triage Notes (Signed)
Symptoms started last night of general aches, headaches, nausea, and bilateral ear pain.  Patient was seen in ED 04/21/2022 and diagnosed with pneumonia.  Patient has finished antibiotic -approximately 2 weeks ago  Patient works in a daycare

## 2022-05-12 NOTE — Discharge Instructions (Signed)
Your x-ray did not show any evidence of infection or pneumonia which is great news.  You are negative for flu.  I am concerned that you have a different viral illness.  We will contact you if you are positive for COVID.  Please monitor your MyChart for these results.  Use albuterol every 4-6 hours as needed for shortness of breath and coughing fits.  Use Promethazine DM for cough.  This will make you sleepy so do not drive or drink alcohol with taking it.  Alternate Tylenol and ibuprofen.  Make sure you rest and drink plenty of fluid.  If you have any worsening or changing symptoms including high fever, chest pain, worsening cough, shortness of breath, nausea/vomiting interfere with oral intake, weakness you need to be seen immediately.  If your symptoms do not improving by next week please return here or see your PCP.

## 2022-05-12 NOTE — ED Notes (Signed)
Covid and flu obtained, each labeled at bedside and placed in mini lab

## 2022-05-12 NOTE — ED Provider Notes (Signed)
[facility]    CSN: [id number] Arrival date & time: 05/12/22  1358      History   Chief Complaint Chief Complaint  Patient presents with   Generalized Body Aches    HPI Renee Knapp is a 46 y.o. female.   Patient presents today with a 24-hour history of URI symptoms including cough, headache, nausea, congestion, otalgia.  Denies any fever, chest pain, shortness of breath, nausea, vomiting.  She was seen in the emergency room on 04/21/2022 at which point she was started on Levaquin and prednisone for pneumonia.  Reports she has completed course of medication and did have improvement but never resolution of cough.  Current symptoms are significantly more intense than she had been feeling for the past several weeks.  She has been taking over-the-counter medication without improvement of symptoms.  She denies any known sick contacts but does work in [employer] and has had several kids out sick recently.  She has had a COVID-vaccine and flu shot but has not had most recent booster.  She has had COVID in the past.  Denies history of allergies, asthma, COPD, smoking.  Denies any recent antibiotics or steroids.    Past Medical History:  Diagnosis Date   Anemia    History   Diabetes mellitus without complication ([place])    diet and exercise controlled - no meds   Hypertension    Obesity    Plantar fasciitis, bilateral    History - recvd cortisone injections bilateral- no current prob as 01/2014   SVD (spontaneous vaginal delivery)    x 2    Patient Active Problem List   Diagnosis Date Noted   Lateral epicondylitis, right elbow 03/15/2022   Probable carpal tunnel syndrome, right 03/15/2022   Hypertension associated with diabetes ([place]) 03/10/2021   GAD (generalized anxiety disorder) 08/30/2018   Vitamin D deficiency 01/18/2018   Dyslipidemia associated with type 2 diabetes mellitus ([place]) 11/25/2017   Controlled type 2 diabetes mellitus without complication, without  long-term current use of insulin ([place]) 11/10/2017   S/P laparoscopic assisted vaginal hysterectomy (LAVH) 02/24/2014    Past Surgical History:  Procedure Laterality Date   CYSTOSCOPY N/A 02/24/2014   Procedure: CYSTOSCOPY;  Surgeon: Sanjuana Kava, MD;  Location: [facility];  Service: Gynecology;  Laterality: N/A;   DILATION AND CURETTAGE OF UTERUS     MAB   LAPAROSCOPIC ASSISTED VAGINAL HYSTERECTOMY N/A 02/24/2014   Procedure: LAPAROSCOPIC ASSISTED VAGINAL HYSTERECTOMY WITH BILATERAL SALPINGECTOMY ;  Surgeon: Sanjuana Kava, MD;  Location: [facility];  Service: Gynecology;  Laterality: N/A;   TUBAL LIGATION     WISDOM TOOTH EXTRACTION      OB History     Gravida  3   Para      Term      Preterm      AB  1   Living  2      SAB  1   IAB      Ectopic      Multiple      Live Births               Home Medications    Prior to Admission medications   Medication Sig Start Date End Date Taking? Authorizing Provider  promethazine-dextromethorphan (PROMETHAZINE-DM) 6.25-15 MG/5ML syrup Take 5 mLs by mouth 3 (three) times daily as needed for cough. 05/12/22  Yes Dillen Belmontes K, PA-C  albuterol (VENTOLIN HFA) 108 (90 Base) MCG/ACT inhaler Inhale 1-2 puffs into the lungs every 6 (  six) hours as needed for wheezing or shortness of breath. 05/12/22   Semiyah Newgent, Derry Skill, PA-C  Bayer Microlet Lancets lancets USE 1 SINGLE USE LANCETS AS NEEDED TO CHECK BLOOD SUGAR 04/24/19   Orma Flaming, MD  benzonatate (TESSALON) 100 MG capsule Take 1 capsule (100 mg total) by mouth every 8 (eight) hours. 04/21/22   Henderly, Britni A, PA-C  CONTOUR NEXT TEST test strip USE 1 LANCETS AS NEEDED TO CHECK BLOOD SUGAR 03/30/20   Orma Flaming, MD  EPINEPHrine (EPIPEN 2-PAK) 0.3 mg/0.3 mL IJ SOAJ injection Inject 0.3 mg into the muscle as needed for anaphylaxis. 10/08/20   Orma Flaming, MD  escitalopram (LEXAPRO) 5 MG tablet Take 1 tablet (5 mg total) by mouth daily. Patient not taking: Reported on 05/12/2022  04/27/22   Vivi Barrack, MD  hydrOXYzine (ATARAX) 25 MG tablet Take 1 tablet (25 mg total) by mouth every 6 (six) hours. 10/09/21   Blue, Soijett A, PA-C  ibuprofen (ADVIL) 800 MG tablet TAKE 1 TABLET BY MOUTH EVERY 8 HOURS AS NEEDED 04/27/22   Salvadore Dom, MD  rosuvastatin (CRESTOR) 10 MG tablet Take 1 tablet (10 mg total) by mouth daily. 10/12/20   Orma Flaming, MD  Semaglutide, 1 MG/DOSE, (OZEMPIC, 1 MG/DOSE,) 4 MG/3ML SOPN INJECT 1 MG INTO THE SKIN ONE TIME PER WEEK Patient not taking: Reported on 05/12/2022 02/07/22   Vivi Barrack, MD  tiZANidine (ZANAFLEX) 4 MG tablet Take 4 mg by mouth at bedtime as needed. Patient not taking: Reported on 05/12/2022 01/28/22   [provider]    Family History Family History  Problem Relation Age of Onset   Diabetes Mother    Heart disease Mother    Hypertension Mother    Hypertension Father    High Cholesterol Father    Hypertension Sister    Kidney disease Sister    Stroke Sister    Hypertension Sister    Breast cancer Maternal Aunt    Hearing loss Son    Cancer Other    Hypertension Other    Diabetes Other     Social History Social History   Tobacco Use   Smoking status: Never   Smokeless tobacco: Never  Vaping Use   Vaping Use: Never used  Substance Use Topics   Alcohol use: No   Drug use: No     Allergies   Bee venom and Reglan [metoclopramide]   Review of Systems Review of Systems  Constitutional:  Positive for activity change and fatigue. Negative for appetite change and fever.  HENT:  Positive for congestion and sinus pressure. Negative for sneezing and sore throat.   Respiratory:  Positive for cough. Negative for shortness of breath.   Cardiovascular:  Negative for chest pain.  Gastrointestinal:  Negative for abdominal pain, diarrhea, nausea and vomiting.  Musculoskeletal:  Positive for arthralgias, back pain and myalgias.  Neurological:  Positive for headaches. Negative for dizziness and  light-headedness.     Physical Exam Triage Vital Signs ED Triage Vitals  Enc Vitals Group     BP 05/12/22 1659 113/82     Pulse Rate 05/12/22 1659 86     Resp 05/12/22 1659 20     Temp --      Temp Source 05/12/22 1659 Oral     SpO2 05/12/22 1659 97 %     Weight --      Height --      Head Circumference --      Peak Flow --  Pain Score 05/12/22 1655 8     Pain Loc --      Pain Edu? --      Excl. in [place]? --    No data found.  Updated Vital Signs BP 113/82 (BP Location: Left Arm) Comment (BP Location): large cuff  Pulse 86   Resp 20   LMP 02/06/2014 (LMP Unknown)   SpO2 97%   Visual Acuity Right Eye Distance:   Left Eye Distance:   Bilateral Distance:    Right Eye Near:   Left Eye Near:    Bilateral Near:     Physical Exam Vitals reviewed.  Constitutional:      General: She is awake. She is not in acute distress.    Appearance: Normal appearance. She is well-developed. She is not ill-appearing.     Comments: Very pleasant female appears stated age in no acute distress sitting comfortably in exam room  HENT:     Head: Normocephalic and atraumatic.     Right Ear: Tympanic membrane, ear canal and external ear normal. Tympanic membrane is not erythematous or bulging.     Left Ear: Tympanic membrane, ear canal and external ear normal. Tympanic membrane is not erythematous or bulging.     Nose:     Right Sinus: Frontal sinus tenderness present. No maxillary sinus tenderness.     Left Sinus: Frontal sinus tenderness present. No maxillary sinus tenderness.     Mouth/Throat:     Pharynx: Uvula midline. Posterior oropharyngeal erythema present. No oropharyngeal exudate.  Cardiovascular:     Rate and Rhythm: Normal rate and regular rhythm.     Heart sounds: Normal heart sounds, S1 normal and S2 normal. No murmur heard. Pulmonary:     Effort: Pulmonary effort is normal.     Breath sounds: Normal breath sounds. No wheezing, rhonchi or rales.     Comments: Clear to  auscultation bilaterally Psychiatric:        Behavior: Behavior is cooperative.      UC Treatments / Results  Labs (all labs ordered are listed, but only abnormal results are displayed) Labs Reviewed  SARS CORONAVIRUS 2 (TAT 6-24 HRS)  POC INFLUENZA A AND B ANTIGEN (URGENT CARE ONLY)    EKG   Radiology DG Chest 2 View  Result Date: 05/12/2022 CLINICAL DATA:  Cough, pneumonia. EXAM: CHEST - 2 VIEW COMPARISON:  Chest x-ray 04/27/2022 FINDINGS: The heart size and mediastinal contours are within normal limits. Both lungs are clear. The visualized skeletal structures are unremarkable. IMPRESSION: No active cardiopulmonary disease. Electronically Signed   By: Ronney Asters M.D.   On: 05/12/2022 17:27    Procedures Procedures (including critical care time)  Medications Ordered in UC Medications - No data to display  Initial Impression / Assessment and Plan / UC Course  I have reviewed the triage vital signs and the nursing notes.  Pertinent labs & imaging results that were available during my care of the patient were reviewed by me and considered in my medical decision making (see chart for details).     Patient is well-appearing, nontachycardic, in no acute distress.  I suspect that her symptoms are related to a viral illness.  There is no evidence of acute infection on physical exam that would warrant initiation of antibiotics.  Chest x-ray was repeated given recent bout of pneumonia with ongoing cough but this showed no evidence of acute cardiopulmonary disease.  Flu testing was negative in clinic.  COVID testing is pending.  She is a  candidate for antiviral therapy if positive for COVID based on history of diabetes.  She had a metabolic panel on 22/41/1464 that showed EGFR of greater than 60.  She would be a candidate for Paxlovid but if taking this medication she will need to hold her rosuvastatin while on the Paxlovid and for 3 days after completing course.  She was given  albuterol inhaler for shortness of breath with instruction to use this every 4-6 hours as needed.  She can use over-the-counter medication including Mucinex, Flonase, Tylenol.  She is to rest and drink plenty of fluid.  She was given Promethazine DM for cough and discussed that this can be sedating and she is not to drive or drink alcohol with taking it.  If her symptoms are improving within a week she is to return for reevaluation.  If she has any worsening symptoms including high fever, chest pain, shortness of breath, nausea/vomiting interfere with oral intake, weakness she needs to be seen immediately.  Strict return precautions given.  Work excuse note with current CDC return to work guidelines provided during visit.  Final Clinical Impressions(s) / UC Diagnoses   Final diagnoses:  Upper respiratory tract infection, unspecified type  History of pneumonia     Discharge Instructions      Your x-ray did not show any evidence of infection or pneumonia which is great news.  You are negative for flu.  I am concerned that you have a different viral illness.  We will contact you if you are positive for COVID.  Please monitor your MyChart for these results.  Use albuterol every 4-6 hours as needed for shortness of breath and coughing fits.  Use Promethazine DM for cough.  This will make you sleepy so do not drive or drink alcohol with taking it.  Alternate Tylenol and ibuprofen.  Make sure you rest and drink plenty of fluid.  If you have any worsening or changing symptoms including high fever, chest pain, worsening cough, shortness of breath, nausea/vomiting interfere with oral intake, weakness you need to be seen immediately.  If your symptoms do not improving by next week please return here or see your PCP.     ED Prescriptions     Medication Sig Dispense Auth. Provider   albuterol (VENTOLIN HFA) 108 (90 Base) MCG/ACT inhaler Inhale 1-2 puffs into the lungs every 6 (six) hours as needed for  wheezing or shortness of breath. 8 g Katena Petitjean K, PA-C   promethazine-dextromethorphan (PROMETHAZINE-DM) 6.25-15 MG/5ML syrup Take 5 mLs by mouth 3 (three) times daily as needed for cough. 118 mL Ellis Mehaffey K, PA-C      PDMP not reviewed this encounter.   Terrilee Croak, PA-C 05/12/22 1809

## 2022-05-13 ENCOUNTER — Other Ambulatory Visit: Payer: Self-pay | Admitting: Family Medicine

## 2022-05-13 LAB — SARS CORONAVIRUS 2 (TAT 6-24 HRS): SARS Coronavirus 2: NEGATIVE

## 2022-05-13 NOTE — Telephone Encounter (Signed)
Rx sent in

## 2022-05-13 NOTE — Telephone Encounter (Signed)
Alternative Requested:NON FORMULARY. 

## 2022-05-26 ENCOUNTER — Ambulatory Visit: Payer: Commercial Managed Care - HMO | Admitting: Family Medicine

## 2022-05-26 NOTE — Progress Notes (Deleted)
   I, Peterson Lombard, LAT, ATC acting as a scribe for Renee Leader, MD.  Renee Knapp is a 47 y.o. female who presents to [facility] today for f/u R arm pain thought to be due to lateral epicondylitis and CTS. Pt is R-hand dominate. 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  Pt was last seen by Dr. Georgina Snell on 03/15/22 and was given a R lateral epicondyle steroid injection and was advised to wear wrist night splints.  Today, patient reports  Dx imaging: 03/07/22 R elbow XR             12/09/21 R elbow XR  Pertinent review of systems: ***  Relevant historical information: ***   Exam:  LMP 02/06/2014 (LMP Unknown)  General: Well Developed, well nourished, and in no acute distress.   MSK: ***    Lab and Radiology Results No results found for this or any previous visit (from the past 72 hour(s)). No results found.     Assessment and Plan: 47 y.o. female with ***   PDMP not reviewed this encounter. No orders of the defined types were placed in this encounter.  No orders of the defined types were placed in this encounter.    Discussed warning signs or symptoms. Please see discharge instructions. Patient expresses understanding.   ***

## 2022-06-07 ENCOUNTER — Encounter: Payer: Self-pay | Admitting: Physician Assistant

## 2022-06-07 ENCOUNTER — Other Ambulatory Visit: Payer: Self-pay

## 2022-06-07 ENCOUNTER — Other Ambulatory Visit: Payer: Self-pay | Admitting: *Deleted

## 2022-06-07 ENCOUNTER — Ambulatory Visit (INDEPENDENT_AMBULATORY_CARE_PROVIDER_SITE_OTHER): Payer: 59 | Admitting: Physician Assistant

## 2022-06-07 ENCOUNTER — Emergency Department (HOSPITAL_BASED_OUTPATIENT_CLINIC_OR_DEPARTMENT_OTHER)
Admission: EM | Admit: 2022-06-07 | Discharge: 2022-06-07 | Disposition: A | Discharge status: Home / Self Care | Payer: 59 | Attending: Emergency Medicine | Admitting: Emergency Medicine

## 2022-06-07 ENCOUNTER — Encounter (HOSPITAL_BASED_OUTPATIENT_CLINIC_OR_DEPARTMENT_OTHER): Payer: Self-pay

## 2022-06-07 VITALS — BP 134/85 | HR 77 | Temp 97.5°F | Ht 64.0 in | Wt 200.4 lb

## 2022-06-07 DIAGNOSIS — S46811A Strain of other muscles, fascia and tendons at shoulder and upper arm level, right arm, initial encounter: Secondary | ICD-10-CM | POA: Diagnosis not present

## 2022-06-07 DIAGNOSIS — X58XXXA Exposure to other specified factors, initial encounter: Secondary | ICD-10-CM | POA: Diagnosis not present

## 2022-06-07 DIAGNOSIS — R202 Paresthesia of skin: Secondary | ICD-10-CM

## 2022-06-07 DIAGNOSIS — R55 Syncope and collapse: Secondary | ICD-10-CM | POA: Diagnosis not present

## 2022-06-07 DIAGNOSIS — Z794 Long term (current) use of insulin: Secondary | ICD-10-CM | POA: Diagnosis not present

## 2022-06-07 DIAGNOSIS — S46911A Strain of unspecified muscle, fascia and tendon at shoulder and upper arm level, right arm, initial encounter: Secondary | ICD-10-CM | POA: Insufficient documentation

## 2022-06-07 DIAGNOSIS — S4991XA Unspecified injury of right shoulder and upper arm, initial encounter: Secondary | ICD-10-CM | POA: Diagnosis not present

## 2022-06-07 DIAGNOSIS — M542 Cervicalgia: Secondary | ICD-10-CM

## 2022-06-07 LAB — URINALYSIS, ROUTINE W REFLEX MICROSCOPIC
Bilirubin Urine: NEGATIVE
Glucose, UA: NEGATIVE mg/dL
Ketones, ur: NEGATIVE mg/dL
Nitrite: NEGATIVE
Specific Gravity, Urine: 1.022 (ref 1.005–1.030)
WBC, UA: >50 WBC/hpf — ABNORMAL HIGH (ref 0–5)
pH: 6.5 (ref 5.0–8.0)

## 2022-06-07 LAB — CBG MONITORING, ED: Glucose-Capillary: 108 mg/dL — ABNORMAL HIGH (ref 70–99)

## 2022-06-07 LAB — CBC
HCT: 43.5 % (ref 36.0–46.0)
Hemoglobin: 13.8 g/dL (ref 12.0–15.0)
MCH: 26.9 pg (ref 26.0–34.0)
MCHC: 31.7 g/dL (ref 30.0–36.0)
MCV: 84.8 fL (ref 80.0–100.0)
Platelets: 259 10*3/uL (ref 150–400)
RBC: 5.13 MIL/uL — ABNORMAL HIGH (ref 3.87–5.11)
RDW: 13.7 % (ref 11.5–15.5)
WBC: 6.2 10*3/uL (ref 4.0–10.5)
nRBC: 0 % (ref 0.0–0.2)

## 2022-06-07 LAB — BASIC METABOLIC PANEL
Anion gap: 8 (ref 5–15)
BUN: 9 mg/dL (ref 6–20)
CO2: 28 mmol/L (ref 22–32)
Calcium: 9.1 mg/dL (ref 8.9–10.3)
Chloride: 103 mmol/L (ref 98–111)
Creatinine, Ser: 0.78 mg/dL (ref 0.44–1.00)
GFR, Estimated: >60 mL/min (ref >60)
Glucose, Bld: 113 mg/dL — ABNORMAL HIGH (ref 70–99)
Potassium: 4.4 mmol/L (ref 3.5–5.1)
Sodium: 139 mmol/L (ref 135–145)

## 2022-06-07 LAB — MAGNESIUM: Magnesium: 2.1 mg/dL (ref 1.7–2.4)

## 2022-06-07 MED ORDER — INSULIN PEN NEEDLE 31G X 5 MM MISC: 2 refills | Status: AC

## 2022-06-07 MED ORDER — KETOROLAC TROMETHAMINE 15 MG/ML IJ SOLN
15.0000 mg | Freq: Once | INTRAMUSCULAR | Status: AC
  Administered 2022-06-07: 15 mg via INTRAVENOUS
  Filled 2022-06-07: qty 1

## 2022-06-07 MED ORDER — SODIUM CHLORIDE 0.9 % IV BOLUS
1000.0000 mL | Freq: Once | INTRAVENOUS | Status: AC
  Administered 2022-06-07: 1000 mL via INTRAVENOUS
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
## 2022-06-07 NOTE — ED Notes (Signed)
EDP notified of pt

## 2022-06-07 NOTE — ED Triage Notes (Signed)
Pt to ED via EMS from urgent care c/o muscles spasm x 1 week. Apparently pt had an unwitnessed syncopal episode prior to arriving to urgent care. Last VS : 145/96, hr 80, 99%RA, CBG 137
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
## 2022-06-07 NOTE — ED Notes (Signed)
Lab notified of add on magnesium.

## 2022-06-07 NOTE — Discharge Instructions (Addendum)
Take 4 over the counter ibuprofen tablets 3 times a day or 2 over-the-counter naproxen tablets twice a day for pain. Also take tylenol 1000mg (2 extra strength) four times a day.    Take imodium for diarrhea. Return for worsening symptoms.

## 2022-06-09 ENCOUNTER — Ambulatory Visit: Payer: 59 | Admitting: Family Medicine

## 2022-06-10 ENCOUNTER — Ambulatory Visit: Payer: 59 | Admitting: Physician Assistant

## 2022-06-14 ENCOUNTER — Encounter: Payer: Self-pay | Admitting: Family Medicine

## 2022-06-14 ENCOUNTER — Ambulatory Visit: Payer: 59 | Admitting: Family Medicine

## 2022-06-14 ENCOUNTER — Other Ambulatory Visit: Payer: Self-pay | Admitting: Family Medicine

## 2022-06-14 ENCOUNTER — Ambulatory Visit (INDEPENDENT_AMBULATORY_CARE_PROVIDER_SITE_OTHER): Payer: 59 | Admitting: Family Medicine

## 2022-06-14 VITALS — BP 135/85 | HR 87 | Temp 97.5°F | Ht 64.0 in | Wt 202.2 lb

## 2022-06-14 DIAGNOSIS — E1159 Type 2 diabetes mellitus with other circulatory complications: Secondary | ICD-10-CM

## 2022-06-14 DIAGNOSIS — F411 Generalized anxiety disorder: Secondary | ICD-10-CM | POA: Diagnosis not present

## 2022-06-14 DIAGNOSIS — R69 Illness, unspecified: Secondary | ICD-10-CM | POA: Diagnosis not present

## 2022-06-14 DIAGNOSIS — I152 Hypertension secondary to endocrine disorders: Secondary | ICD-10-CM

## 2022-06-14 DIAGNOSIS — M542 Cervicalgia: Secondary | ICD-10-CM

## 2022-06-14 MED ORDER — EPINEPHRINE 0.3 MG/0.3ML IJ SOAJ: 0.3000 mg | INTRAMUSCULAR | 1 refills | Status: AC | PRN

## 2022-06-14 MED ORDER — CYCLOBENZAPRINE HCL 10 MG PO TABS: 10.0000 mg | ORAL_TABLET | Freq: Three times a day (TID) | ORAL | 0 refills | Status: DC | PRN

## 2022-06-14 MED ORDER — ONDANSETRON HCL 4 MG PO TABS: 4.0000 mg | ORAL_TABLET | Freq: Three times a day (TID) | ORAL | 0 refills | Status: DC | PRN

## 2022-06-14 MED ORDER — DICLOFENAC SODIUM 75 MG PO TBEC: 75.0000 mg | DELAYED_RELEASE_TABLET | Freq: Two times a day (BID) | ORAL | 0 refills | Status: DC
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
## 2022-06-14 NOTE — Telephone Encounter (Signed)
Alternative Requested:NOT COVERED BY NEW INSURANCE.
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
## 2022-06-14 NOTE — Patient Instructions (Signed)
It was very nice to see you today!  You have a strain in the muscles in your back.  Please start the diclofenac and Flexeril.  Take Zofran as needed for nausea.  Work on the exercises.  We will get a CT scan to further evaluate.  Will also refer you to see physical therapy.  Let me know how your symptoms are progressing over the next few days.  Take care, Dr Jerline Pain  PLEASE NOTE:  If you had any lab tests, please let us know if you have not heard back within a few days. You may see your results on mychart before we have a chance to review them but we will give you a call once they are reviewed by [place].   If we ordered any referrals today, please let us know if you have not heard from their office within the next week.   If you had any urgent prescriptions sent in today, please check with the pharmacy within an hour of our visit to make sure the prescription was transmitted appropriately.   Please try these tips to maintain a healthy lifestyle:  Eat at least 3 REAL meals and 1-2 snacks per day.  Aim for no more than 5 hours between eating.  If you eat breakfast, please do so within one hour of getting up.   Each meal should contain half fruits/vegetables, one quarter protein, and one quarter carbs (no bigger than a computer mouse)  Cut down on sweet beverages. This includes juice, soda, and sweet tea.   Drink at least 1 glass of water with each meal and aim for at least 8 glasses per day  Exercise at least 150 minutes every week.

## 2022-06-14 NOTE — Assessment & Plan Note (Signed)
Blood pressure at goal today without.

## 2022-06-15 ENCOUNTER — Ambulatory Visit (HOSPITAL_BASED_OUTPATIENT_CLINIC_OR_DEPARTMENT_OTHER): Payer: 59

## 2022-06-16 ENCOUNTER — Ambulatory Visit: Payer: 59 | Admitting: Physical Therapy

## 2022-06-16 ENCOUNTER — Telehealth: Payer: Self-pay | Admitting: *Deleted

## 2022-06-16 ENCOUNTER — Encounter: Payer: Self-pay | Admitting: Physical Therapy

## 2022-06-16 DIAGNOSIS — M542 Cervicalgia: Secondary | ICD-10-CM

## 2022-06-16 NOTE — Telephone Encounter (Signed)
Can we clarify what she is on? The pharmacy probably sent this as an automated refill.  Renee Knapp. Jerline Pain, MD 06/16/2022 2:22 PM

## 2022-06-16 NOTE — Therapy (Signed)
OUTPATIENT PHYSICAL THERAPY UPPER EXTREMITY EVALUATION   Patient Name: Renee Knapp MRN: [id number] DOB:01/02/1976, 47 y.o., female Today's Date: 06/16/2022  END OF SESSION:  PT End of Session - 06/20/22 1134     Visit Number 1    Number of Visits 16    Date for PT Re-Evaluation 08/11/22    Authorization Type Aetna    PT Start Time 1305    PT Stop Time 1344    PT Time Calculation (min) 39 min    Activity Tolerance Patient tolerated treatment well    Behavior During Therapy WFL for tasks assessed/performed             Past Medical History:  Diagnosis Date   Anemia    History   Diabetes mellitus without complication ([place])    diet and exercise controlled - no meds   Hypertension    Obesity    Plantar fasciitis, bilateral    History - recvd cortisone injections bilateral- no current prob as 01/2014   SVD (spontaneous vaginal delivery)    x 2   Past Surgical History:  Procedure Laterality Date   CYSTOSCOPY N/A 02/24/2014   Procedure: CYSTOSCOPY;  Surgeon: Sanjuana Kava, MD;  Location: [facility];  Service: Gynecology;  Laterality: N/A;   DILATION AND CURETTAGE OF UTERUS     MAB   LAPAROSCOPIC ASSISTED VAGINAL HYSTERECTOMY N/A 02/24/2014   Procedure: LAPAROSCOPIC ASSISTED VAGINAL HYSTERECTOMY WITH BILATERAL SALPINGECTOMY ;  Surgeon: Sanjuana Kava, MD;  Location: [facility];  Service: Gynecology;  Laterality: N/A;   TUBAL LIGATION     WISDOM TOOTH EXTRACTION     Patient Active Problem List   Diagnosis Date Noted   Lateral epicondylitis, right elbow 03/15/2022   Probable carpal tunnel syndrome, right 03/15/2022   Hypertension associated with diabetes ([place]) 03/10/2021   GAD (generalized anxiety disorder) 08/30/2018   Vitamin D deficiency 01/18/2018   Dyslipidemia associated with type 2 diabetes mellitus ([place]) 11/25/2017   Controlled type 2 diabetes mellitus without complication, without long-term current use of insulin ([place]) 11/10/2017   S/P laparoscopic assisted vaginal  hysterectomy ([facility]) 02/24/2014    PCP: Dimas Chyle   REFERRING PROVIDER: Dimas Chyle  REFERRING DIAG: Neck pain  THERAPY DIAG:  Cervicalgia  Rationale for Evaluation and Treatment: Rehabilitation  ONSET DATE:  January 2024.   SUBJECTIVE:                                                                                                                                                                                      SUBJECTIVE STATEMENT:  Increased pain in neck, in beginning of January, no incident to report. No previous neck pain.  Spasms in R side of neck, into R arm and into fingers, most pain in R UT, tingling into R fingers,  states tingling into fingers, only with spasm in neck ( a few times/day), not constant. She Is taking pain meds for spasms.   She is R handed.  Also states pain in Leg, shooting down into toes, no pain in back.  Feels pain in leg with spasm in her neck. Has had pain in hip before, this pain feels different, tingling in leg with  neck pain.  Reports that She "blacked out" when she was driving, then continued driving to her PCP appointment last week.  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   PERTINENT HISTORY: DM, HTN  PAIN:  pain 8.5 near ear, minimal pain at rest, but increased to 10/10 with spasms in UT. Are you having pain? Yes: NPRS scale: 8-10/10 Pain location: see above Pain description: spasm, pain Aggravating factors: none stated  Relieving factors: heating pain  PRECAUTIONS: None  WEIGHT BEARING RESTRICTIONS: No  FALLS:  Has patient fallen in last 6 months? No  OCCUPATION:  PLOF: Independent  PATIENT GOALS:   NEXT MD VISIT:   OBJECTIVE:   DIAGNOSTIC FINDINGS:    PATIENT SURVEYS :  FOTO: 50.9  COGNITION: Overall cognitive status: Within functional limits for tasks  assessed     SENSATION: WFL  POSTURE:    UPPER EXTREMITY ROM:   Active ROM Right eval Left eval  Shoulder flexion wfl wfl  Shoulder extension    Shoulder abduction    Shoulder adduction    Shoulder internal rotation    Shoulder external rotation        Neck:     Cervical flexion wnl   L rotation  65  R rotation 55               (Blank rows = not tested)  UPPER EXTREMITY MMT:  MMT Right eval Left eval  Shoulder flexion 4 4  Shoulder extension    Shoulder abduction 4 4  Shoulder adduction    Shoulder internal rotation    Shoulder external rotation    Middle trapezius    Lower trapezius    Elbow flexion    Elbow extension    Wrist flexion    Wrist extension    Wrist ulnar deviation    Wrist radial deviation    Wrist pronation    Wrist supination    Grip strength (lbs)    (Blank rows = not tested)  Neck: SPECIAL TESTS:  JOINT MOBILITY TESTING:  WFL  PALPATION:  Most tenderness in R UT, mild tightness/soreness in R Sub occipital Minimal pain in c-spine with PA s or testing today . Pain/tenderness in front of R ear, and below earlobe, possible lymph node swelling- will discuss with MD.    TODAY'S TREATMENT:  DATE:   06/16/22:  Ther ex: See below for HEP.    PATIENT EDUCATION: Education details: PT POC, Exam findings, HEP Person educated: Patient Education method: Explanation, Demonstration, Tactile cues, Verbal cues, and Handouts Education comprehension: verbalized understanding, returned demonstration, verbal cues required, tactile cues required, and needs further education  HOME EXERCISE PROGRAM: Access Code: [email or web address] URL: [email or web address] Date: 06/20/2022 Prepared by: Lyndee Hensen  Exercises - Seated Cervical Sidebending Stretch  - 2 x daily - 3 reps - 30 hold - Seated Scapular  Retraction  - 3 x daily - 1 sets - 10 reps    ASSESSMENT:  CLINICAL IMPRESSION: Patient presents with primary complaint of increased pain in neck. She has increased pain, tenderness, and muscle spasm mostly in R UT region. She has limited ROM for neck due to pain and tightness. Pain appears to be muscular today with testing, minimal pain in c-spine. She also has pain in R LE , she states increased pain in leg, with spasm in neck, will continue to monitor LE/back pain. She also has area of significant tenderness in front of R ear, unsure if lymph node irritation or not, she did just seen PCP yesterday, but will also make him aware of this. Pt to benefit from skilled PT to improve deficits and pain.   OBJECTIVE IMPAIRMENTS: decreased activity tolerance, decreased mobility, decreased ROM, decreased strength, increased muscle spasms, impaired flexibility, impaired UE functional use, improper body mechanics, and pain.   ACTIVITY LIMITATIONS: carrying, lifting, bathing, reach over head, hygiene/grooming, and locomotion level  PARTICIPATION LIMITATIONS: meal prep, cleaning, laundry, driving, shopping, community activity, and occupation  PERSONAL FACTORS:  none  are also affecting patient's functional outcome.   REHAB POTENTIAL: Good  CLINICAL DECISION MAKING: Stable/uncomplicated  EVALUATION COMPLEXITY: Low  GOALS: Goals reviewed with patient? Yes  SHORT TERM GOALS: Target date: 06/30/22  Pt to be independent with initial HEP  Goal status: INITIAL  2.  Pt to report decreased spasms in neck to 0-1x/day.   Goal status: INITIAL    LONG TERM GOALS: Target date: 08/11/22  Pt to be independent with final HEP  Goal status: INITIAL  2.  Pt to demo improved cervical ROM to be WNL, and pain free, to improve ability for driving.   Goal status: INITIAL  3.  Pt to report decreased pain and spasm in Cervical / R shoulder area to be at least 75 % improved.   Goal status:  INITIAL    PLAN: PT FREQUENCY: 1-2x/week  PT DURATION: 8 weeks  PLANNED INTERVENTIONS: Therapeutic exercises, Therapeutic activity, Neuromuscular re-education, Patient/Family education, Self Care, Joint mobilization, Joint manipulation, DME instructions, Dry Needling, Spinal manipulation, Spinal mobilization, Cryotherapy, Moist heat, Taping, Traction, Ultrasound, Ionotophoresis 4mg /ml Dexamethasone, and Manual therapy  PLAN FOR NEXT SESSION:    Lyndee Hensen, PT, DPT 1:29 PM  06/20/22

## 2022-06-16 NOTE — Telephone Encounter (Signed)
Patient stated pharmacy called for Rx Trulicity to be pick up  Patient stated not taking Rx Trulicity since it didn't agreed with her. Please advise

## 2022-06-20 ENCOUNTER — Other Ambulatory Visit: Payer: Self-pay | Admitting: *Deleted

## 2022-06-20 ENCOUNTER — Telehealth: Payer: Self-pay | Admitting: *Deleted

## 2022-06-20 ENCOUNTER — Encounter: Payer: Self-pay | Admitting: Physical Therapy

## 2022-06-20 ENCOUNTER — Ambulatory Visit: Payer: 59 | Admitting: Family Medicine

## 2022-06-20 DIAGNOSIS — M542 Cervicalgia: Secondary | ICD-10-CM

## 2022-06-20 NOTE — Telephone Encounter (Signed)
Spoke wit patient, informed patient [place] was placed today STAT. Referral coordination will call with appt information.  Patient stated mass is increasing, pain medication not helping, only make her sleepy. Please advise

## 2022-06-20 NOTE — Telephone Encounter (Signed)
We need to get her imaging ASAP. Insurance denied her CT scan.  Algis Greenhouse. Jerline Pain, MD 06/20/2022 2:07 PM

## 2022-06-20 NOTE — Telephone Encounter (Signed)
Patient has appt with radiology on 06/21/2022

## 2022-06-21 ENCOUNTER — Ambulatory Visit
Admission: RE | Admit: 2022-06-21 | Discharge: 2022-06-21 | Disposition: A | Discharge status: Home / Self Care | Payer: 59 | Source: Ambulatory Visit | Attending: Family Medicine | Admitting: Family Medicine

## 2022-06-21 ENCOUNTER — Encounter: Payer: 59 | Admitting: Physical Therapy

## 2022-06-21 DIAGNOSIS — M542 Cervicalgia: Secondary | ICD-10-CM

## 2022-06-22 ENCOUNTER — Other Ambulatory Visit: Payer: Self-pay | Admitting: *Deleted

## 2022-06-22 MED ORDER — BYETTA 5 MCG PEN 5 MCG/0.02ML ~~LOC~~ SOPN: 5.0000 ug | PEN_INJECTOR | Freq: Two times a day (BID) | SUBCUTANEOUS | 5 refills | Status: DC

## 2022-06-22 NOTE — Telephone Encounter (Signed)
Spoke with Patient stated was prescribed Rx Byetta 53mcg BID  Did not stared Rx Byetta at this time, want to make sure is daily or BID.  Please advise

## 2022-06-22 NOTE — Telephone Encounter (Signed)
Spoke with Patient stated was prescribed Rx Byetta 5mcg BID  Did not stared Rx Byetta at this time, want to make sure is daily or BID.  Please advise  

## 2022-06-23 ENCOUNTER — Encounter: Payer: Self-pay | Admitting: Physical Therapy

## 2022-06-23 ENCOUNTER — Ambulatory Visit (INDEPENDENT_AMBULATORY_CARE_PROVIDER_SITE_OTHER): Payer: 59 | Admitting: Physical Therapy

## 2022-06-23 DIAGNOSIS — M542 Cervicalgia: Secondary | ICD-10-CM

## 2022-06-23 NOTE — Telephone Encounter (Signed)
Patient notified Byetta BID   Stated CT was denied due to her name was misspell in her insurance card. Ok to resend referral if CT needed

## 2022-06-23 NOTE — Progress Notes (Signed)
Please inform patient of the following:  Her ultrasound shows that she has several enlarged lymph nodes but these appear normal and are likely reactive.  Do not need to do any further testing with this however she should let us know if this is not improving over the next few weeks.  Would like for her to let us know if pain is not improving.

## 2022-06-23 NOTE — Therapy (Signed)
OUTPATIENT PHYSICAL THERAPY UPPER EXTREMITY TREATMENT   Patient Name: RICKESHA VERACRUZ MRN: [id number] DOB:07/29/1975, 47 y.o., female Today's Date: 06/23/2022   END OF SESSION:  PT End of Session - 06/23/22 1221     Visit Number 2    Number of Visits 16    Date for PT Re-Evaluation 08/11/22    Authorization Type Aetna    PT Start Time 1208    PT Stop Time 1250    PT Time Calculation (min) 42 min    Activity Tolerance Patient tolerated treatment well    Behavior During Therapy WFL for tasks assessed/performed              Past Medical History:  Diagnosis Date   Anemia    History   Diabetes mellitus without complication ([place])    diet and exercise controlled - no meds   Hypertension    Obesity    Plantar fasciitis, bilateral    History - recvd cortisone injections bilateral- no current prob as 01/2014   SVD (spontaneous vaginal delivery)    x 2   Past Surgical History:  Procedure Laterality Date   CYSTOSCOPY N/A 02/24/2014   Procedure: CYSTOSCOPY;  Surgeon: Sanjuana Kava, MD;  Location: [facility];  Service: Gynecology;  Laterality: N/A;   DILATION AND CURETTAGE OF UTERUS     MAB   LAPAROSCOPIC ASSISTED VAGINAL HYSTERECTOMY N/A 02/24/2014   Procedure: LAPAROSCOPIC ASSISTED VAGINAL HYSTERECTOMY WITH BILATERAL SALPINGECTOMY ;  Surgeon: Sanjuana Kava, MD;  Location: [facility];  Service: Gynecology;  Laterality: N/A;   TUBAL LIGATION     WISDOM TOOTH EXTRACTION     Patient Active Problem List   Diagnosis Date Noted   Lateral epicondylitis, right elbow 03/15/2022   Probable carpal tunnel syndrome, right 03/15/2022   Hypertension associated with diabetes ([place]) 03/10/2021   GAD (generalized anxiety disorder) 08/30/2018   Vitamin D deficiency 01/18/2018   Dyslipidemia associated with type 2 diabetes mellitus ([place]) 11/25/2017   Controlled type 2 diabetes mellitus without complication, without long-term current use of insulin ([place]) 11/10/2017   S/P laparoscopic assisted vaginal  hysterectomy ([facility]) 02/24/2014    PCP: Dimas Chyle   REFERRING PROVIDER: Dimas Chyle  REFERRING DIAG: Neck pain  THERAPY DIAG:  Cervicalgia  Rationale for Evaluation and Treatment: Rehabilitation  ONSET DATE:  January 2024.   SUBJECTIVE:                                                                                                                                                                                      SUBJECTIVE STATEMENT: Pt states neck still very painful, mores spasms in last few days. He did have an  [place] for head/neck, unsure of results yet. Is still getting tingling into Arm (and leg) but only at time of spasm in neck- intermittent   Eval: Increased pain in neck, in beginning of January, no incident to report. No previous neck pain.  Spasms in R side of neck, into R arm and into fingers, most pain in R UT, tingling into R fingers,  states tingling into fingers, only with spasm in neck ( a few times/day), not constant. She Is taking pain meds for spasms.   She is R handed.  Also states pain in Leg, shooting down into toes, no pain in back.  Feels pain in leg with spasm in her neck. Has had pain in hip before, this pain feels different, tingling in leg with  neck pain.  Reports that She "blacked out" when she was driving, then continued driving to her PCP appointment last week.  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   PERTINENT HISTORY: DM, HTN  PAIN:  pain 8.5 near ear, minimal pain at rest, but increased to 10/10 with spasms in UT. Are you having pain? Yes: NPRS scale: 8-10/10 Pain location: see above Pain description: spasm, pain Aggravating factors: none stated  Relieving factors: heating pain  PRECAUTIONS: None  WEIGHT BEARING RESTRICTIONS: No  FALLS:  Has patient fallen in last 6 months? No  OCCUPATION:  PLOF:  Independent  PATIENT GOALS:   NEXT MD VISIT:   OBJECTIVE:   DIAGNOSTIC FINDINGS:    PATIENT SURVEYS :  FOTO: 50.9  COGNITION: Overall cognitive status: Within functional limits for tasks assessed     SENSATION: WFL  POSTURE:    UPPER EXTREMITY ROM:   Active ROM Right eval Left eval  Shoulder flexion wfl wfl  Shoulder extension    Shoulder abduction    Shoulder adduction    Shoulder internal rotation    Shoulder external rotation        Neck:     Cervical flexion wnl   L rotation  65  R rotation 55               (Blank rows = not tested)  UPPER EXTREMITY MMT:  MMT Right eval Left eval  Shoulder flexion 4 4  Shoulder extension    Shoulder abduction 4 4  Shoulder adduction    Shoulder internal rotation    Shoulder external rotation    Middle trapezius    Lower trapezius    Elbow flexion    Elbow extension    Wrist flexion    Wrist extension    Wrist ulnar deviation    Wrist radial deviation    Wrist pronation    Wrist supination    Grip strength (lbs)    (Blank rows = not tested)  Neck: SPECIAL TESTS:  JOINT MOBILITY TESTING:  WFL  PALPATION:  Most tenderness in R UT, mild tightness/soreness in R Sub occipital Minimal pain in c-spine with PA s or testing today . Pain/tenderness in front of R ear, and below earlobe, possible lymph node swelling- will discuss with MD.    TODAY'S TREATMENT:  DATE:   06/23/22: Therapeutic Exercise: Aerobic: Supine:  Neck ROM/rotation x 5 bil;  R UT stretch 20 sec x 4 ;  very light chin tucks x 10;  Seated: Scap squeeze x 15; shoulder rolls x 15; cervical flexion rom  x 5;  Standing:  Stretches:  Neuromuscular Re-education: Manual Therapy: Light STM to R UT, levator, bil Sub occipitals, cervical paraspinals; SOR;    PATIENT EDUCATION: Education details: reviewed HEP,  discussed decompression position, pillow, towel roll for neck pain, will do more in supine position for comfort.  Person educated: Patient Education method: Explanation, Demonstration, Tactile cues, Verbal cues, and Handouts Education comprehension: verbalized understanding, returned demonstration, verbal cues required, tactile cues required, and needs further education  HOME EXERCISE PROGRAM: Access Code: [email or web address]   ASSESSMENT:  CLINICAL IMPRESSION: Pt with improved comfort in supine position for neck ROM today. Limited rotation and pain to the L in seated position. Area in front of and below ear on R still quite tender- will wait for [place] results. Pt with muscle tension in Uts and SO region today, focus for manual to relieve pain and education on positioning for home.   Eval: Patient presents with primary complaint of increased pain in neck. She has increased pain, tenderness, and muscle spasm mostly in R UT region. She has limited ROM for neck due to pain and tightness. Pain appears to be muscular today with testing, minimal pain in c-spine. She also has pain in R LE , she states increased pain in leg, with spasm in neck, will continue to monitor LE/back pain. She also has area of significant tenderness in front of R ear, unsure if lymph node irritation or not, she did just seen PCP yesterday, but will also make him aware of this. Pt to benefit from skilled PT to improve deficits and pain.   OBJECTIVE IMPAIRMENTS: decreased activity tolerance, decreased mobility, decreased ROM, decreased strength, increased muscle spasms, impaired flexibility, impaired UE functional use, improper body mechanics, and pain.   ACTIVITY LIMITATIONS: carrying, lifting, bathing, reach over head, hygiene/grooming, and locomotion level  PARTICIPATION LIMITATIONS: meal prep, cleaning, laundry, driving, shopping, community activity, and occupation  PERSONAL FACTORS:  none  are also affecting patient's functional  outcome.   REHAB POTENTIAL: Good  CLINICAL DECISION MAKING: Stable/uncomplicated  EVALUATION COMPLEXITY: Low  GOALS: Goals reviewed with patient? Yes  SHORT TERM GOALS: Target date: 06/30/22  Pt to be independent with initial HEP  Goal status: INITIAL  2.  Pt to report decreased spasms in neck to 0-1x/day.   Goal status: INITIAL    LONG TERM GOALS: Target date: 08/11/22  Pt to be independent with final HEP  Goal status: INITIAL  2.  Pt to demo improved cervical ROM to be WNL, and pain free, to improve ability for driving.   Goal status: INITIAL  3.  Pt to report decreased pain and spasm in Cervical / R shoulder area to be at least 75 % improved.   Goal status: INITIAL    PLAN: PT FREQUENCY: 1-2x/week  PT DURATION: 8 weeks  PLANNED INTERVENTIONS: Therapeutic exercises, Therapeutic activity, Neuromuscular re-education, Patient/Family education, Self Care, Joint mobilization, Joint manipulation, DME instructions, Dry Needling, Spinal manipulation, Spinal mobilization, Cryotherapy, Moist heat, Taping, Traction, Ultrasound, Ionotophoresis 4mg /ml Dexamethasone, and Manual therapy  PLAN FOR NEXT SESSION:    Lyndee Hensen, PT, DPT 9:26 PM  06/23/22

## 2022-06-23 NOTE — Telephone Encounter (Signed)
If she is still having pain then yes we should order her CT scan again.  Algis Greenhouse. Jerline Pain, MD 06/23/2022 1:04 PM

## 2022-06-23 NOTE — Telephone Encounter (Signed)
Yes it should be twice daily.  Algis Greenhouse. Jerline Pain, MD 06/23/2022 7:59 AM

## 2022-06-24 ENCOUNTER — Encounter: Payer: Self-pay | Admitting: Family Medicine

## 2022-06-24 ENCOUNTER — Other Ambulatory Visit: Payer: Self-pay | Admitting: Family Medicine

## 2022-06-24 ENCOUNTER — Other Ambulatory Visit: Payer: Self-pay | Admitting: *Deleted

## 2022-06-24 ENCOUNTER — Telehealth: Payer: Self-pay | Admitting: *Deleted

## 2022-06-24 DIAGNOSIS — M542 Cervicalgia: Secondary | ICD-10-CM

## 2022-06-24 NOTE — Telephone Encounter (Signed)
See her result note. Recommend CT scan if she still having pain.  Algis Greenhouse. Jerline Pain, MD 06/24/2022 9:50 AM

## 2022-06-24 NOTE — Telephone Encounter (Signed)
See note

## 2022-06-24 NOTE — Telephone Encounter (Signed)
FMLA form done, patient notified  Copy placed to be scan, original placed at front office to be pickup

## 2022-06-27 NOTE — Telephone Encounter (Signed)
CT order and schedule

## 2022-06-28 ENCOUNTER — Encounter: Payer: 59 | Admitting: Physical Therapy

## 2022-06-29 ENCOUNTER — Telehealth: Payer: Self-pay | Admitting: *Deleted

## 2022-06-29 NOTE — Telephone Encounter (Signed)
(  Key: [id number]) - [id number] Trulicity 0.75MG /0.5ML pen-injectors Status: PA Request Sent: February 7th, 2024 Waiting for determination

## 2022-06-30 ENCOUNTER — Encounter: Payer: Self-pay | Admitting: Physical Therapy

## 2022-06-30 ENCOUNTER — Ambulatory Visit (INDEPENDENT_AMBULATORY_CARE_PROVIDER_SITE_OTHER): Payer: 59 | Admitting: Physical Therapy

## 2022-06-30 DIAGNOSIS — M542 Cervicalgia: Secondary | ICD-10-CM

## 2022-06-30 NOTE — Therapy (Addendum)
OUTPATIENT PHYSICAL THERAPY UPPER EXTREMITY TREATMENT   Patient Name: Renee Knapp MRN: [id number] DOB:[birth year], 47 y.o., female Today's Date: 06/30/2022   END OF SESSION:  PT End of Session - 06/30/22 1221     Visit Number 3    Number of Visits 16    Date for PT Re-Evaluation 08/11/22    Authorization Type [employer]    PT Start Time 1220    PT Stop Time 1300    PT Time Calculation (min) 40 min    Activity Tolerance Patient tolerated treatment well    Behavior During Therapy WFL for tasks assessed/performed              Past Medical History:  Diagnosis Date   Anemia    History   Diabetes mellitus without complication (HCC)    diet and exercise controlled - no meds   Hypertension    Obesity    Plantar fasciitis, bilateral    History - recvd cortisone injections bilateral- no current prob as 01/2014   SVD (spontaneous vaginal delivery)    x 2   Past Surgical History:  Procedure Laterality Date   CYSTOSCOPY N/A 02/24/2014   Procedure: CYSTOSCOPY;  Surgeon: Essie Hart, MD;  Location: [facility];  Service: Gynecology;  Laterality: N/A;   DILATION AND CURETTAGE OF UTERUS     MAB   LAPAROSCOPIC ASSISTED VAGINAL HYSTERECTOMY N/A 02/24/2014   Procedure: LAPAROSCOPIC ASSISTED VAGINAL HYSTERECTOMY WITH BILATERAL SALPINGECTOMY ;  Surgeon: Essie Hart, MD;  Location: [facility];  Service: Gynecology;  Laterality: N/A;   TUBAL LIGATION     WISDOM TOOTH EXTRACTION     Patient Active Problem List   Diagnosis Date Noted   Lateral epicondylitis, right elbow 03/15/2022   Probable carpal tunnel syndrome, right 03/15/2022   Hypertension associated with diabetes (HCC) 03/10/2021   GAD (generalized anxiety disorder) 08/30/2018   Vitamin D deficiency 01/18/2018   Dyslipidemia associated with type 2 diabetes mellitus (HCC) 11/25/2017   Controlled type 2 diabetes mellitus without complication, without long-term current use of insulin (HCC) 11/10/2017   S/P laparoscopic assisted vaginal  hysterectomy (LAVH) 02/24/2014    PCP: Jacquiline Doe   REFERRING PROVIDER: Jacquiline Doe  REFERRING DIAG: Neck pain  THERAPY DIAG:  Cervicalgia  Rationale for Evaluation and Treatment: Rehabilitation  ONSET DATE:  January 2024.   SUBJECTIVE:                                                                                                                                                                                      SUBJECTIVE STATEMENT: Pt states neck doing a bit better, about 2-4 spasms/day. Has been using heat. Still having  pain in arm with spasms.   Eval: Increased pain in neck, in beginning of January, no incident to report. No previous neck pain.  Spasms in R side of neck, into R arm and into fingers, most pain in R UT, tingling into R fingers,  states tingling into fingers, only with spasm in neck ( a few times/day), not constant. She Is taking pain meds for spasms.   She is R handed.  Also states pain in Leg, shooting down into toes, no pain in back.  Feels pain in leg with spasm in her neck. Has had pain in hip before, this pain feels different, tingling in leg with  neck pain.  Reports that She "blacked out" when she was driving, then continued driving to her PCP appointment last week.  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   PERTINENT HISTORY: DM, HTN  PAIN:  pain 8.5 near ear, minimal pain at rest, but increased to 10/10 with spasms in UT. Are you having pain? Yes: NPRS scale: 8-10/10 Pain location: see above Pain description: spasm, pain Aggravating factors: none stated  Relieving factors: heating pain  PRECAUTIONS: None  WEIGHT BEARING RESTRICTIONS: No  FALLS:  Has patient fallen in last 6 months? No  OCCUPATION:  PLOF: Independent  PATIENT GOALS:   NEXT MD VISIT:   OBJECTIVE:   DIAGNOSTIC FINDINGS:    PATIENT SURVEYS :   FOTO: 50.9  COGNITION: Overall cognitive status: Within functional limits for tasks assessed     SENSATION: WFL  POSTURE:    UPPER EXTREMITY ROM:   Active ROM Right eval Left eval  Shoulder flexion wfl wfl  Shoulder extension    Shoulder abduction    Shoulder adduction    Shoulder internal rotation    Shoulder external rotation        Neck:     Cervical flexion wnl   L rotation  65  R rotation 55               (Blank rows = not tested)  UPPER EXTREMITY MMT:  MMT Right eval Left eval  Shoulder flexion 4 4  Shoulder extension    Shoulder abduction 4 4  Shoulder adduction    Shoulder internal rotation    Shoulder external rotation    Middle trapezius    Lower trapezius    Elbow flexion    Elbow extension    Wrist flexion    Wrist extension    Wrist ulnar deviation    Wrist radial deviation    Wrist pronation    Wrist supination    Grip strength (lbs)    (Blank rows = not tested)  Neck: SPECIAL TESTS:  JOINT MOBILITY TESTING:  WFL  PALPATION:  Most tenderness in R UT, mild tightness/soreness in R Sub occipital Minimal pain in c-spine with PA s or testing today . Pain/tenderness in front of R ear, and below earlobe, possible lymph node swelling- will discuss with MD.    TODAY'S TREATMENT:  DATE:   06/30/22: Therapeutic Exercise: Aerobic: Supine:  Neck ROM/rotation x 5 bil;  R UT stretch 20 sec x 4 ;  very light chin tucks x 10;  Scap squeeze arms by sides x10 Seated: Scap squeeze x 15;: Shoulder ER YTB 2 x 10; Scap squeeze x 15;  Stretches:  Neuromuscular Re-education: Manual Therapy: STM to R UT, levator, bil Sub occipitals, cervical paraspinals; SOR; Light cervical distractoin   06/23/22: Therapeutic Exercise: Aerobic: Supine:  Neck ROM/rotation x 5 bil;  R UT stretch 20 sec x 4 ;  very light chin tucks x  10;  Seated: Scap squeeze x 15; shoulder rolls x 15; cervical flexion rom  x 5;  Standing:  Stretches:  Neuromuscular Re-education: Manual Therapy: Light STM to R UT, levator, bil Sub occipitals, cervical paraspinals; SOR;    PATIENT EDUCATION: Education details: reviewed HEP, discussed decompression position, pillow, towel roll for neck pain, will do more in supine position for comfort.  Person educated: Patient Education method: Explanation, Demonstration, Tactile cues, Verbal cues, and Handouts Education comprehension: verbalized understanding, returned demonstration, verbal cues required, tactile cues required, and needs further education  HOME EXERCISE PROGRAM: Access Code: [email or web address]   ASSESSMENT:  CLINICAL IMPRESSION: Pt with less pain overall today. She has improved ROM for cervical rotation in both directions with less pain. She has good tolerance for manual, still with increased tightness in musculature. Plan to progress as tolerated.   Eval: Patient presents with primary complaint of increased pain in neck. She has increased pain, tenderness, and muscle spasm mostly in R UT region. She has limited ROM for neck due to pain and tightness. Pain appears to be muscular today with testing, minimal pain in c-spine. She also has pain in R LE , she states increased pain in leg, with spasm in neck, will continue to monitor LE/back pain. She also has area of significant tenderness in front of R ear, unsure if lymph node irritation or not, she did just seen PCP yesterday, but will also make him aware of this. Pt to benefit from skilled PT to improve deficits and pain.   OBJECTIVE IMPAIRMENTS: decreased activity tolerance, decreased mobility, decreased ROM, decreased strength, increased muscle spasms, impaired flexibility, impaired UE functional use, improper body mechanics, and pain.   ACTIVITY LIMITATIONS: carrying, lifting, bathing, reach over head, hygiene/grooming, and locomotion  level  PARTICIPATION LIMITATIONS: meal prep, cleaning, laundry, driving, shopping, community activity, and occupation  PERSONAL FACTORS:  none  are also affecting patient's functional outcome.   REHAB POTENTIAL: Good  CLINICAL DECISION MAKING: Stable/uncomplicated  EVALUATION COMPLEXITY: Low  GOALS: Goals reviewed with patient? Yes  SHORT TERM GOALS: Target date: 06/30/22  Pt to be independent with initial HEP  Goal status: INITIAL  2.  Pt to report decreased spasms in neck to 0-1x/day.   Goal status: INITIAL    LONG TERM GOALS: Target date: 08/11/22  Pt to be independent with final HEP  Goal status: INITIAL  2.  Pt to demo improved cervical ROM to be WNL, and pain free, to improve ability for driving.   Goal status: INITIAL  3.  Pt to report decreased pain and spasm in Cervical / R shoulder area to be at least 75 % improved.   Goal status: INITIAL    PLAN: PT FREQUENCY: 1-2x/week  PT DURATION: 8 weeks  PLANNED INTERVENTIONS: Therapeutic exercises, Therapeutic activity, Neuromuscular re-education, Patient/Family education, Self Care, Joint mobilization, Joint manipulation, DME instructions, Dry Needling, Spinal manipulation, Spinal mobilization, Cryotherapy, Moist heat,  Taping, Traction, Ultrasound, Ionotophoresis 4mg /ml Dexamethasone, and Manual therapy  PLAN FOR NEXT SESSION:    Sedalia Muta, PT, DPT 12:22 PM  06/30/22  PHYSICAL THERAPY DISCHARGE SUMMARY  Visits from Start of Care: 3   Plan: Patient agrees to discharge.  Patient goals were partially met. Patient is being discharged due to - not returning after visit 3.      Sedalia Muta, PT, DPT 12:04 PM  12/19/22

## 2022-06-30 NOTE — Therapy (Signed)
  OUTPATIENT PHYSICAL THERAPY NOTE   Patient Name: Renee Knapp MRN: [id number] DOB:1976/05/16, 48 y.o., female Today's Date: 06/16/2022    REFERRING PROVIDER: Dimas Chyle  REFERRING DIAG: Neck pain  THERAPY DIAG:  Cervicalgia  Rationale for Evaluation and Treatment: Rehabilitation   PLAN: PT FREQUENCY: 1-2x/week  PT DURATION: 8 weeks     This patient was seen for PT evaluation on 06/16/22. The recommended time frame for treatment will be 8 weeks, through 08/11/22. Further time may be needed as progress is assessed later on.   Lyndee Hensen, PT, DPT   12:59 PM  06/30/22

## 2022-07-05 ENCOUNTER — Encounter: Payer: 59 | Admitting: Physical Therapy

## 2022-07-08 ENCOUNTER — Ambulatory Visit (HOSPITAL_BASED_OUTPATIENT_CLINIC_OR_DEPARTMENT_OTHER): Payer: 59

## 2022-07-08 ENCOUNTER — Encounter: Payer: 59 | Admitting: Physical Therapy

## 2022-07-12 ENCOUNTER — Encounter: Payer: 59 | Admitting: Physical Therapy

## 2022-07-14 ENCOUNTER — Encounter: Payer: 59 | Admitting: Physical Therapy

## 2022-07-15 ENCOUNTER — Encounter: Payer: 59 | Admitting: Physical Therapy

## 2022-07-19 NOTE — Telephone Encounter (Signed)
PA approved As long as you remain covered by your prescription drug plan and there are no changes to your plan benefits, this request is approved from 07/19/2022 to 07/20/2023. Patient notified/Pharmacy notified

## 2022-08-09 ENCOUNTER — Encounter (HOSPITAL_COMMUNITY): Payer: Self-pay

## 2022-08-09 ENCOUNTER — Ambulatory Visit (HOSPITAL_COMMUNITY)
Admission: EM | Admit: 2022-08-09 | Discharge: 2022-08-09 | Disposition: A | Discharge status: Home / Self Care | Payer: 59 | Attending: Family Medicine | Admitting: Family Medicine

## 2022-08-09 ENCOUNTER — Ambulatory Visit (INDEPENDENT_AMBULATORY_CARE_PROVIDER_SITE_OTHER): Payer: 59

## 2022-08-09 DIAGNOSIS — M7989 Other specified soft tissue disorders: Secondary | ICD-10-CM | POA: Diagnosis not present

## 2022-08-09 DIAGNOSIS — M79671 Pain in right foot: Secondary | ICD-10-CM

## 2022-08-09 NOTE — Discharge Instructions (Addendum)
You were seen today for foot pain.  Your xray did not show any cause for your pain.  I have given you a boot to wear for comfort.  You may use tylenol/motrin for pain as needed.  I recommend you follow up with [facility].  I have placed this referral but you may call for an appointment at [phone].

## 2022-08-09 NOTE — ED Provider Notes (Signed)
[place]    CSN: [id number] Arrival date & time: 08/09/22  1403      History   Chief Complaint Chief Complaint  Patient presents with   Foot Pain    HPI Renee Knapp is a 47 y.o. female.   Patient is here for right foot pain.  Woke up several days ago with pain.  Pain is constant, staying the same.  This has not happened before.  She does have a h/o gout and this does not feel like that either.  She thinks it is swollen.  Taking otc meds without much help.  Did not do a lot of walking the day prior.  No injury noted.        Past Medical History:  Diagnosis Date   Anemia    History   Diabetes mellitus without complication ([place])    diet and exercise controlled - no meds   Hypertension    Obesity    Plantar fasciitis, bilateral    History - recvd cortisone injections bilateral- no current prob as 01/2014   SVD (spontaneous vaginal delivery)    x 2    Patient Active Problem List   Diagnosis Date Noted   Lateral epicondylitis, right elbow 03/15/2022   Probable carpal tunnel syndrome, right 03/15/2022   Hypertension associated with diabetes ([place]) 03/10/2021   GAD (generalized anxiety disorder) 08/30/2018   Vitamin D deficiency 01/18/2018   Dyslipidemia associated with type 2 diabetes mellitus ([place]) 11/25/2017   Controlled type 2 diabetes mellitus without complication, without long-term current use of insulin ([place]) 11/10/2017   S/P laparoscopic assisted vaginal hysterectomy (LAVH) 02/24/2014    Past Surgical History:  Procedure Laterality Date   CYSTOSCOPY N/A 02/24/2014   Procedure: CYSTOSCOPY;  Surgeon: Sanjuana Kava, MD;  Location: [facility];  Service: Gynecology;  Laterality: N/A;   DILATION AND CURETTAGE OF UTERUS     MAB   LAPAROSCOPIC ASSISTED VAGINAL HYSTERECTOMY N/A 02/24/2014   Procedure: LAPAROSCOPIC ASSISTED VAGINAL HYSTERECTOMY WITH BILATERAL SALPINGECTOMY ;  Surgeon: Sanjuana Kava, MD;  Location: [facility];  Service: Gynecology;  Laterality:  N/A;   TUBAL LIGATION     WISDOM TOOTH EXTRACTION      OB History     Gravida  3   Para      Term      Preterm      AB  1   Living  2      SAB  1   IAB      Ectopic      Multiple      Live Births               Home Medications    Prior to Admission medications   Medication Sig Start Date End Date Taking? Authorizing Provider  albuterol (VENTOLIN HFA) 108 (90 Base) MCG/ACT inhaler Inhale 1-2 puffs into the lungs every 6 (six) hours as needed for wheezing or shortness of breath. 05/12/22   Raspet, Derry Skill, PA-C  Bayer Microlet Lancets lancets USE 1 SINGLE USE LANCETS AS NEEDED TO CHECK BLOOD SUGAR 04/24/19   Orma Flaming, MD  CONTOUR NEXT TEST test strip USE 1 LANCETS AS NEEDED TO CHECK BLOOD SUGAR 03/30/20   Orma Flaming, MD  cyclobenzaprine (FLEXERIL) 10 MG tablet Take 1 tablet (10 mg total) by mouth 3 (three) times daily as needed for muscle spasms. 06/14/22   Vivi Barrack, MD  diclofenac (VOLTAREN) 75 MG EC tablet Take 1 tablet (75 mg total) by mouth  2 (two) times daily. 06/14/22   Vivi Barrack, MD  EPINEPHrine (EPIPEN 2-PAK) 0.3 mg/0.3 mL IJ SOAJ injection Inject 0.3 mg into the muscle as needed for anaphylaxis. 06/14/22   Vivi Barrack, MD  escitalopram (LEXAPRO) 5 MG tablet Take 1 tablet (5 mg total) by mouth daily. 04/27/22   Vivi Barrack, MD  exenatide (BYETTA 5 MCG PEN) 5 MCG/0.02ML SOPN injection Inject 5 mcg into the skin 2 (two) times daily with a meal. 06/22/22   Vivi Barrack, MD  hydrOXYzine (ATARAX) 25 MG tablet Take 1 tablet (25 mg total) by mouth every 6 (six) hours. 10/09/21   Blue, Soijett A, PA-C  ibuprofen (ADVIL) 800 MG tablet TAKE 1 TABLET BY MOUTH EVERY 8 HOURS AS NEEDED 04/27/22   Salvadore Dom, MD  Insulin Pen Needle 31G X 5 MM MISC Use to inject insulin bid 06/07/22   Vivi Barrack, MD  ondansetron (ZOFRAN) 4 MG tablet Take 1 tablet (4 mg total) by mouth every 8 (eight) hours as needed for nausea or vomiting. 06/14/22    Vivi Barrack, MD  rosuvastatin (CRESTOR) 10 MG tablet Take 1 tablet (10 mg total) by mouth daily. 10/12/20   Orma Flaming, MD    Family History Family History  Problem Relation Age of Onset   Diabetes Mother    Heart disease Mother    Hypertension Mother    Hypertension Father    High Cholesterol Father    Hypertension Sister    Kidney disease Sister    Stroke Sister    Hypertension Sister    Breast cancer Maternal Aunt    Hearing loss Son    Cancer Other    Hypertension Other    Diabetes Other     Social History Social History   Tobacco Use   Smoking status: Never   Smokeless tobacco: Never  Vaping Use   Vaping Use: Never used  Substance Use Topics   Alcohol use: No   Drug use: No     Allergies   Bee venom and Reglan [metoclopramide]   Review of Systems Review of Systems  Constitutional: Negative.   HENT: Negative.    Cardiovascular: Negative.   Gastrointestinal: Negative.   Musculoskeletal:  Positive for gait problem and joint swelling.     Physical Exam Triage Vital Signs ED Triage Vitals [08/09/22 1444]  Enc Vitals Group     BP 128/84     Pulse Rate 82     Resp 16     Temp 98.3 F (36.8 C)     Temp Source Oral     SpO2 96 %     Weight      Height      Head Circumference      Peak Flow      Pain Score 10     Pain Loc      Pain Edu?      Excl. in [place]?    No data found.  Updated Vital Signs BP 128/84 (BP Location: Left Arm)   Pulse 82   Temp 98.3 F (36.8 C) (Oral)   Resp 16   LMP 02/06/2014 (LMP Unknown)   SpO2 96%   Visual Acuity Right Eye Distance:   Left Eye Distance:   Bilateral Distance:    Right Eye Near:   Left Eye Near:    Bilateral Near:     Physical Exam Constitutional:      Appearance: Normal appearance.  Musculoskeletal:  Comments: There is slight swelling to the top of the foot, just distal to the ankle;  no redness or warmth is noted;  She has TTP to the 2nd-4th metatarsals proximal rather than  distal;  full rom but pain with dorsi/plantar flexion  Skin:    General: Skin is warm.  Neurological:     General: No focal deficit present.     Mental Status: She is alert.  Psychiatric:        Mood and Affect: Mood normal.      UC Treatments / Results  Labs (all labs ordered are listed, but only abnormal results are displayed) Labs Reviewed - No data to display  EKG   Radiology DG Foot Complete Right  Result Date: 08/09/2022 CLINICAL DATA:  Acute right foot pain and swelling for 3 days without known injury. EXAM: RIGHT FOOT COMPLETE - 3+ VIEW COMPARISON:  None Available. FINDINGS: There is a moderate size bone spur arising laterally from proximal base of fifth proximal phalanx which may have a minimally displaced fracture of indeterminate age. There is no evidence of arthropathy or other focal bone abnormality. Soft tissues are unremarkable. IMPRESSION: Possible minimally displaced fracture of indeterminate age involving moderate sized bone spur arising laterally from proximal base of fifth proximal phalanx. Electronically Signed   By: Marijo Conception M.D.   On: 08/09/2022 15:38    Procedures Procedures (including critical care time)  Medications Ordered in UC Medications - No data to display  Initial Impression / Assessment and Plan / UC Course  I have reviewed the triage vital signs and the nursing notes.  Pertinent labs & imaging results that were available during my care of the patient were reviewed by me and considered in my medical decision making (see chart for details).   Final Clinical Impressions(s) / UC Diagnoses   Final diagnoses:  Foot pain, right     Discharge Instructions      You were seen today for foot pain.  Your xray did not show any cause for your pain.  I have given you a boot to wear for comfort.  You may use tylenol/motrin for pain as needed.  I recommend you follow up with [facility].  I have placed this referral but you may call  for an appointment at [phone].      ED Prescriptions   None    PDMP not reviewed this encounter.   Rondel Oh, MD 08/09/22 [phone]

## 2022-08-09 NOTE — ED Triage Notes (Signed)
Pt states right foot pain for the past 3 days denies injury.  States she has been taking Tylenol and Motrin at home with no relief.  Pt in [facility] states she cannot walk due to the pain.

## 2022-11-09 ENCOUNTER — Emergency Department (HOSPITAL_BASED_OUTPATIENT_CLINIC_OR_DEPARTMENT_OTHER): Payer: Medicaid Other

## 2022-11-09 ENCOUNTER — Encounter (HOSPITAL_BASED_OUTPATIENT_CLINIC_OR_DEPARTMENT_OTHER): Payer: Self-pay | Admitting: Emergency Medicine

## 2022-11-09 ENCOUNTER — Emergency Department (HOSPITAL_BASED_OUTPATIENT_CLINIC_OR_DEPARTMENT_OTHER)
Admission: EM | Admit: 2022-11-09 | Discharge: 2022-11-09 | Disposition: A | Discharge status: Home / Self Care | Payer: Medicaid Other | Attending: Emergency Medicine | Admitting: Emergency Medicine

## 2022-11-09 ENCOUNTER — Other Ambulatory Visit: Payer: Self-pay

## 2022-11-09 DIAGNOSIS — E119 Type 2 diabetes mellitus without complications: Secondary | ICD-10-CM | POA: Insufficient documentation

## 2022-11-09 DIAGNOSIS — Z794 Long term (current) use of insulin: Secondary | ICD-10-CM | POA: Diagnosis not present

## 2022-11-09 DIAGNOSIS — I10 Essential (primary) hypertension: Secondary | ICD-10-CM | POA: Diagnosis not present

## 2022-11-09 DIAGNOSIS — R1031 Right lower quadrant pain: Secondary | ICD-10-CM | POA: Insufficient documentation

## 2022-11-09 DIAGNOSIS — Z79899 Other long term (current) drug therapy: Secondary | ICD-10-CM | POA: Insufficient documentation

## 2022-11-09 LAB — COMPREHENSIVE METABOLIC PANEL
ALT: 16 U/L (ref 0–44)
AST: 16 U/L (ref 15–41)
Albumin: 4.2 g/dL (ref 3.5–5.0)
Alkaline Phosphatase: 89 U/L (ref 38–126)
Anion gap: 8 (ref 5–15)
BUN: 13 mg/dL (ref 6–20)
CO2: 28 mmol/L (ref 22–32)
Calcium: 9.1 mg/dL (ref 8.9–10.3)
Chloride: 101 mmol/L (ref 98–111)
Creatinine, Ser: 0.74 mg/dL (ref 0.44–1.00)
GFR, Estimated: >60 mL/min (ref >60)
Glucose, Bld: 193 mg/dL — ABNORMAL HIGH (ref 70–99)
Potassium: 4 mmol/L (ref 3.5–5.1)
Sodium: 137 mmol/L (ref 135–145)
Total Bilirubin: 0.4 mg/dL (ref 0.3–1.2)
Total Protein: 7.7 g/dL (ref 6.5–8.1)

## 2022-11-09 LAB — URINALYSIS, ROUTINE W REFLEX MICROSCOPIC
Bacteria, UA: NONE SEEN
Bilirubin Urine: NEGATIVE
Glucose, UA: NEGATIVE mg/dL
Ketones, ur: NEGATIVE mg/dL
Leukocytes,Ua: NEGATIVE
Nitrite: NEGATIVE
Protein, ur: NEGATIVE mg/dL
Specific Gravity, Urine: 1.012 (ref 1.005–1.030)
pH: 6 (ref 5.0–8.0)

## 2022-11-09 LAB — CBC
HCT: 41.7 % (ref 36.0–46.0)
Hemoglobin: 13.3 g/dL (ref 12.0–15.0)
MCH: 26.3 pg (ref 26.0–34.0)
MCHC: 31.9 g/dL (ref 30.0–36.0)
MCV: 82.4 fL (ref 80.0–100.0)
Platelets: 298 10*3/uL (ref 150–400)
RBC: 5.06 MIL/uL (ref 3.87–5.11)
RDW: 13.4 % (ref 11.5–15.5)
WBC: 8.3 10*3/uL (ref 4.0–10.5)
nRBC: 0 % (ref 0.0–0.2)

## 2022-11-09 LAB — LIPASE, BLOOD: Lipase: 37 U/L (ref 11–51)

## 2022-11-09 MED ORDER — ONDANSETRON HCL 4 MG/2ML IJ SOLN
4.0000 mg | Freq: Once | INTRAMUSCULAR | Status: AC
  Administered 2022-11-09: 4 mg via INTRAVENOUS
  Filled 2022-11-09: qty 2

## 2022-11-09 MED ORDER — IOHEXOL 300 MG/ML  SOLN
100.0000 mL | Freq: Once | INTRAMUSCULAR | Status: AC | PRN
  Administered 2022-11-09: 85 mL via INTRAVENOUS

## 2022-11-09 MED ORDER — OXYCODONE-ACETAMINOPHEN 5-325 MG PO TABS
1.0000 | ORAL_TABLET | Freq: Once | ORAL | Status: AC
  Administered 2022-11-09: 1 via ORAL
  Filled 2022-11-09: qty 1

## 2022-11-09 MED ORDER — SODIUM CHLORIDE 0.9 % IV BOLUS
1000.0000 mL | Freq: Once | INTRAVENOUS | Status: AC
  Administered 2022-11-09: 1000 mL via INTRAVENOUS

## 2022-11-09 MED ORDER — ONDANSETRON HCL 4 MG PO TABS: 4.0000 mg | ORAL_TABLET | Freq: Three times a day (TID) | ORAL | 0 refills | Status: DC | PRN

## 2022-11-09 MED ORDER — MORPHINE SULFATE (PF) 4 MG/ML IV SOLN
4.0000 mg | Freq: Once | INTRAVENOUS | Status: AC
  Administered 2022-11-09: 4 mg via INTRAVENOUS
  Filled 2022-11-09: qty 1

## 2022-11-09 NOTE — ED Triage Notes (Signed)
Pt arrives to ED with c/o RLQ abdominal pain that started yesterday. She notes diarrhea yesterday prior to the pain. Associated with urinary frequency.

## 2022-11-09 NOTE — ED Provider Notes (Signed)
[place] [facility]GENC[facility] DE[facility]MENT AT [facility] Provider Note   CSN: [id number] Arrival date & time: 11/09/22  1148     History  Chief Complaint  Patient presents with   Abdominal Pain    Renee Knapp is a 47 y.o. female with a past medical history of DM, HTN, anemia, who presents emergency department concerns for right lower quadrant abdominal pain onset yesterday.  Has associated watery nonbloody diarrhea yesterday (resolved today), notes vomiting yesterday, resolved), nausea.  Has tried ibuprofen at home.  Denies dysuria, hematuria, fever, constipation.  Patient still has her gallbladder and appendix.  The history is provided by the patient. No language interpreter was used.       Home Medications Prior to Admission medications   Medication Sig Start Date End Date Taking? Authorizing Provider  albuterol (VENTOLIN HFA) 108 (90 Base) MCG/ACT inhaler Inhale 1-2 puffs into the lungs every 6 (six) hours as needed for wheezing or shortness of breath. 05/12/22   Raspet, Noberto Retort, PA-C  Bayer Microlet Lancets lancets USE 1 SINGLE USE LANCETS AS NEEDED TO CHECK BLOOD SUGAR 04/24/19   [name], MD  CONTOUR NEXT TEST test strip USE 1 LANCETS AS NEEDED TO CHECK BLOOD SUGAR 03/30/20   [name], MD  cyclobenzaprine (FLEXERIL) 10 MG tablet Take 1 tablet (10 mg total) by mouth 3 (three) times daily as needed for muscle spasms. 06/14/22   Ardith Dark, MD  diclofenac (VOLTAREN) 75 MG EC tablet Take 1 tablet (75 mg total) by mouth 2 (two) times daily. 06/14/22   Ardith Dark, MD  EPINEPHrine (EPIPEN 2-PAK) 0.3 mg/0.3 mL IJ SOAJ injection Inject 0.3 mg into the muscle as needed for anaphylaxis. 06/14/22   Ardith Dark, MD  escitalopram (LEXAPRO) 5 MG tablet Take 1 tablet (5 mg total) by mouth daily. 04/27/22   Ardith Dark, MD  exenatide (BYETTA 5 MCG PEN) 5 MCG/0.02ML SOPN injection Inject 5 mcg into the skin 2 (two) times daily with a meal. 06/22/22   Ardith Dark, MD   hydrOXYzine (ATARAX) 25 MG tablet Take 1 tablet (25 mg total) by mouth every 6 (six) hours. 10/09/21   Adira Limburg A, PA-C  ibuprofen (ADVIL) 800 MG tablet TAKE 1 TABLET BY MOUTH EVERY 8 HOURS AS NEEDED 04/27/22   Romualdo Bolk, MD  Insulin Pen Needle 31G X 5 MM MISC Use to inject insulin bid 06/07/22   Ardith Dark, MD  ondansetron (ZOFRAN) 4 MG tablet Take 1 tablet (4 mg total) by mouth every 8 (eight) hours as needed for nausea or vomiting. 11/09/22   Nahara Dona A, PA-C  rosuvastatin (CRESTOR) 10 MG tablet Take 1 tablet (10 mg total) by mouth daily. 10/12/20   [name], MD      Allergies    Bee venom and Reglan [metoclopramide]    Review of Systems   Review of Systems  Gastrointestinal:  Positive for abdominal pain.  All other systems reviewed and are negative.   Physical Exam Updated Vital Signs BP (!) 158/102 (BP Location: Right Arm)   Pulse 74   Temp 98.2 F (36.8 C) (Oral)   Resp 15   Ht 5\' 4"  (1.626 m)   Wt 96.2 kg   LMP 02/06/2014 (LMP Unknown)   SpO2 99%   BMI 36.39 kg/m  Physical Exam Vitals and nursing note reviewed.  Constitutional:      General: She is not in acute distress.    Appearance: She is not diaphoretic.  HENT:     Head: Normocephalic and atraumatic.     Mouth/Throat:     Pharynx: No oropharyngeal exudate.  Eyes:     General: No scleral icterus.    Conjunctiva/sclera: Conjunctivae normal.  Cardiovascular:     Rate and Rhythm: Normal rate and regular rhythm.     Pulses: Normal pulses.     Heart sounds: Normal heart sounds.  Pulmonary:     Effort: Pulmonary effort is normal. No respiratory distress.     Breath sounds: Normal breath sounds. No wheezing.  Abdominal:     General: Bowel sounds are normal.     Palpations: Abdomen is soft. There is no mass.     Tenderness: There is abdominal tenderness in the right upper quadrant and epigastric area. There is no guarding or rebound.     Comments: Tenderness to palpation noted to  epigastric and right upper quadrant region.  Musculoskeletal:        General: Normal range of motion.     Cervical back: Normal range of motion and neck supple.  Skin:    General: Skin is warm and dry.  Neurological:     Mental Status: She is alert.  Psychiatric:        Behavior: Behavior normal.     ED Results / Procedures / Treatments   Labs (all labs ordered are listed, but only abnormal results are displayed) Labs Reviewed  COMPREHENSIVE METABOLIC PANEL - Abnormal; Notable for the following components:      Result Value   Glucose, Bld 193 (*)    All other components within normal limits  URINALYSIS, ROUTINE W REFLEX MICROSCOPIC - Abnormal; Notable for the following components:   Hgb urine dipstick TRACE (*)    All other components within normal limits  LIPASE, BLOOD  CBC    EKG None  Radiology US PELVIC COMPLETE W TRANSVAGINAL AND TORSION R/O  Result Date: 11/09/2022 CLINICAL DATA:  Right lower quadrant abdominal pain. EXAM: TRANSABDOMINAL AND TRANSVAGINAL ULTRASOUND OF PELVIS DOPPLER ULTRASOUND OF OVARIES TECHNIQUE: Both transabdominal and transvaginal ultrasound examinations of the pelvis were performed. Transabdominal technique was performed for global imaging of the pelvis including uterus, ovaries, adnexal regions, and pelvic cul-de-sac. It was necessary to proceed with endovaginal exam following the transabdominal exam to visualize the endometrium and ovaries. Color and duplex Doppler ultrasound was utilized to evaluate blood flow to the ovaries. COMPARISON:  Pelvic ultrasound dated 09/21/2021. FINDINGS: Uterus Hysterectomy. Endometrium Hysterectomy. Right ovary Measurements: 1.2 x 1.4 x 1.2 cm = volume: 1.1 mL. Normal appearance/no adnexal mass. Left ovary The left ovary is not visualized and obscured by bowel gas. Pulsed Doppler evaluation of the right ovary demonstrates normal low-resistance arterial and venous waveforms. Other findings Small free fluid within the  pelvis. IMPRESSION: 1. Status post prior hysterectomy. 2. Unremarkable right ovary. 3. Nonvisualization of the left ovary. Electronically Signed   By: Elgie Collard M.D.   On: 11/09/2022 17:46   CT ABDOMEN PELVIS W CONTRAST  Result Date: 11/09/2022 CLINICAL DATA:  Right lower quadrant abdominal pain since yesterday. Diarrhea yesterday with urinary frequency. History of diabetes and hysterectomy. EXAM: CT ABDOMEN AND PELVIS WITH CONTRAST TECHNIQUE: Multidetector CT imaging of the abdomen and pelvis was performed using the standard protocol following bolus administration of intravenous contrast. RADIATION DOSE REDUCTION: This exam was performed according to the departmental dose-optimization program which includes automated exposure control, adjustment of the mA and/or kV according to patient size and/or use of iterative reconstruction technique. CONTRAST:  85mL  OMNIPAQUE IOHEXOL 300 MG/ML  SOLN COMPARISON:  Abdominopelvic CT 09/17/2021. FINDINGS: Lower chest: Clear lung bases. No significant pleural or pericardial effusion. Hepatobiliary: Progressive hepatomegaly with the liver measuring up to 21.5 cm in length. Diffuse low density throughout the liver consistent with steatosis. No focal abnormalities are identified. No evidence of gallstones, gallbladder wall thickening or biliary dilatation. Pancreas: Unremarkable. No pancreatic ductal dilatation or surrounding inflammatory changes. Spleen: Normal in size without focal abnormality. Adrenals/Urinary Tract: Both adrenal glands appear normal. No evidence of urinary tract calculus, suspicious renal lesion or hydronephrosis. The bladder appears unremarkable for its degree of distention. Stomach/Bowel: No enteric contrast administered. The stomach appears unremarkable for its degree of distension. No evidence of bowel wall thickening, distention or surrounding inflammatory change. The appendix is well visualized and appears normal. Vascular/Lymphatic: There are no  enlarged abdominal or pelvic lymph nodes. Mild iliac atherosclerosis. No evidence of aneurysm or large vessel occlusion. Reproductive: Status post hysterectomy. Stable appearance of the adnexa with probable residual ovarian tissue bilaterally. No suspicious adnexal findings. Other: Tiny umbilical hernia containing only fat. No ascites, focal extraluminal fluid collection or pneumoperitoneum. Musculoskeletal: No acute or significant osseous findings. IMPRESSION: 1. No acute findings or explanation for the patient's symptoms. The appendix appears normal. 2. Progressive hepatomegaly and hepatic steatosis. Electronically Signed   By: Carey Bullocks M.D.   On: 11/09/2022 15:34    Procedures Procedures    Medications Ordered in ED Medications  oxyCODONE-acetaminophen (PERCOCET/ROXICET) 5-325 MG per tablet 1 tablet (has no administration in time range)  sodium chloride 0.9 % bolus 1,000 mL (0 mLs Intravenous Stopped 11/09/22 1838)  ondansetron (ZOFRAN) injection 4 mg (4 mg Intravenous Given 11/09/22 1251)  iohexol (OMNIPAQUE) 300 MG/ML solution 100 mL (85 mLs Intravenous Contrast Given 11/09/22 1327)  morphine (PF) 4 MG/ML injection 4 mg (4 mg Intravenous Given 11/09/22 1623)    ED Course/ Medical Decision Making/ A&P Clinical Course as of 11/09/22 1915  Wed Nov 09, 2022  1607 Pt re-evaluated and resting comfortably on stretcher. Pt requesting pain medications at this time. Discussed with patient lab and imaging findings. Discussed with patient treatment plan. Answered all available questions.   [SB]  1908 Re-evaluated and noted continued abdominal pain. Pt will be given a dose of percocet prior to discharge. Discussed discharge treatment plan. Pt agreeable at this time. Pt appears safe for discharge. [SB]    Clinical Course User Index [SB] Angles Trevizo A, PA-C                             Medical Decision Making Amount and/or Complexity of Data Reviewed Labs: ordered. Radiology:  ordered.  Risk Prescription drug management.   Patient presents to the emergency department with RLQ abdominal pain onset yesterday. Still has gallbladder and appendix. No sick contacts. Pt afebrile. On exam, patient with Tenderness to palpation noted to epigastric and right upper quadrant region. Differential diagnosis includes pancreatitis, cholecystitis, appendicitis, diverticulitis.    Labs:  I ordered, and personally interpreted labs.  The pertinent results include:  Lipase unremarkable CBC unremarkable CMP with elevated glucose at 193 otherwise unremarkable Urinalysis with trace of hgb otherwise unremarkable  Imaging: I ordered imaging studies including CT abdomen pelvis with US pelvis, doppler I independently visualized and interpreted imaging which showed 1. No acute findings or explanation for the patient's symptoms. The appendix appears normal. 2. Progressive hepatomegaly and hepatic steatosis.  [place] with 1.  Status post prior hysterectomy. 2.  Unremarkable right ovary. 3.  Nonvisualization of left ovary I agree with the radiologist interpretation  Medications:  I ordered medication including IVF, Zofran, and Morphine for symptom management. Reevaluation of the patient after these medicines and interventions, I reevaluated the patient and found that they have improved I have reviewed the patients home medicines and have made adjustments as needed Tolerated PO challenge in ED with above treatment regimen   Disposition: Presenting suspicious for right lower quadrant abdominal pain.  Doubt concerns at this time for ovarian cyst/torsion, doubt concerns this time for pancreatitis, cholecystitis, appendicitis, diverticulitis. After consideration of the diagnostic results and the patients response to treatment, I feel that the patient would benefit from Discharge home.  Patient sent home with prescription for Zofran.  Will provide information for on-call Gastroenterologist.  Instructed pt to call and set up follow up appointment regarding todays ED visit with the gastroenterologist.  Patient instructed to follow-up with her primary care provider regarding today's ED visit.  Supportive care measures and strict return precautions discussed with patient at bedside. Pt acknowledges and verbalizes understanding. Pt appears safe for discharge. Follow up as indicated in discharge paperwork.   This chart was dictated using voice recognition software, Dragon. Despite the best efforts of this provider to proofread and correct errors, errors may still occur which can change documentation meaning.  Final Clinical Impression(s) / ED Diagnoses Final diagnoses:  RLQ abdominal pain    Rx / DC Orders ED Discharge Orders          Ordered    ondansetron (ZOFRAN) 4 MG tablet  Every 8 hours PRN        11/09/22 1902              Sophy Mesler A, PA-C 11/10/22 0928    Elayne Snare K, DO 11/10/22 1010

## 2022-11-09 NOTE — ED Notes (Signed)
Reviewed AVS/discharge instruction with patient. Time allotted for and all questions answered. Patient is agreeable for d/c and escorted to ed exit by staff.  

## 2022-11-09 NOTE — ED Notes (Signed)
Gave pt ginger ale and graham crackers for PO challenge. 

## 2022-11-09 NOTE — Discharge Instructions (Addendum)
It was a pleasure taking care of you today!  Your lab and imaging studies did not show any concerning emergent findings at this time.  You will be sent a prescription for Zofran, take as directed if you are experiencing nausea/vomiting.  If you have to take Zofran, wait at least 30 minutes before you attempt small sips/small bites of food/fluid.  Ensure to maintain fluid intake with water, tea, broth, soup, Pedialyte, Gatorade.  Follow-up with your primary care provider as needed regarding this ED visit.  Attached is information for the on-call GI specialist, you may call and set up a follow-up appointment regarding today's ED visit.  Return to the emergency department if you experience increasing/worsening symptoms. 

## 2023-01-06 ENCOUNTER — Encounter (HOSPITAL_COMMUNITY): Payer: Self-pay

## 2023-01-06 ENCOUNTER — Ambulatory Visit (HOSPITAL_COMMUNITY)
Admission: EM | Admit: 2023-01-06 | Discharge: 2023-01-06 | Disposition: A | Discharge status: Home / Self Care | Payer: Self-pay | Attending: Emergency Medicine | Admitting: Emergency Medicine

## 2023-01-06 DIAGNOSIS — E1165 Type 2 diabetes mellitus with hyperglycemia: Secondary | ICD-10-CM

## 2023-01-06 LAB — POCT URINALYSIS DIP (MANUAL ENTRY)
Bilirubin, UA: NEGATIVE
Glucose, UA: 500 mg/dL — AB
Ketones, POC UA: NEGATIVE mg/dL
Leukocytes, UA: NEGATIVE
Nitrite, UA: NEGATIVE
Protein Ur, POC: NEGATIVE mg/dL
Spec Grav, UA: 1.02 (ref 1.010–1.025)
Urobilinogen, UA: 0.2 E.U./dL
pH, UA: 5.5 (ref 5.0–8.0)

## 2023-01-06 LAB — POCT FASTING CBG KUC MANUAL ENTRY: POCT Glucose (KUC): 272 mg/dL — AB (ref 70–99)

## 2023-01-06 NOTE — Discharge Instructions (Addendum)
Your blood sugar is elevated, but it is not critically elevated.  You do not show signs of diabetic ketoacidosis.  Please seek immediate care at the nearest emergency department if your blood sugar is over 600, you develop confusion, increased thirst, increased urination, or any new concerning symptoms.  I have attached information for our mobile clinic, please follow-up with them as soon as possible.  I also advise getting established with [place] [facility], their information is attached to this paper.

## 2023-01-06 NOTE — ED Provider Notes (Signed)
[facility]    CSN: [id number] Arrival date & time: 01/06/23  1724      History   Chief Complaint Chief Complaint  Patient presents with   Diabetes    HPI Renee Knapp is a 47 y.o. female.   Patient presents to clinic with concerns for hyperglycemia.  She checked her blood glucose today and it was 315.  She has felt fatigued throughout the day.  She has had a banana and a potato salad.  She is not currently on medications for her type 2 diabetes.  She was on Ozempic and her insurance denied this medication, she did try another med, but had an allergic reaction.  She does not currently have any insurance and is not able to see her primary care provider.  She denies polyuria, polydipsia, or polyphagia. No dysuria.     The history is provided by the patient and medical records.  Diabetes    Past Medical History:  Diagnosis Date   Anemia    History   Diabetes mellitus without complication (HCC)    diet and exercise controlled - no meds   Hypertension    Obesity    Plantar fasciitis, bilateral    History - recvd cortisone injections bilateral- no current prob as 01/2014   SVD (spontaneous vaginal delivery)    x 2    Patient Active Problem List   Diagnosis Date Noted   Lateral epicondylitis, right elbow 03/15/2022   Probable carpal tunnel syndrome, right 03/15/2022   Hypertension associated with diabetes (HCC) 03/10/2021   GAD (generalized anxiety disorder) 08/30/2018   Vitamin D deficiency 01/18/2018   Dyslipidemia associated with type 2 diabetes mellitus (HCC) 11/25/2017   Controlled type 2 diabetes mellitus without complication, without long-term current use of insulin (HCC) 11/10/2017   S/P laparoscopic assisted vaginal hysterectomy (LAVH) 02/24/2014    Past Surgical History:  Procedure Laterality Date   CYSTOSCOPY N/A 02/24/2014   Procedure: CYSTOSCOPY;  Surgeon: Essie Hart, MD;  Location: [facility];  Service: Gynecology;  Laterality: N/A;    DILATION AND CURETTAGE OF UTERUS     MAB   LAPAROSCOPIC ASSISTED VAGINAL HYSTERECTOMY N/A 02/24/2014   Procedure: LAPAROSCOPIC ASSISTED VAGINAL HYSTERECTOMY WITH BILATERAL SALPINGECTOMY ;  Surgeon: Essie Hart, MD;  Location: [facility];  Service: Gynecology;  Laterality: N/A;   TUBAL LIGATION     WISDOM TOOTH EXTRACTION      OB History     Gravida  3   Para      Term      Preterm      AB  1   Living  2      SAB  1   IAB      Ectopic      Multiple      Live Births               Home Medications    Prior to Admission medications   Medication Sig Start Date End Date Taking? Authorizing Provider  albuterol (VENTOLIN HFA) 108 (90 Base) MCG/ACT inhaler Inhale 1-2 puffs into the lungs every 6 (six) hours as needed for wheezing or shortness of breath. 05/12/22   Raspet, Noberto Retort, PA-C  Bayer Microlet Lancets lancets USE 1 SINGLE USE LANCETS AS NEEDED TO CHECK BLOOD SUGAR 04/24/19   [name], MD  CONTOUR NEXT TEST test strip USE 1 LANCETS AS NEEDED TO CHECK BLOOD SUGAR 03/30/20   [name], MD  cyclobenzaprine (FLEXERIL) 10 MG tablet Take 1  tablet (10 mg total) by mouth 3 (three) times daily as needed for muscle spasms. 06/14/22   Ardith Dark, MD  diclofenac (VOLTAREN) 75 MG EC tablet Take 1 tablet (75 mg total) by mouth 2 (two) times daily. 06/14/22   Ardith Dark, MD  EPINEPHrine (EPIPEN 2-PAK) 0.3 mg/0.3 mL IJ SOAJ injection Inject 0.3 mg into the muscle as needed for anaphylaxis. 06/14/22   Ardith Dark, MD  escitalopram (LEXAPRO) 5 MG tablet Take 1 tablet (5 mg total) by mouth daily. 04/27/22   Ardith Dark, MD  exenatide (BYETTA 5 MCG PEN) 5 MCG/0.02ML SOPN injection Inject 5 mcg into the skin 2 (two) times daily with a meal. 06/22/22   Ardith Dark, MD  hydrOXYzine (ATARAX) 25 MG tablet Take 1 tablet (25 mg total) by mouth every 6 (six) hours. 10/09/21   Blue, Soijett A, PA-C  ibuprofen (ADVIL) 800 MG tablet TAKE 1 TABLET BY MOUTH EVERY 8 HOURS AS NEEDED  04/27/22   Romualdo Bolk, MD  Insulin Pen Needle 31G X 5 MM MISC Use to inject insulin bid 06/07/22   Ardith Dark, MD  ondansetron (ZOFRAN) 4 MG tablet Take 1 tablet (4 mg total) by mouth every 8 (eight) hours as needed for nausea or vomiting. 11/09/22   Blue, Soijett A, PA-C  rosuvastatin (CRESTOR) 10 MG tablet Take 1 tablet (10 mg total) by mouth daily. 10/12/20   [name], MD    Family History Family History  Problem Relation Age of Onset   Diabetes Mother    Heart disease Mother    Hypertension Mother    Hypertension Father    High Cholesterol Father    Hypertension Sister    Kidney disease Sister    Stroke Sister    Hypertension Sister    Breast cancer Maternal Aunt    Hearing loss Son    Cancer Other    Hypertension Other    Diabetes Other     Social History Social History   Tobacco Use   Smoking status: Never   Smokeless tobacco: Never  Vaping Use   Vaping status: Never Used  Substance Use Topics   Alcohol use: No   Drug use: No     Allergies   Bee venom and Reglan [metoclopramide]   Review of Systems Review of Systems  Constitutional:  Positive for fatigue.  Endocrine: Negative for polydipsia, polyphagia and polyuria.  Genitourinary:  Negative for dysuria.     Physical Exam Triage Vital Signs ED Triage Vitals [01/06/23 1745]  Encounter Vitals Group     BP 116/75     Systolic BP Percentile      Diastolic BP Percentile      Pulse Rate 84     Resp 16     Temp 97.8 F (36.6 C)     Temp Source Oral     SpO2 98 %     Weight      Height      Head Circumference      Peak Flow      Pain Score      Pain Loc      Pain Education      Exclude from Growth Chart    No data found.  Updated Vital Signs BP 116/75 (BP Location: Left Arm)   Pulse 84   Temp 97.8 F (36.6 C) (Oral)   Resp 16   LMP 02/06/2014 (LMP Unknown)   SpO2 98%   Visual Acuity Right  Eye Distance:   Left Eye Distance:   Bilateral Distance:    Right Eye Near:    Left Eye Near:    Bilateral Near:     Physical Exam Vitals and nursing note reviewed.  Constitutional:      Appearance: Normal appearance.  HENT:     Head: Normocephalic and atraumatic.     Right Ear: External ear normal.     Left Ear: External ear normal.     Nose: Nose normal.     Mouth/Throat:     Mouth: Mucous membranes are moist.  Eyes:     Conjunctiva/sclera: Conjunctivae normal.  Cardiovascular:     Rate and Rhythm: Normal rate.  Pulmonary:     Effort: Pulmonary effort is normal. No respiratory distress.  Musculoskeletal:        General: Normal range of motion.  Neurological:     General: No focal deficit present.     Mental Status: She is alert and oriented to person, place, and time.  Psychiatric:        Mood and Affect: Mood normal.        Behavior: Behavior normal.      UC Treatments / Results  Labs (all labs ordered are listed, but only abnormal results are displayed) Labs Reviewed  POCT FASTING CBG [facility] MANUAL ENTRY - Abnormal; Notable for the following components:      Result Value   POCT Glucose ([facility]) 272 (*)    All other components within normal limits  POCT URINALYSIS DIP (MANUAL ENTRY) - Abnormal; Notable for the following components:   Glucose, UA =500 (*)    Blood, UA trace-intact (*)    All other components within normal limits    EKG   Radiology No results found.  Procedures Procedures (including critical care time)  Medications Ordered in UC Medications - No data to display  Initial Impression / Assessment and Plan / UC Course  I have reviewed the triage vital signs and the nursing notes.  Pertinent labs & imaging results that were available during my care of the patient were reviewed by me and considered in my medical decision making (see chart for details).  Vitals and triage reviewed, patient is hemodynamically stable. CBG in clinic is 272 and UA is negative for ketones.  Patient is alert and oriented.  Low concern for diabetic  ketoacidosis or critical hyperglycemia at this time.  Has type 2 diabetes and is not currently on medication.  Provided with information for our mobile clinics and [place] [facility] to get reestablished and for management of her type 2 diabetes.  Dietary modifications discussed to help prevent hyper and hypoglycemia.  Plan of care, follow-up care and return precautions given, no questions at this time.     Final Clinical Impressions(s) / UC Diagnoses   Final diagnoses:  Type 2 diabetes mellitus with hyperglycemia, without long-term current use of insulin (HCC)     Discharge Instructions      Your blood sugar is elevated, but it is not critically elevated.  You do not show signs of diabetic ketoacidosis.  Please seek immediate care at the nearest emergency department if your blood sugar is over 600, you develop confusion, increased thirst, increased urination, or any new concerning symptoms.  I have attached information for our mobile clinic, please follow-up with them as soon as possible.  I also advise getting established with [place] [facility], their information is attached to this paper.  ED Prescriptions   None    PDMP not reviewed this encounter.   Myana Schlup, [place], [place] 01/06/23 1827

## 2023-01-06 NOTE — ED Triage Notes (Signed)
Pt states her blood sugar was over 300 today. Pt reports she feels fatigue today. Pt reports she had a banana and potato salad around. Pt has not taken any medication in several months.

## 2023-01-09 ENCOUNTER — Other Ambulatory Visit: Payer: Self-pay

## 2023-01-09 ENCOUNTER — Emergency Department (HOSPITAL_COMMUNITY)
Admission: EM | Admit: 2023-01-09 | Discharge: 2023-01-09 | Disposition: A | Discharge status: Home / Self Care | Payer: Medicaid Other | Attending: Emergency Medicine | Admitting: Emergency Medicine

## 2023-01-09 ENCOUNTER — Encounter (HOSPITAL_COMMUNITY): Payer: Self-pay | Admitting: Emergency Medicine

## 2023-01-09 DIAGNOSIS — E1165 Type 2 diabetes mellitus with hyperglycemia: Secondary | ICD-10-CM | POA: Diagnosis not present

## 2023-01-09 DIAGNOSIS — R739 Hyperglycemia, unspecified: Secondary | ICD-10-CM | POA: Diagnosis present

## 2023-01-09 DIAGNOSIS — Z7984 Long term (current) use of oral hypoglycemic drugs: Secondary | ICD-10-CM | POA: Insufficient documentation

## 2023-01-09 DIAGNOSIS — R519 Headache, unspecified: Secondary | ICD-10-CM | POA: Insufficient documentation

## 2023-01-09 DIAGNOSIS — R110 Nausea: Secondary | ICD-10-CM | POA: Diagnosis not present

## 2023-01-09 LAB — BASIC METABOLIC PANEL
Anion gap: 12 (ref 5–15)
BUN: 8 mg/dL (ref 6–20)
CO2: 23 mmol/L (ref 22–32)
Calcium: 9 mg/dL (ref 8.9–10.3)
Chloride: 99 mmol/L (ref 98–111)
Creatinine, Ser: 1.09 mg/dL — ABNORMAL HIGH (ref 0.44–1.00)
GFR, Estimated: >60 mL/min (ref >60)
Glucose, Bld: 322 mg/dL — ABNORMAL HIGH (ref 70–99)
Potassium: 4.1 mmol/L (ref 3.5–5.1)
Sodium: 134 mmol/L — ABNORMAL LOW (ref 135–145)

## 2023-01-09 LAB — CBC
HCT: 41.8 % (ref 36.0–46.0)
Hemoglobin: 13.3 g/dL (ref 12.0–15.0)
MCH: 26.4 pg (ref 26.0–34.0)
MCHC: 31.8 g/dL (ref 30.0–36.0)
MCV: 83.1 fL (ref 80.0–100.0)
Platelets: 335 10*3/uL (ref 150–400)
RBC: 5.03 MIL/uL (ref 3.87–5.11)
RDW: 13.2 % (ref 11.5–15.5)
WBC: 8.8 10*3/uL (ref 4.0–10.5)
nRBC: 0 % (ref 0.0–0.2)

## 2023-01-09 LAB — URINALYSIS, ROUTINE W REFLEX MICROSCOPIC
Bacteria, UA: NONE SEEN
Bilirubin Urine: NEGATIVE
Glucose, UA: >=500 mg/dL — AB
Ketones, ur: NEGATIVE mg/dL
Leukocytes,Ua: NEGATIVE
Nitrite: NEGATIVE
Protein, ur: NEGATIVE mg/dL
Specific Gravity, Urine: 1.016 (ref 1.005–1.030)
pH: 6 (ref 5.0–8.0)

## 2023-01-09 LAB — CBG MONITORING, ED
Glucose-Capillary: 370 mg/dL — ABNORMAL HIGH (ref 70–99)
Glucose-Capillary: 195 mg/dL — ABNORMAL HIGH (ref 70–99)
Glucose-Capillary: 160 mg/dL — ABNORMAL HIGH (ref 70–99)

## 2023-01-09 MED ORDER — ONDANSETRON 4 MG PO TBDP
4.0000 mg | ORAL_TABLET | Freq: Once | ORAL | Status: AC
  Administered 2023-01-09: 4 mg via ORAL
  Filled 2023-01-09: qty 1

## 2023-01-09 MED ORDER — ACCU-CHEK SOFTCLIX LANCETS MISC: 12 refills | Status: AC

## 2023-01-09 MED ORDER — METFORMIN HCL 500 MG PO TABS: 500.0000 mg | ORAL_TABLET | Freq: Two times a day (BID) | ORAL | 0 refills | Status: DC

## 2023-01-09 MED ORDER — ACETAMINOPHEN 325 MG PO TABS
650.0000 mg | ORAL_TABLET | Freq: Once | ORAL | Status: AC
  Administered 2023-01-09: 650 mg via ORAL
  Filled 2023-01-09: qty 2

## 2023-01-09 MED ORDER — METFORMIN HCL 500 MG PO TABS
500.0000 mg | ORAL_TABLET | Freq: Once | ORAL | Status: AC
  Administered 2023-01-09: 500 mg via ORAL
  Filled 2023-01-09: qty 1

## 2023-01-09 NOTE — Discharge Instructions (Addendum)
You were seen in the emergency department for your high blood sugar.  You had no signs of complications from your blood sugar being high.  I have given you prescription to restart you on your metformin and you should take this every day as prescribed and continue to check your blood sugars at home and keep a log of how your sugars have been running.  You should follow-up with your primary doctor to have your blood sugar rechecked and to see if you need any medication changes.  You should return to the emergency department if your blood sugar is too high to read, you have repetitive vomiting, you have severe abdominal pain or if you have any other new or concerning symptoms.

## 2023-01-09 NOTE — ED Provider Notes (Signed)
[place] EMERGENCY DEPARTMENT AT [facility] Provider Note   CSN: [id number] Arrival date & time: 01/09/23  0140     History  Chief Complaint  Patient presents with   Hyperglycemia    Renee Knapp is a 47 y.o. female.  Patient is a 47 year old female with a past medical history of diabetes not currently on medication presenting to the emergency department with hyperglycemia.  Patient states that her blood sugars have been running high in the 300s since Friday.  She states that she has associated headache and nausea.  She denies any abdominal pain.  She denies any fevers.  She denies any numbness or weakness or vision changes.  She states that she has had increased thirst and frequent urination but denies any dysuria or hematuria.  She states that she was taken off her metformin for her blood sugars being well-controlled and was transitioned from insulin to Ozempic however her insurance stopped covering Ozempic and she has not been on any diabetic medication since.  The history is provided by the patient.  Hyperglycemia      Home Medications Prior to Admission medications   Medication Sig Start Date End Date Taking? Authorizing Provider  Accu-Chek Softclix Lancets lancets Use as instructed 01/09/23  Yes Theresia Lo, Turkey K, DO  metFORMIN (GLUCOPHAGE) 500 MG tablet Take 1 tablet (500 mg total) by mouth 2 (two) times daily with a meal. 01/09/23 02/08/23 Yes Kingsley, Turkey K, DO  albuterol (VENTOLIN HFA) 108 (90 Base) MCG/ACT inhaler Inhale 1-2 puffs into the lungs every 6 (six) hours as needed for wheezing or shortness of breath. 05/12/22   Raspet, Noberto Retort, PA-C  Bayer Microlet Lancets lancets USE 1 SINGLE USE LANCETS AS NEEDED TO CHECK BLOOD SUGAR 04/24/19   [name], MD  CONTOUR NEXT TEST test strip USE 1 LANCETS AS NEEDED TO CHECK BLOOD SUGAR 03/30/20   [name], MD  cyclobenzaprine (FLEXERIL) 10 MG tablet Take 1 tablet (10 mg total) by mouth 3 (three)  times daily as needed for muscle spasms. 06/14/22   Ardith Dark, MD  diclofenac (VOLTAREN) 75 MG EC tablet Take 1 tablet (75 mg total) by mouth 2 (two) times daily. 06/14/22   Ardith Dark, MD  EPINEPHrine (EPIPEN 2-PAK) 0.3 mg/0.3 mL IJ SOAJ injection Inject 0.3 mg into the muscle as needed for anaphylaxis. 06/14/22   Ardith Dark, MD  escitalopram (LEXAPRO) 5 MG tablet Take 1 tablet (5 mg total) by mouth daily. 04/27/22   Ardith Dark, MD  exenatide (BYETTA 5 MCG PEN) 5 MCG/0.02ML SOPN injection Inject 5 mcg into the skin 2 (two) times daily with a meal. 06/22/22   Ardith Dark, MD  hydrOXYzine (ATARAX) 25 MG tablet Take 1 tablet (25 mg total) by mouth every 6 (six) hours. 10/09/21   Blue, Soijett A, PA-C  ibuprofen (ADVIL) 800 MG tablet TAKE 1 TABLET BY MOUTH EVERY 8 HOURS AS NEEDED 04/27/22   Romualdo Bolk, MD  Insulin Pen Needle 31G X 5 MM MISC Use to inject insulin bid 06/07/22   Ardith Dark, MD  ondansetron (ZOFRAN) 4 MG tablet Take 1 tablet (4 mg total) by mouth every 8 (eight) hours as needed for nausea or vomiting. 11/09/22   Blue, Soijett A, PA-C  rosuvastatin (CRESTOR) 10 MG tablet Take 1 tablet (10 mg total) by mouth daily. 10/12/20   [name], MD      Allergies    Bee venom and Reglan [metoclopramide]  Review of Systems   Review of Systems  Physical Exam Updated Vital Signs BP (!) 142/85   Pulse 84   Temp 97.8 F (36.6 C)   Resp 18   Ht 5\' 4"  (1.626 m)   Wt 96.2 kg   LMP 02/06/2014 (LMP Unknown)   SpO2 98%   BMI 36.39 kg/m  Physical Exam Vitals and nursing note reviewed.  Constitutional:      General: She is not in acute distress.    Appearance: Normal appearance.  HENT:     Head: Normocephalic and atraumatic.     Nose: Nose normal.     Mouth/Throat:     Mouth: Mucous membranes are moist.     Pharynx: Oropharynx is clear.  Eyes:     Extraocular Movements: Extraocular movements intact.     Conjunctiva/sclera: Conjunctivae normal.      Pupils: Pupils are equal, round, and reactive to light.  Cardiovascular:     Rate and Rhythm: Normal rate and regular rhythm.     Heart sounds: Normal heart sounds.  Pulmonary:     Effort: Pulmonary effort is normal.     Breath sounds: Normal breath sounds.  Abdominal:     General: Abdomen is flat.     Palpations: Abdomen is soft.     Tenderness: There is no abdominal tenderness.  Musculoskeletal:        General: Normal range of motion.     Cervical back: Normal range of motion.  Skin:    General: Skin is warm and dry.  Neurological:     General: No focal deficit present.     Mental Status: She is alert and oriented to person, place, and time.     Cranial Nerves: No cranial nerve deficit.     Sensory: No sensory deficit.     Motor: No weakness.  Psychiatric:        Mood and Affect: Mood normal.        Behavior: Behavior normal.     ED Results / Procedures / Treatments   Labs (all labs ordered are listed, but only abnormal results are displayed) Labs Reviewed  URINALYSIS, ROUTINE W REFLEX MICROSCOPIC - Abnormal; Notable for the following components:      Result Value   Glucose, UA >=500 (*)    Hgb urine dipstick SMALL (*)    All other components within normal limits  BASIC METABOLIC PANEL - Abnormal; Notable for the following components:   Sodium 134 (*)    Glucose, Bld 322 (*)    Creatinine, Ser 1.09 (*)    All other components within normal limits  CBG MONITORING, ED - Abnormal; Notable for the following components:   Glucose-Capillary 370 (*)    All other components within normal limits  CBG MONITORING, ED - Abnormal; Notable for the following components:   Glucose-Capillary 195 (*)    All other components within normal limits  CBG MONITORING, ED - Abnormal; Notable for the following components:   Glucose-Capillary 160 (*)    All other components within normal limits  CBC    EKG None  Radiology No results found.  Procedures Procedures    Medications  Ordered in ED Medications  metFORMIN (GLUCOPHAGE) tablet 500 mg (500 mg Oral Given 01/09/23 0811)  acetaminophen (TYLENOL) tablet 650 mg (650 mg Oral Given 01/09/23 0811)  ondansetron (ZOFRAN-ODT) disintegrating tablet 4 mg (4 mg Oral Given 01/09/23 0808)    ED Course/ Medical Decision Making/ A&P  Medical Decision Making This patient presents to the ED with chief complaint(s) of hyperglycemia with pertinent past medical history of DM which further complicates the presenting complaint. The complaint involves an extensive differential diagnosis and also carries with it a high risk of complications and morbidity.    The differential diagnosis includes hyperglycemic crisis, DKA, dehydration, electrolyte abnormality, no focal neurologic deficits and headache was not sudden onset making ICH or mass effect unlikely, no fevers or meningismus making meningitis unlikely  Additional history obtained: Additional history obtained from N/A Records reviewed primary care records, urgent care records  ED Course and Reassessment: On patient's arrival to the emergency department she is hemodynamically stable in no acute distress.  She is initially evaluated by triage and had labs performed that initially showed hyperglycemia with a blood sugar of 370.  Labs otherwise within normal range without signs of DKA.  Repeat Accu-Chek was performed just prior to my evaluation and improved to 160 without intervention.  The patient will be restarted on her metformin and was given Tylenol and Zofran for her headache and nausea.  She was recommended close primary care follow-up and was given strict return precautions.  Independent labs interpretation:  The following labs were independently interpreted: Hyperglycemia without DKA  Independent visualization of imaging: - N/A  Consultation: - Consulted or discussed management/test interpretation w/ external professional: N/A  Consideration  for admission or further workup: Patient has no emergent conditions requiring admission or further work-up at this time and is stable for discharge home with primary care follow-up  Social Determinants of health: N/A    Amount and/or Complexity of Data Reviewed Labs: ordered.  Risk OTC drugs. Prescription drug management.          Final Clinical Impression(s) / ED Diagnoses Final diagnoses:  Hyperglycemia    Rx / DC Orders ED Discharge Orders          Ordered    metFORMIN (GLUCOPHAGE) 500 MG tablet  2 times daily with meals        01/09/23 0830    Accu-Chek Softclix Lancets lancets        01/09/23 0830              Granville, Iona K, DO 01/09/23 [phone]

## 2023-01-09 NOTE — ED Triage Notes (Signed)
Pt c/o increasing CBGs at home since Friday, last CBG 335, triage CBG 370; pt reports she was taken off of Metformin and insulin in Oct due to trying to get ins to approve ozempic

## 2023-02-23 ENCOUNTER — Other Ambulatory Visit: Payer: Self-pay

## 2023-02-23 ENCOUNTER — Encounter (HOSPITAL_COMMUNITY): Payer: Self-pay

## 2023-02-23 ENCOUNTER — Emergency Department (HOSPITAL_COMMUNITY)
Admission: EM | Admit: 2023-02-23 | Discharge: 2023-02-23 | Discharge status: Against Medical Advice | Payer: Medicaid Other | Attending: Emergency Medicine | Admitting: Emergency Medicine

## 2023-02-23 DIAGNOSIS — R110 Nausea: Secondary | ICD-10-CM | POA: Insufficient documentation

## 2023-02-23 DIAGNOSIS — Z5321 Procedure and treatment not carried out due to patient leaving prior to being seen by health care provider: Secondary | ICD-10-CM | POA: Diagnosis not present

## 2023-02-23 DIAGNOSIS — R739 Hyperglycemia, unspecified: Secondary | ICD-10-CM | POA: Insufficient documentation

## 2023-02-23 DIAGNOSIS — R5383 Other fatigue: Secondary | ICD-10-CM | POA: Insufficient documentation

## 2023-02-23 LAB — URINALYSIS, ROUTINE W REFLEX MICROSCOPIC
Bilirubin Urine: NEGATIVE
Glucose, UA: >=500 mg/dL — AB
Ketones, ur: NEGATIVE mg/dL
Leukocytes,Ua: NEGATIVE
Nitrite: NEGATIVE
Protein, ur: NEGATIVE mg/dL
Specific Gravity, Urine: 1.018 (ref 1.005–1.030)
pH: 5 (ref 5.0–8.0)

## 2023-02-23 LAB — CBC
HCT: 41.6 % (ref 36.0–46.0)
Hemoglobin: 13.3 g/dL (ref 12.0–15.0)
MCH: 26.4 pg (ref 26.0–34.0)
MCHC: 32 g/dL (ref 30.0–36.0)
MCV: 82.5 fL (ref 80.0–100.0)
Platelets: 331 10*3/uL (ref 150–400)
RBC: 5.04 MIL/uL (ref 3.87–5.11)
RDW: 13.2 % (ref 11.5–15.5)
WBC: 8.7 10*3/uL (ref 4.0–10.5)
nRBC: 0 % (ref 0.0–0.2)

## 2023-02-23 LAB — CBG MONITORING, ED: Glucose-Capillary: 236 mg/dL — ABNORMAL HIGH (ref 70–99)

## 2023-02-23 NOTE — ED Triage Notes (Signed)
Pt states glucose has been high, 311 today; has been off insulin x 1 year; has been taking metformin at home; states she felt like she was going to pass out today; endorses nausea and lethargy, denies pain; denies recent illness, denies sick contacts

## 2023-02-23 NOTE — ED Notes (Signed)
Pt left without being seen.

## 2023-05-20 ENCOUNTER — Ambulatory Visit (INDEPENDENT_AMBULATORY_CARE_PROVIDER_SITE_OTHER): Payer: Medicaid Other

## 2023-05-20 ENCOUNTER — Encounter (HOSPITAL_COMMUNITY): Payer: Self-pay

## 2023-05-20 ENCOUNTER — Telehealth (HOSPITAL_COMMUNITY): Payer: Self-pay | Admitting: Family Medicine

## 2023-05-20 ENCOUNTER — Ambulatory Visit (HOSPITAL_COMMUNITY)
Admission: EM | Admit: 2023-05-20 | Discharge: 2023-05-20 | Disposition: A | Discharge status: Home / Self Care | Payer: Medicaid Other | Attending: Family Medicine | Admitting: Family Medicine

## 2023-05-20 DIAGNOSIS — R051 Acute cough: Secondary | ICD-10-CM

## 2023-05-20 DIAGNOSIS — R6889 Other general symptoms and signs: Secondary | ICD-10-CM

## 2023-05-20 DIAGNOSIS — J069 Acute upper respiratory infection, unspecified: Secondary | ICD-10-CM | POA: Diagnosis not present

## 2023-05-20 DIAGNOSIS — M549 Dorsalgia, unspecified: Secondary | ICD-10-CM | POA: Diagnosis not present

## 2023-05-20 MED ORDER — PROMETHAZINE-DM 6.25-15 MG/5ML PO SYRP: 5.0000 mL | ORAL_SOLUTION | Freq: Three times a day (TID) | ORAL | 0 refills | Status: DC | PRN

## 2023-05-20 MED ORDER — ACETAMINOPHEN 325 MG PO TABS
650.0000 mg | ORAL_TABLET | Freq: Once | ORAL | Status: AC
  Administered 2023-05-20: 650 mg via ORAL

## 2023-05-20 MED ORDER — ACETAMINOPHEN 325 MG PO TABS
ORAL_TABLET | ORAL | Status: AC
Start: 2023-05-20 — End: ?
  Filled 2023-05-20: qty 2

## 2023-05-20 NOTE — ED Triage Notes (Signed)
Pt presents to the office for cough and hoarse voice x 2 days. Patient reports chills. Patient has not taking any medication for her symptoms.

## 2023-05-20 NOTE — Telephone Encounter (Signed)
Neg chest xray discussed with her. She was appreciative of the call.  DG Chest 2 View Result Date: 05/20/2023 CLINICAL DATA:  Cough, hoarseness for 2 days EXAM: CHEST - 2 VIEW COMPARISON:  05/12/2022 FINDINGS: The heart size and mediastinal contours are within normal limits. Both lungs are clear. The visualized skeletal structures are unremarkable. IMPRESSION: No active cardiopulmonary disease. Electronically Signed   By: Sharlet Salina M.D.   On: 05/20/2023 17:17

## 2023-05-20 NOTE — ED Provider Notes (Signed)
[facility]    CSN: [id number] Arrival date & time: 05/20/23  1325      History   Chief Complaint Chief Complaint  Patient presents with   Cough   Generalized Body Aches    HPI AREION PROTZMAN is a 47 y.o. female.   The history is provided by the patient. No language interpreter was used.  Cough Cough characteristics:  Productive Sputum characteristics:  Green Severity:  Moderate Onset quality:  Gradual Duration:  3 days Timing:  Constant Progression:  Worsening (She had pneumonia last year and it feels the same) Chronicity:  New Context: not sick contacts and not with activity   Relieved by:  Nothing Worsened by:  Nothing Ineffective treatments: Ibuprofen. Associated symptoms: ear pain, rhinorrhea, sore throat and wheezing   Associated symptoms: no chills, no fever and no shortness of breath   Sore throat:    Severity:  Moderate   Onset quality:  Gradual   Duration:  3 days   Timing:  Constant   Progression since onset: Throat pain is worse with cough. Back Pain Location:  Lumbar spine (pain is worse on her right side) Quality:  Aching Radiates to:  Does not radiate Pain severity:  Severe (pain is about 8/10 in severity) Duration:  3 days Timing:  Constant Progression:  Waxing and waning Chronicity:  New Context: not recent injury   Context comment:  Denies dysuria. LMP - s/p hysterectomy in 2015 Relieved by:  Nothing Worsened by:  Nothing Ineffective treatments:  NSAIDs Associated symptoms: no abdominal pain, no dysuria and no fever   Otalgia Location:  Bilateral Quality:  Aching Severity:  Moderate Onset quality:  Gradual Duration:  3 days Timing:  Constant Progression:  Unchanged Chronicity:  New Context: not direct blow   Worsened by:  Nothing Associated symptoms: cough, rhinorrhea and sore throat   Associated symptoms: no abdominal pain, no fever, no hearing loss and no vomiting   Sore Throat This is a new problem. The problem  occurs constantly. The problem has not changed since onset.Pertinent negatives include no abdominal pain and no shortness of breath. Associated symptoms comments: She lost her voice since the onset. Nothing aggravates the symptoms. Nothing relieves the symptoms.    Past Medical History:  Diagnosis Date   Anemia    History   Diabetes mellitus without complication (HCC)    diet and exercise controlled - no meds   Hypertension    Obesity    Plantar fasciitis, bilateral    History - recvd cortisone injections bilateral- no current prob as 01/2014   SVD (spontaneous vaginal delivery)    x 2    Patient Active Problem List   Diagnosis Date Noted   Lateral epicondylitis, right elbow 03/15/2022   Probable carpal tunnel syndrome, right 03/15/2022   Hypertension associated with diabetes (HCC) 03/10/2021   GAD (generalized anxiety disorder) 08/30/2018   Vitamin D deficiency 01/18/2018   Dyslipidemia associated with type 2 diabetes mellitus (HCC) 11/25/2017   Controlled type 2 diabetes mellitus without complication, without long-term current use of insulin (HCC) 11/10/2017   S/P laparoscopic assisted vaginal hysterectomy (LAVH) 02/24/2014    Past Surgical History:  Procedure Laterality Date   CYSTOSCOPY N/A 02/24/2014   Procedure: CYSTOSCOPY;  Surgeon: Essie Hart, MD;  Location: [facility];  Service: Gynecology;  Laterality: N/A;   DILATION AND CURETTAGE OF UTERUS     MAB   LAPAROSCOPIC ASSISTED VAGINAL HYSTERECTOMY N/A 02/24/2014   Procedure: LAPAROSCOPIC ASSISTED VAGINAL HYSTERECTOMY WITH  BILATERAL SALPINGECTOMY ;  Surgeon: Essie Hart, MD;  Location: [facility];  Service: Gynecology;  Laterality: N/A;   TUBAL LIGATION     WISDOM TOOTH EXTRACTION      OB History     Gravida  3   Para      Term      Preterm      AB  1   Living  2      SAB  1   IAB      Ectopic      Multiple      Live Births               Home Medications    Prior to Admission medications    Medication Sig Start Date End Date Taking? Authorizing Provider  promethazine-dextromethorphan (PROMETHAZINE-DM) 6.25-15 MG/5ML syrup Take 5 mLs by mouth every 8 (eight) hours as needed for cough. 05/20/23  Yes Janit Pagan T, MD  Accu-Chek Softclix Lancets lancets Use as instructed 01/09/23   Elayne Snare K, DO  albuterol (VENTOLIN HFA) 108 (90 Base) MCG/ACT inhaler Inhale 1-2 puffs into the lungs every 6 (six) hours as needed for wheezing or shortness of breath. 05/12/22  Yes Raspet, Noberto Retort, PA-C  Bayer Microlet Lancets lancets USE 1 SINGLE USE LANCETS AS NEEDED TO CHECK BLOOD SUGAR 04/24/19  Yes [name], MD  CONTOUR NEXT TEST test strip USE 1 LANCETS AS NEEDED TO CHECK BLOOD SUGAR 03/30/20  Yes [name], MD  cyclobenzaprine (FLEXERIL) 10 MG tablet Take 1 tablet (10 mg total) by mouth 3 (three) times daily as needed for muscle spasms. 06/14/22   Ardith Dark, MD  diclofenac (VOLTAREN) 75 MG EC tablet Take 1 tablet (75 mg total) by mouth 2 (two) times daily. 06/14/22   Ardith Dark, MD  EPINEPHrine (EPIPEN 2-PAK) 0.3 mg/0.3 mL IJ SOAJ injection Inject 0.3 mg into the muscle as needed for anaphylaxis. 06/14/22   Ardith Dark, MD  escitalopram (LEXAPRO) 5 MG tablet Take 1 tablet (5 mg total) by mouth daily. 04/27/22  Yes Ardith Dark, MD  exenatide (BYETTA 5 MCG PEN) 5 MCG/0.02ML SOPN injection Inject 5 mcg into the skin 2 (two) times daily with a meal. 06/22/22  Yes Ardith Dark, MD  hydrOXYzine (ATARAX) 25 MG tablet Take 1 tablet (25 mg total) by mouth every 6 (six) hours. 10/09/21  Yes Blue, Soijett A, PA-C  ibuprofen (ADVIL) 800 MG tablet TAKE 1 TABLET BY MOUTH EVERY 8 HOURS AS NEEDED 04/27/22  Yes Romualdo Bolk, MD  Insulin Pen Needle 31G X 5 MM MISC Use to inject insulin bid 06/07/22  Yes Ardith Dark, MD  metFORMIN (GLUCOPHAGE) 500 MG tablet Take 1 tablet (500 mg total) by mouth 2 (two) times daily with a meal. 01/09/23 02/08/23  Elayne Snare K, DO   ondansetron (ZOFRAN) 4 MG tablet Take 1 tablet (4 mg total) by mouth every 8 (eight) hours as needed for nausea or vomiting. 11/09/22  Yes Blue, Soijett A, PA-C  rosuvastatin (CRESTOR) 10 MG tablet Take 1 tablet (10 mg total) by mouth daily. 10/12/20  Yes [name], MD    Family History Family History  Problem Relation Age of Onset   Diabetes Mother    Heart disease Mother    Hypertension Mother    Hypertension Father    High Cholesterol Father    Hypertension Sister    Kidney disease Sister    Stroke Sister    Hypertension Sister  Breast cancer Maternal Aunt    Hearing loss Son    Cancer Other    Hypertension Other    Diabetes Other     Social History Social History   Tobacco Use   Smoking status: Never   Smokeless tobacco: Never  Vaping Use   Vaping status: Never Used  Substance Use Topics   Alcohol use: No   Drug use: No     Allergies   Bee venom and Reglan [metoclopramide]   Review of Systems Review of Systems  Constitutional:  Negative for chills and fever.  HENT:  Positive for ear pain, rhinorrhea and sore throat. Negative for hearing loss.   Respiratory:  Positive for cough and wheezing. Negative for shortness of breath.   Gastrointestinal:  Negative for abdominal pain and vomiting.  Genitourinary:  Negative for dysuria.  Musculoskeletal:  Positive for back pain.     Physical Exam Triage Vital Signs ED Triage Vitals  Encounter Vitals Group     BP      Systolic BP Percentile      Diastolic BP Percentile      Pulse      Resp      Temp      Temp src      SpO2      Weight      Height      Head Circumference      Peak Flow      Pain Score      Pain Loc      Pain Education      Exclude from Growth Chart    No data found.  Updated Vital Signs BP 115/80   Pulse 81   Temp 98.2 F (36.8 C) (Oral)   Resp 18   LMP 02/06/2014 (LMP Unknown)   SpO2 97%   Visual Acuity Right Eye Distance:   Left Eye Distance:   Bilateral Distance:     Right Eye Near:   Left Eye Near:    Bilateral Near:     Physical Exam Vitals and nursing note reviewed.  Constitutional:      General: She is not in acute distress.    Appearance: Normal appearance. She is ill-appearing. She is not toxic-appearing.  HENT:     Right Ear: Tympanic membrane and ear canal normal. There is no impacted cerumen.     Left Ear: Tympanic membrane and ear canal normal. There is no impacted cerumen.     Mouth/Throat:     Mouth: Mucous membranes are moist.     Pharynx: Oropharynx is clear. No oropharyngeal exudate or posterior oropharyngeal erythema.  Eyes:     General:        Right eye: No discharge.        Left eye: No discharge.     Conjunctiva/sclera: Conjunctivae normal.  Cardiovascular:     Rate and Rhythm: Normal rate and regular rhythm.     Heart sounds: Normal heart sounds. No murmur heard. Pulmonary:     Effort: Pulmonary effort is normal. No respiratory distress.     Breath sounds: Normal breath sounds. No wheezing.  Abdominal:     General: Abdomen is flat. Bowel sounds are normal. There is no distension.     Palpations: Abdomen is soft. There is no mass.     Tenderness: There is no abdominal tenderness.  Musculoskeletal:     Cervical back: Neck supple.     Lumbar back: No edema or tenderness. Normal range of motion.  Comments: Neg Costovertebral angle tenderness B/L  Neurological:     Mental Status: She is alert.      UC Treatments / Results  Labs (all labs ordered are listed, but only abnormal results are displayed) Labs Reviewed - No data to display  EKG   Radiology No results found.  Procedures Procedures (including critical care time)  Medications Ordered in UC Medications  acetaminophen (TYLENOL) tablet 650 mg (650 mg Oral Given 05/20/23 1606)    Initial Impression / Assessment and Plan / UC Course  I have reviewed the triage vital signs and the nursing notes.  Pertinent labs & imaging results that were  available during my care of the patient were reviewed by me and considered in my medical decision making (see chart for details).  Clinical Course as of 05/20/23 1635  Sat May 20, 2023  1634 Cough: Viral illness She is worried about pneumonia Benign pulm exam Chest x-ray done today was pending at the time of discharge However, I reviewed the image, which looks similar to her chest x-ray done in 2023, which was negative As discussed with her, I will call if her x-ray is positive for pneumonia and treat with antibiotics Promethazine DM prn cough provided F/U as needed She agreed with the plan [KE]  1634 Otalgia: Part of viral syndrome May continue Tylenol as needed for pain We gave her Tylenol 650 mg x 1 dose during this visit F/U soon if there is no improvement [KE]  1634 Pharyngitis with laryngitis Benign throat exam Centor score -1 = risk of bacterial pharyngitis is low Likely part of her viral illness Tylenol was given during this exam Continue the same at home F/U as needed [KE]  1634 Back pain Likely part of her viral illness Benign exam Tylenol as needed for pain F/U as needed [KE]  1634 Elevated BP Improved with recheck Likely due to her pain Monitor closely with PCP [KE]    Clinical Course User Index [KE] Doreene Eland, MD     Final Clinical Impressions(s) / UC Diagnoses   Final diagnoses:  Acute cough  Upper respiratory tract infection, unspecified type  Flu-like symptoms  Acute back pain, unspecified back location, unspecified back pain laterality     Discharge Instructions      It was nice seeing you. I think you have viral infection. Your chest xray is pending but the image looks similar to your most recent xray which was negative. However, it xray returns positive, I will call you with antibiotic treatment. In the meantime, use Tylenol as needed for pain and promethazine DM as needed for cough.     ED Prescriptions     Medication Sig  Dispense Auth. Provider   promethazine-dextromethorphan (PROMETHAZINE-DM) 6.25-15 MG/5ML syrup Take 5 mLs by mouth every 8 (eight) hours as needed for cough. 118 mL Doreene Eland, MD      PDMP not reviewed this encounter.   Doreene Eland, MD 05/20/23 [phone]

## 2023-05-20 NOTE — Discharge Instructions (Signed)
It was nice seeing you. I think you have viral infection. Your chest xray is pending but the image looks similar to your most recent xray which was negative. However, it xray returns positive, I will call you with antibiotic treatment. In the meantime, use Tylenol as needed for pain and promethazine DM as needed for cough.

## 2023-05-28 ENCOUNTER — Ambulatory Visit (HOSPITAL_COMMUNITY)
Admission: EM | Admit: 2023-05-28 | Discharge: 2023-05-28 | Disposition: A | Discharge status: Home / Self Care | Payer: Medicaid Other

## 2023-05-28 ENCOUNTER — Encounter (HOSPITAL_COMMUNITY): Payer: Self-pay | Admitting: Emergency Medicine

## 2023-05-28 DIAGNOSIS — J069 Acute upper respiratory infection, unspecified: Secondary | ICD-10-CM

## 2023-05-28 DIAGNOSIS — B029 Zoster without complications: Secondary | ICD-10-CM | POA: Diagnosis not present

## 2023-05-28 LAB — POCT URINALYSIS DIP (MANUAL ENTRY)
Bilirubin, UA: NEGATIVE
Glucose, UA: NEGATIVE mg/dL
Ketones, POC UA: NEGATIVE mg/dL
Leukocytes, UA: NEGATIVE
Nitrite, UA: NEGATIVE
Protein Ur, POC: NEGATIVE mg/dL
Spec Grav, UA: 1.015
Urobilinogen, UA: 0.2 U/dL
pH, UA: 6

## 2023-05-28 MED ORDER — VALACYCLOVIR HCL 1 G PO TABS
1000.0000 mg | ORAL_TABLET | Freq: Three times a day (TID) | ORAL | 0 refills | Status: AC
Start: 2023-05-28 — End: 2023-06-04

## 2023-05-28 MED ORDER — AMOXICILLIN-POT CLAVULANATE 875-125 MG PO TABS: 1.0000 | ORAL_TABLET | Freq: Two times a day (BID) | ORAL | 0 refills | Status: DC

## 2023-05-28 MED ORDER — PREDNISONE 20 MG PO TABS
40.0000 mg | ORAL_TABLET | Freq: Every day | ORAL | 0 refills | Status: AC
Start: 2023-05-28 — End: 2023-06-02

## 2023-05-28 NOTE — Discharge Instructions (Addendum)
 The rash on your right flank is consistent with shingles, this is causing your flank pain.  Start the antiviral as soon as possible to help prevent how long you have an outbreak and are in pain.  Keep the area covered.  You are no longer contagious once the blisters heal and crusted over.  Take the antibiotic twice daily with food for your respiratory infection.  The steroid burst should also help with inflammation.  Take medications with food and until finished.  Symptoms should improve with treatment, if no improvement or any changes please return to clinic or follow-up with your primary care.

## 2023-05-28 NOTE — ED Triage Notes (Signed)
 Pt reports she was seen here last weekend for her cough, congestion, cold and hot spells, and body aches. Been taking the cough medication and Ibuprofen  like instructed. Pt reports that she is still not any better. Reports some nausea but denies vomiting.  Yesterday started having right side/flank pains.  Had two negative home Covid test.

## 2023-05-28 NOTE — ED Provider Notes (Signed)
 [facility]    CSN: [id number] Arrival date & time: 05/28/23  1310      History   Chief Complaint Chief Complaint  Patient presents with   Cough   Generalized Body Aches    HPI [name] is a 48 y.o. female.   Patient presents to clinic complaining of ongoing cough, congestion, ear pain, hot and cold chills, gum pain, sore throat, headache and generalized bodyaches.  She was seen on 12/28 for symptoms, negative CXR at the time.  Took the cough medication without any improvement.  Presents to clinic for persistent symptoms.  Yesterday she was also having some right flank pain.  No dysuria.  No nausea or vomiting.  Had 2 negative home COVID test.  Reports a history of shingles.  The history is provided by the patient and medical records.  Cough   Past Medical History:  Diagnosis Date   Anemia    History   Diabetes mellitus without complication (HCC)    diet and exercise controlled - no meds   Hypertension    Obesity    Plantar fasciitis, bilateral    History - recvd cortisone injections bilateral- no current prob as 01/2014   SVD (spontaneous vaginal delivery)    x 2    Patient Active Problem List   Diagnosis Date Noted   Lateral epicondylitis, right elbow 03/15/2022   Probable carpal tunnel syndrome, right 03/15/2022   Hypertension associated with diabetes (HCC) 03/10/2021   GAD (generalized anxiety disorder) 08/30/2018   Vitamin D  deficiency 01/18/2018   Dyslipidemia associated with type 2 diabetes mellitus (HCC) 11/25/2017   Controlled type 2 diabetes mellitus without complication, without long-term current use of insulin  (HCC) 11/10/2017   S/P laparoscopic assisted vaginal hysterectomy (LAVH) 02/24/2014    Past Surgical History:  Procedure Laterality Date   CYSTOSCOPY N/A 02/24/2014   Procedure: CYSTOSCOPY;  Surgeon: Gigi Botts, MD;  Location: [facility];  Service: Gynecology;  Laterality: N/A;   DILATION AND CURETTAGE OF UTERUS     MAB    LAPAROSCOPIC ASSISTED VAGINAL HYSTERECTOMY N/A 02/24/2014   Procedure: LAPAROSCOPIC ASSISTED VAGINAL HYSTERECTOMY WITH BILATERAL SALPINGECTOMY ;  Surgeon: Gigi Botts, MD;  Location: [facility];  Service: Gynecology;  Laterality: N/A;   TUBAL LIGATION     WISDOM TOOTH EXTRACTION      OB History     Gravida  3   Para      Term      Preterm      AB  1   Living  2      SAB  1   IAB      Ectopic      Multiple      Live Births               Home Medications    Prior to Admission medications   Medication Sig Start Date End Date Taking? Authorizing Provider  amoxicillin -clavulanate (AUGMENTIN ) 875-125 MG tablet Take 1 tablet by mouth every 12 (twelve) hours. 05/28/23  Yes Jennier Schissler  N, FNP  glipiZIDE (GLUCOTROL XL) 5 MG 24 hr tablet Take 1 tablet by mouth daily. 03/24/23 03/23/24 Yes [provider]  predniSONE  (DELTASONE ) 20 MG tablet Take 2 tablets (40 mg total) by mouth daily for 5 days. 05/28/23 06/02/23 Yes Ranika Mcniel  N, FNP  valACYclovir  (VALTREX ) 1000 MG tablet Take 1 tablet (1,000 mg total) by mouth 3 (three) times daily for 7 days. 05/28/23 06/04/23 Yes Dreama, Oryon Gary  N, FNP  Accu-Chek Softclix  Lancets lancets Use as instructed 01/09/23   Kingsley, Victoria K, DO  albuterol  (VENTOLIN  HFA) 108 [phone] Base) MCG/ACT inhaler Inhale 1-2 puffs into the lungs every 6 (six) hours as needed for wheezing or shortness of breath. 05/12/22   Raspet, Rocky POUR, PA-C  Bayer Microlet Lancets lancets USE 1 SINGLE USE LANCETS AS NEEDED TO CHECK BLOOD SUGAR 04/24/19   Waddell Rake, MD  CONTOUR NEXT TEST test strip USE 1 LANCETS AS NEEDED TO CHECK BLOOD SUGAR 03/30/20   Waddell Rake, MD  cyclobenzaprine  (FLEXERIL ) 10 MG tablet Take 1 tablet (10 mg total) by mouth 3 (three) times daily as needed for muscle spasms. 06/14/22   Kennyth Worth HERO, MD  diclofenac  (VOLTAREN ) 75 MG EC tablet Take 1 tablet (75 mg total) by mouth 2 (two) times daily. 06/14/22   Kennyth Worth HERO, MD   EPINEPHrine  (EPIPEN  2-PAK) 0.3 mg/0.3 mL IJ SOAJ injection Inject 0.3 mg into the muscle as needed for anaphylaxis. 06/14/22   Kennyth Worth HERO, MD  escitalopram  (LEXAPRO ) 5 MG tablet Take 1 tablet (5 mg total) by mouth daily. 04/27/22   Kennyth Worth HERO, MD  exenatide  (BYETTA  5 MCG PEN) 5 MCG/0.02ML SOPN injection Inject 5 mcg into the skin 2 (two) times daily with a meal. 06/22/22   Kennyth Worth HERO, MD  hydrOXYzine  (ATARAX ) 25 MG tablet Take 1 tablet (25 mg total) by mouth every 6 (six) hours. 10/09/21   Blue, Soijett A, PA-C  ibuprofen  (ADVIL ) 800 MG tablet TAKE 1 TABLET BY MOUTH EVERY 8 HOURS AS NEEDED 04/27/22   Jertson, Jill Evelyn, MD  Insulin  Pen Needle 31G X 5 MM MISC Use to inject insulin  bid 06/07/22   Kennyth Worth HERO, MD  metFORMIN  (GLUCOPHAGE ) 500 MG tablet Take 1 tablet (500 mg total) by mouth 2 (two) times daily with a meal. 01/09/23 02/08/23  Kingsley, Victoria K, DO  ondansetron  (ZOFRAN ) 4 MG tablet Take 1 tablet (4 mg total) by mouth every 8 (eight) hours as needed for nausea or vomiting. 11/09/22   Blue, Soijett A, PA-C  promethazine -dextromethorphan (PROMETHAZINE -DM) 6.25-15 MG/5ML syrup Take 5 mLs by mouth every 8 (eight) hours as needed for cough. 05/20/23   Anders Otto DASEN, MD  rosuvastatin  (CRESTOR ) 10 MG tablet Take 1 tablet (10 mg total) by mouth daily. 10/12/20   Waddell Rake, MD    Family History Family History  Problem Relation Age of Onset   Diabetes Mother    Heart disease Mother    Hypertension Mother    Hypertension Father    High Cholesterol Father    Hypertension Sister    Kidney disease Sister    Stroke Sister    Hypertension Sister    Breast cancer Maternal Aunt    Hearing loss Son    Cancer Other    Hypertension Other    Diabetes Other     Social History Social History   Tobacco Use   Smoking status: Never   Smokeless tobacco: Never  Vaping Use   Vaping status: Never Used  Substance Use Topics   Alcohol use: No   Drug use: No     Allergies    Bee venom and Reglan  [metoclopramide ]   Review of Systems Review of Systems  Per HPI   Physical Exam Triage Vital Signs ED Triage Vitals  Encounter Vitals Group     BP 05/28/23 1324 122/85     Systolic BP Percentile --      Diastolic BP Percentile --  Pulse Rate 05/28/23 1324 83     Resp 05/28/23 1324 16     Temp 05/28/23 1324 97.6 F (36.4 C)     Temp Source 05/28/23 1324 Oral     SpO2 05/28/23 1324 97 %     Weight --      Height --      Head Circumference --      Peak Flow --      Pain Score 05/28/23 1325 9     Pain Loc --      Pain Education --      Exclude from Growth Chart --    No data found.  Updated Vital Signs BP 122/85 (BP Location: Right Arm)   Pulse 83   Temp 97.6 F (36.4 C) (Oral)   Resp 16   LMP 02/06/2014 (LMP Unknown)   SpO2 97%   Visual Acuity Right Eye Distance:   Left Eye Distance:   Bilateral Distance:    Right Eye Near:   Left Eye Near:    Bilateral Near:     Physical Exam Vitals and nursing note reviewed.  Constitutional:      Appearance: Normal appearance.  HENT:     Head: Normocephalic and atraumatic.     Right Ear: Tympanic membrane, ear canal and external ear normal.     Left Ear: Tympanic membrane, ear canal and external ear normal.     Nose: Congestion and rhinorrhea present.     Mouth/Throat:     Mouth: Mucous membranes are moist.     Pharynx: Posterior oropharyngeal erythema present.  Eyes:     General: No scleral icterus. Cardiovascular:     Rate and Rhythm: Normal rate and regular rhythm.     Heart sounds: Normal heart sounds. No murmur heard. Pulmonary:     Effort: Pulmonary effort is normal. No respiratory distress.     Breath sounds: Normal breath sounds.  Musculoskeletal:        General: Tenderness present. Normal range of motion.  Skin:    General: Skin is warm and dry.     Findings: Rash present.          Comments: Scattered small vesicles along right flank area. Area is TTP.  Neurological:      General: No focal deficit present.     Mental Status: She is alert and oriented to person, place, and time.  Psychiatric:        Mood and Affect: Mood normal.        Behavior: Behavior normal.      UC Treatments / Results  Labs (all labs ordered are listed, but only abnormal results are displayed) Labs Reviewed  POCT URINALYSIS DIP (MANUAL ENTRY) - Abnormal; Notable for the following components:      Result Value   Blood, UA trace-lysed (*)    All other components within normal limits    EKG   Radiology No results found.  Procedures Procedures (including critical care time)  Medications Ordered in UC Medications - No data to display  Initial Impression / Assessment and Plan / UC Course  I have reviewed the triage vital signs and the nursing notes.  Pertinent labs & imaging results that were available during my care of the patient were reviewed by me and considered in my medical decision making (see chart for details).  Vitals and triage reviewed, patient is hemodynamically stable.  Continued upper respiratory symptoms over the past 7 to 10 days, negative imaging and COVID testing at  last visit.  Has some dental pain and continued congestion.  Will treat with Augmentin  for bacterial URI and any dental issues.  Steroid burst given for complaints of wheezing and shortness of breath, lungs are vesicular to auscultation.  Heart with regular rate and rhythm.  Urinalysis with trace red blood cells.  No dysuria or urinary symptoms.  Right flank is tender to touch with the start of multiple scattered small vesicles along the right flank area.  Rash suspicious for shingles, right flank was tender for the past day or so prior to the rash eruption.  Will treat with Valtrex .  Plan of care, follow-up care return precautions given, no questions at this time.    Final Clinical Impressions(s) / UC Diagnoses   Final diagnoses:  Upper respiratory tract infection, unspecified type  Herpes  zoster without complication     Discharge Instructions      The rash on your right flank is consistent with shingles, this is causing your flank pain.  Start the antiviral as soon as possible to help prevent how long you have an outbreak and are in pain.  Keep the area covered.  You are no longer contagious once the blisters heal and crusted over.  Take the antibiotic twice daily with food for your respiratory infection.  The steroid burst should also help with inflammation.  Take medications with food and until finished.  Symptoms should improve with treatment, if no improvement or any changes please return to clinic or follow-up with your primary care.     ED Prescriptions     Medication Sig Dispense Auth. Provider   predniSONE  (DELTASONE ) 20 MG tablet Take 2 tablets (40 mg total) by mouth daily for 5 days. 10 tablet Dreama, Lafe Clerk  N, FNP   amoxicillin -clavulanate (AUGMENTIN ) 875-125 MG tablet Take 1 tablet by mouth every 12 (twelve) hours. 14 tablet Dreama, Rya Rausch  N, FNP   valACYclovir  (VALTREX ) 1000 MG tablet Take 1 tablet (1,000 mg total) by mouth 3 (three) times daily for 7 days. 21 tablet Dreama, Dierdra Salameh  N, FNP      PDMP not reviewed this encounter.   Dreama, Kert Shackett  N, FNP 05/28/23 1408

## 2023-05-29 ENCOUNTER — Ambulatory Visit (HOSPITAL_COMMUNITY): Payer: Self-pay

## 2023-07-10 ENCOUNTER — Encounter (HOSPITAL_COMMUNITY): Payer: Self-pay

## 2023-07-10 ENCOUNTER — Ambulatory Visit (HOSPITAL_COMMUNITY)
Admission: EM | Admit: 2023-07-10 | Discharge: 2023-07-10 | Disposition: A | Discharge status: Home / Self Care | Payer: Medicaid Other

## 2023-07-10 ENCOUNTER — Other Ambulatory Visit: Payer: Self-pay

## 2023-07-10 ENCOUNTER — Emergency Department (HOSPITAL_COMMUNITY): Payer: Medicaid Other

## 2023-07-10 ENCOUNTER — Emergency Department (HOSPITAL_COMMUNITY)
Admission: EM | Admit: 2023-07-10 | Discharge: 2023-07-11 | Discharge status: Against Medical Advice | Payer: Medicaid Other | Attending: Emergency Medicine | Admitting: Emergency Medicine

## 2023-07-10 DIAGNOSIS — R519 Headache, unspecified: Secondary | ICD-10-CM

## 2023-07-10 DIAGNOSIS — L049 Acute lymphadenitis, unspecified: Secondary | ICD-10-CM | POA: Insufficient documentation

## 2023-07-10 DIAGNOSIS — E119 Type 2 diabetes mellitus without complications: Secondary | ICD-10-CM | POA: Diagnosis not present

## 2023-07-10 DIAGNOSIS — R29810 Facial weakness: Secondary | ICD-10-CM | POA: Insufficient documentation

## 2023-07-10 DIAGNOSIS — R202 Paresthesia of skin: Secondary | ICD-10-CM | POA: Diagnosis not present

## 2023-07-10 DIAGNOSIS — Q670 Congenital facial asymmetry: Secondary | ICD-10-CM

## 2023-07-10 DIAGNOSIS — R200 Anesthesia of skin: Secondary | ICD-10-CM

## 2023-07-10 DIAGNOSIS — I10 Essential (primary) hypertension: Secondary | ICD-10-CM | POA: Insufficient documentation

## 2023-07-10 DIAGNOSIS — Z794 Long term (current) use of insulin: Secondary | ICD-10-CM | POA: Insufficient documentation

## 2023-07-10 DIAGNOSIS — R21 Rash and other nonspecific skin eruption: Secondary | ICD-10-CM | POA: Diagnosis present

## 2023-07-10 DIAGNOSIS — Z7984 Long term (current) use of oral hypoglycemic drugs: Secondary | ICD-10-CM | POA: Diagnosis not present

## 2023-07-10 DIAGNOSIS — I889 Nonspecific lymphadenitis, unspecified: Secondary | ICD-10-CM

## 2023-07-10 DIAGNOSIS — R4781 Slurred speech: Secondary | ICD-10-CM | POA: Diagnosis not present

## 2023-07-10 LAB — CBC
HCT: 44.6 % (ref 36.0–46.0)
Hemoglobin: 14.4 g/dL (ref 12.0–15.0)
MCH: 27.2 pg (ref 26.0–34.0)
MCHC: 32.3 g/dL (ref 30.0–36.0)
MCV: 84.2 fL (ref 80.0–100.0)
Platelets: 344 10*3/uL (ref 150–400)
RBC: 5.3 MIL/uL — ABNORMAL HIGH (ref 3.87–5.11)
RDW: 13.7 % (ref 11.5–15.5)
WBC: 9.9 10*3/uL (ref 4.0–10.5)
nRBC: 0 % (ref 0.0–0.2)

## 2023-07-10 LAB — COMPREHENSIVE METABOLIC PANEL
ALT: 24 U/L (ref 0–44)
AST: 25 U/L (ref 15–41)
Albumin: 3.8 g/dL (ref 3.5–5.0)
Alkaline Phosphatase: 77 U/L (ref 38–126)
Anion gap: 11 (ref 5–15)
BUN: 6 mg/dL (ref 6–20)
CO2: 27 mmol/L (ref 22–32)
Calcium: 9.2 mg/dL (ref 8.9–10.3)
Chloride: 101 mmol/L (ref 98–111)
Creatinine, Ser: 0.77 mg/dL (ref 0.44–1.00)
GFR, Estimated: >60 mL/min (ref >60)
Glucose, Bld: 99 mg/dL (ref 70–99)
Potassium: 3.5 mmol/L (ref 3.5–5.1)
Sodium: 139 mmol/L (ref 135–145)
Total Bilirubin: 0.7 mg/dL (ref 0.0–1.2)
Total Protein: 8 g/dL (ref 6.5–8.1)

## 2023-07-10 LAB — CBG MONITORING, ED: Glucose-Capillary: 91 mg/dL (ref 70–99)

## 2023-07-10 MED ORDER — LORAZEPAM 2 MG/ML IJ SOLN: 0.5000 mg | Freq: Once | INTRAMUSCULAR | Status: DC

## 2023-07-10 MED ORDER — ACETAMINOPHEN 325 MG PO TABS
650.0000 mg | ORAL_TABLET | Freq: Once | ORAL | Status: AC
  Administered 2023-07-10: 650 mg via ORAL
  Filled 2023-07-10: qty 2

## 2023-07-10 MED ORDER — PROCHLORPERAZINE EDISYLATE 10 MG/2ML IJ SOLN
10.0000 mg | Freq: Once | INTRAMUSCULAR | Status: AC
  Administered 2023-07-10: 10 mg via INTRAVENOUS
  Filled 2023-07-10: qty 2

## 2023-07-10 MED ORDER — LORAZEPAM 1 MG PO TABS
1.0000 mg | ORAL_TABLET | ORAL | Status: DC | PRN
  Administered 2023-07-10: 1 mg via ORAL
  Filled 2023-07-10: qty 1

## 2023-07-10 MED ORDER — DIPHENHYDRAMINE HCL 50 MG/ML IJ SOLN
25.0000 mg | Freq: Once | INTRAMUSCULAR | Status: AC
  Administered 2023-07-10: 25 mg via INTRAVENOUS
  Filled 2023-07-10: qty 1

## 2023-07-10 NOTE — ED Triage Notes (Signed)
Pt reports R sided facial pain and droop since Saturday.

## 2023-07-10 NOTE — ED Provider Triage Note (Cosign Needed Addendum)
Emergency Medicine Provider Triage Evaluation Note  Renee Knapp , a 48 y.o. female  was evaluated in triage.  Pt complains of right facial droop.  Started on Saturday.  States she has had persistent numbness and noticed some swelling in the right face just lateral to her right eye.  Denies any weakness or numbness in her extremities.  Denies any issues with speech.  Review of Systems  Positive: See above Negative: See above  Physical Exam  BP (!) 159/116 (BP Location: Right Arm)   Pulse 86   Temp 98 F (36.7 C) (Oral)   Resp 16   Ht 5\' 4"  (1.626 m)   Wt 93 kg   LMP 02/06/2014 (LMP Unknown)   SpO2 100%   BMI 35.19 kg/m  Gen:   Awake, no distress   Resp:  Normal effort  MSK:   Moves extremities without difficulty  Other:  Right-sided facial droop noted.  Right facial swelling noted as well.  Medical Decision Making  Medically screening exam initiated at 7:08 PM.  Appropriate orders placed.  Renee Knapp was informed that the remainder of the evaluation will be completed by another provider, this initial triage assessment does not replace that evaluation, and the importance of remaining in the ED until their evaluation is complete.  Work up started   Gareth Eagle, PA-C 07/10/23 1909    Gareth Eagle, PA-C 07/10/23 1910

## 2023-07-10 NOTE — ED Provider Notes (Signed)
[place] EMERGENCY DEPARTMENT AT [facility] Provider Note   CSN: [id number] Arrival date & time: 07/10/23  1851     History {Add pertinent medical, surgical, social history, OB history to HPI:1} Chief Complaint  Patient presents with   Facial Droop    Renee Knapp is a 48 y.o. female with past medical history of type 2 diabetes, dyslipidemia, GAD, HTN presents to ED for evaluation of paresthesia to right side of face, tongue that started on Saturday.  She reports that symptoms started with tender nodules in front of her ears and on her neck and right sided stabbing HA.  She was told by her mother that she had some slurred speech at 1300 today.  She denies visual disturbances, fall, blood thinners.  Initially, she went to urgent care for evaluation.  Urgent care recommended ED workup to rule out stroke.  Of note, she was diagnosed with shingles on 05/30/2023. She endorses rash was on bilateral arms. Treated with antiviral  HPI     Home Medications Prior to Admission medications   Medication Sig Start Date End Date Taking? Authorizing Provider  Accu-Chek Softclix Lancets lancets Use as instructed 01/09/23   Elayne Snare K, DO  albuterol (VENTOLIN HFA) 108 (90 Base) MCG/ACT inhaler Inhale 1-2 puffs into the lungs every 6 (six) hours as needed for wheezing or shortness of breath. 05/12/22   Raspet, Noberto Retort, PA-C  amoxicillin-clavulanate (AUGMENTIN) 875-125 MG tablet Take 1 tablet by mouth every 12 (twelve) hours. 05/28/23   Garrison, [place] N, FNP  Bayer Microlet Lancets lancets USE 1 SINGLE USE LANCETS AS NEEDED TO CHECK BLOOD SUGAR 04/24/19   [name], MD  CONTOUR NEXT TEST test strip USE 1 LANCETS AS NEEDED TO CHECK BLOOD SUGAR 03/30/20   [name], MD  cyclobenzaprine (FLEXERIL) 10 MG tablet Take 1 tablet (10 mg total) by mouth 3 (three) times daily as needed for muscle spasms. 06/14/22   Ardith Dark, MD  diclofenac (VOLTAREN) 75 MG EC tablet Take 1  tablet (75 mg total) by mouth 2 (two) times daily. 06/14/22   Ardith Dark, MD  EPINEPHrine (EPIPEN 2-PAK) 0.3 mg/0.3 mL IJ SOAJ injection Inject 0.3 mg into the muscle as needed for anaphylaxis. 06/14/22   Ardith Dark, MD  escitalopram (LEXAPRO) 5 MG tablet Take 1 tablet (5 mg total) by mouth daily. 04/27/22   Ardith Dark, MD  exenatide (BYETTA 5 MCG PEN) 5 MCG/0.02ML SOPN injection Inject 5 mcg into the skin 2 (two) times daily with a meal. 06/22/22   Ardith Dark, MD  glipiZIDE (GLUCOTROL XL) 5 MG 24 hr tablet Take 1 tablet by mouth daily. 03/24/23 03/23/24  [provider]  hydrOXYzine (ATARAX) 25 MG tablet Take 1 tablet (25 mg total) by mouth every 6 (six) hours. 10/09/21   Blue, Soijett A, PA-C  ibuprofen (ADVIL) 800 MG tablet TAKE 1 TABLET BY MOUTH EVERY 8 HOURS AS NEEDED 04/27/22   Romualdo Bolk, MD  Insulin Pen Needle 31G X 5 MM MISC Use to inject insulin bid 06/07/22   Ardith Dark, MD  metFORMIN (GLUCOPHAGE) 500 MG tablet Take 1 tablet (500 mg total) by mouth 2 (two) times daily with a meal. 01/09/23 02/08/23  Elayne Snare K, DO  ondansetron (ZOFRAN) 4 MG tablet Take 1 tablet (4 mg total) by mouth every 8 (eight) hours as needed for nausea or vomiting. 11/09/22   Blue, Soijett A, PA-C  promethazine-dextromethorphan (PROMETHAZINE-DM) 6.25-15 MG/5ML syrup Take  5 mLs by mouth every 8 (eight) hours as needed for cough. 05/20/23   Doreene Eland, MD  rosuvastatin (CRESTOR) 10 MG tablet Take 1 tablet (10 mg total) by mouth daily. 10/12/20   [name], MD      Allergies    Bee venom and Reglan [metoclopramide]    Review of Systems   Review of Systems  Constitutional:  Negative for chills, fatigue and fever.  Respiratory:  Negative for cough, chest tightness, shortness of breath and wheezing.   Cardiovascular:  Negative for chest pain and palpitations.  Gastrointestinal:  Negative for abdominal pain, constipation, diarrhea, nausea and vomiting.   Neurological:  Positive for facial asymmetry and numbness. Negative for dizziness, seizures, weakness, light-headedness and headaches.    Physical Exam Updated Vital Signs BP 131/81 (BP Location: Left Arm)   Pulse 79   Temp 97.9 F (36.6 C) (Oral)   Resp 16   Ht 5\' 4"  (1.626 m)   Wt 93 kg   LMP 02/06/2014 (LMP Unknown)   SpO2 100%   BMI 35.19 kg/m  Physical Exam Vitals and nursing note reviewed.  Constitutional:      General: She is not in acute distress.    Appearance: Normal appearance. She is not ill-appearing.  HENT:     Head: Normocephalic and atraumatic.     Comments: No vesicles noted in or around ears, eyes, nose    Right Ear: Tympanic membrane, ear canal and external ear normal.     Left Ear: Tympanic membrane, ear canal and external ear normal.     Mouth/Throat:     Lips: No lesions.     Mouth: Mucous membranes are moist.     Pharynx: No oropharyngeal exudate or posterior oropharyngeal erythema.     Comments: Uvula midline. No abscess, fluctuance, erythema noted in mouth Eyes:     General: Vision grossly intact. No scleral icterus.       Right eye: No discharge.        Left eye: No discharge.     Extraocular Movements:     Right eye: Normal extraocular motion and no nystagmus.     Left eye: Normal extraocular motion and no nystagmus.     Conjunctiva/sclera: Conjunctivae normal.     Comments: Able to close eyes without difficulty bilaterally  Cardiovascular:     Rate and Rhythm: Normal rate.     Pulses: Normal pulses.  Pulmonary:     Effort: Pulmonary effort is normal. No respiratory distress.     Breath sounds: Normal breath sounds. No stridor. No wheezing or rhonchi.  Chest:     Chest wall: No tenderness.  Abdominal:     General: There is no distension.     Palpations: Abdomen is soft. There is no mass.     Tenderness: There is no abdominal tenderness. There is no guarding.  Musculoskeletal:     Cervical back: Normal range of motion and neck supple.  No rigidity or tenderness.  Lymphadenopathy:     Cervical: No cervical adenopathy.  Skin:    General: Skin is warm.     Capillary Refill: Capillary refill takes less than 2 seconds.     Coloration: Skin is not jaundiced or pale.  Neurological:     Mental Status: She is alert and oriented to person, place, and time. Mental status is at baseline.     Cranial Nerves: Cranial nerve deficit present.     Sensory: Sensory deficit present.     Motor: No  weakness.     Coordination: Coordination normal.     Gait: Gait normal.     Deep Tendon Reflexes: Reflexes normal.     Comments: 1/2 sensation to right side of face. Able to raise eyebrows bilaterally. No obvious facial droop. Motor 5/5 of BUE and BLE. 2/2 BUE and BLE. CN exam significant for 1/2 sensation of V1,V2,V3, otherwise WNL. Follows commands appropriately and ambulates without difficulty     ED Results / Procedures / Treatments   Labs (all labs ordered are listed, but only abnormal results are displayed) Labs Reviewed  CBC - Abnormal; Notable for the following components:      Result Value   RBC 5.30 (*)    All other components within normal limits  COMPREHENSIVE METABOLIC PANEL  CBG MONITORING, ED    EKG None  Radiology CT Head Wo Contrast Result Date: 07/10/2023 CLINICAL DATA:  Neuro deficit, acute, stroke suspected EXAM: CT HEAD WITHOUT CONTRAST TECHNIQUE: Contiguous axial images were obtained from the base of the skull through the vertex without intravenous contrast. RADIATION DOSE REDUCTION: This exam was performed according to the departmental dose-optimization program which includes automated exposure control, adjustment of the mA and/or kV according to patient size and/or use of iterative reconstruction technique. COMPARISON:  None Available. FINDINGS: Brain: No evidence of acute infarction, hemorrhage, hydrocephalus, extra-axial collection or mass lesion/mass effect. Mild patchy white matter hypodensities are nonspecific  but compatible with chronic microvascular ischemic disease. Vascular: No hyperdense vessel or unexpected calcification. Skull: Normal. Negative for fracture or focal lesion. A white ex act Sinuses/Orbits: Mild paranasal sinus mucosal thickening. No acute orbital findings. Other: No mastoid effusions. IMPRESSION: No evidence of acute intracranial abnormality. Electronically Signed   By: Feliberto Harts M.D.   On: 07/10/2023 20:07    Procedures Procedures  {Document cardiac monitor, telemetry assessment procedure when appropriate:1}  Medications Ordered in ED Medications  acetaminophen (TYLENOL) tablet 650 mg (650 mg Oral Given 07/10/23 2048)    ED Course/ Medical Decision Making/ A&P   {   Click here for ABCD2, HEART and other calculatorsREFRESH Note before signing :1}                              Medical Decision Making Amount and/or Complexity of Data Reviewed Radiology: ordered.  Risk OTC drugs. Prescription drug management.   Patient presents to the ED for concern of slurred speech, facial droop right-sided headache, this involves an extensive number of treatment options, and is a complaint that carries with it a high risk of complications and morbidity.  The differential diagnosis includes CVA, TIA, Ramsay Hunt, trigeminal neuralgia, Bell's palsy, ICH, zoster ophthalmicus   Co morbidities that complicate the patient evaluation  Recent shingles diagnosis Type 2 diabetes Anxiety   Additional history obtained:  Additional history obtained from Nursing   External records from outside source obtained and reviewed including triage RN note   Lab Tests:  I Ordered, and personally interpreted labs.  The pertinent results include:   CBG 91   Imaging Studies ordered:  I ordered imaging studies including MRI with and without contrast Despite Compazine, Benadryl, Ativan, patient refuses MRI due to anxiety    Medicines ordered and prescription drug management:  I  ordered medication including Compazine, Benadryl, Ativan for headache and anxiety Reevaluation of the patient after these medicines showed that the patient improved I have reviewed the patients home medicines and have made adjustments as needed  Consultations Obtained:  I requested consultation with the ***,  and discussed lab and imaging findings as well as pertinent plan - they recommend: ***   Problem List / ED Course:  ***   Reevaluation:  After the interventions noted above, I reevaluated the patient and found that they have :resolved  Headache resolved but did not aid in anxiety    Dispostion:  After consideration of the diagnostic results and the patients response to treatment, I feel that the patent would benefit from ***.    Final Clinical Impression(s) / ED Diagnoses Final diagnoses:  None    Rx / DC Orders ED Discharge Orders     None

## 2023-07-10 NOTE — Discharge Instructions (Signed)
At this time I recommend that you go to the emergency room for imaging to rule out a stroke.  Since you have been having facial asymmetry and drooping, and intense headache and changes to your speech these are all very concerning signs of a potential stroke.  Please make sure that you let them know that you are worried about this and ensure that they do a CT scan or further imaging to rule this out.

## 2023-07-10 NOTE — ED Triage Notes (Addendum)
Patient reports right side facial pain and tingling that started yesterday. Patient states she can feel her lymph nodes. Patient reports right ear pain.   Home Intervention: Gabapentin

## 2023-07-10 NOTE — ED Provider Notes (Incomplete)
[place] EMERGENCY DEPARTMENT AT [facility] Provider Note   CSN: [id number] Arrival date & time: 07/10/23  1851     History {Add pertinent medical, surgical, social history, OB history to HPI:1} Chief Complaint  Patient presents with  . Facial Droop    Renee Knapp is a 48 y.o. female with past medical history of type 2 diabetes, dyslipidemia, GAD, HTN presents to ED for evaluation of paresthesia to right side of face, tongue that started on Saturday.  She reports that symptoms started with tender nodules in front of her ears and on her neck and right sided stabbing HA.  She was told by her mother that she had some slurred speech at 1300 today.  She denies visual disturbances, fall, blood thinners.  Initially, she went to urgent care for evaluation.  Urgent care recommended ED workup to rule out stroke.  Of note, she was recently diagnosed with shingles on 05/30/2023  HPI     Home Medications Prior to Admission medications   Medication Sig Start Date End Date Taking? Authorizing Provider  Accu-Chek Softclix Lancets lancets Use as instructed 01/09/23   Elayne Snare K, DO  albuterol (VENTOLIN HFA) 108 (90 Base) MCG/ACT inhaler Inhale 1-2 puffs into the lungs every 6 (six) hours as needed for wheezing or shortness of breath. 05/12/22   Raspet, Noberto Retort, PA-C  amoxicillin-clavulanate (AUGMENTIN) 875-125 MG tablet Take 1 tablet by mouth every 12 (twelve) hours. 05/28/23   Garrison, [place] N, FNP  Bayer Microlet Lancets lancets USE 1 SINGLE USE LANCETS AS NEEDED TO CHECK BLOOD SUGAR 04/24/19   [name], MD  CONTOUR NEXT TEST test strip USE 1 LANCETS AS NEEDED TO CHECK BLOOD SUGAR 03/30/20   [name], MD  cyclobenzaprine (FLEXERIL) 10 MG tablet Take 1 tablet (10 mg total) by mouth 3 (three) times daily as needed for muscle spasms. 06/14/22   Ardith Dark, MD  diclofenac (VOLTAREN) 75 MG EC tablet Take 1 tablet (75 mg total) by mouth 2 (two) times daily. 06/14/22    Ardith Dark, MD  EPINEPHrine (EPIPEN 2-PAK) 0.3 mg/0.3 mL IJ SOAJ injection Inject 0.3 mg into the muscle as needed for anaphylaxis. 06/14/22   Ardith Dark, MD  escitalopram (LEXAPRO) 5 MG tablet Take 1 tablet (5 mg total) by mouth daily. 04/27/22   Ardith Dark, MD  exenatide (BYETTA 5 MCG PEN) 5 MCG/0.02ML SOPN injection Inject 5 mcg into the skin 2 (two) times daily with a meal. 06/22/22   Ardith Dark, MD  glipiZIDE (GLUCOTROL XL) 5 MG 24 hr tablet Take 1 tablet by mouth daily. 03/24/23 03/23/24  [provider]  hydrOXYzine (ATARAX) 25 MG tablet Take 1 tablet (25 mg total) by mouth every 6 (six) hours. 10/09/21   Blue, Soijett A, PA-C  ibuprofen (ADVIL) 800 MG tablet TAKE 1 TABLET BY MOUTH EVERY 8 HOURS AS NEEDED 04/27/22   Romualdo Bolk, MD  Insulin Pen Needle 31G X 5 MM MISC Use to inject insulin bid 06/07/22   Ardith Dark, MD  metFORMIN (GLUCOPHAGE) 500 MG tablet Take 1 tablet (500 mg total) by mouth 2 (two) times daily with a meal. 01/09/23 02/08/23  Elayne Snare K, DO  ondansetron (ZOFRAN) 4 MG tablet Take 1 tablet (4 mg total) by mouth every 8 (eight) hours as needed for nausea or vomiting. 11/09/22   Blue, Soijett A, PA-C  promethazine-dextromethorphan (PROMETHAZINE-DM) 6.25-15 MG/5ML syrup Take 5 mLs by mouth every 8 (eight) hours as  needed for cough. 05/20/23   Doreene Eland, MD  rosuvastatin (CRESTOR) 10 MG tablet Take 1 tablet (10 mg total) by mouth daily. 10/12/20   [name], MD      Allergies    Bee venom and Reglan [metoclopramide]    Review of Systems   Review of Systems  Physical Exam Updated Vital Signs BP 131/81 (BP Location: Left Arm)   Pulse 79   Temp 97.9 F (36.6 C) (Oral)   Resp 16   Ht 5\' 4"  (1.626 m)   Wt 93 kg   LMP 02/06/2014 (LMP Unknown)   SpO2 100%   BMI 35.19 kg/m  Physical Exam  ED Results / Procedures / Treatments   Labs (all labs ordered are listed, but only abnormal results are displayed) Labs  Reviewed  CBC - Abnormal; Notable for the following components:      Result Value   RBC 5.30 (*)    All other components within normal limits  COMPREHENSIVE METABOLIC PANEL  CBG MONITORING, ED    EKG None  Radiology CT Head Wo Contrast Result Date: 07/10/2023 CLINICAL DATA:  Neuro deficit, acute, stroke suspected EXAM: CT HEAD WITHOUT CONTRAST TECHNIQUE: Contiguous axial images were obtained from the base of the skull through the vertex without intravenous contrast. RADIATION DOSE REDUCTION: This exam was performed according to the departmental dose-optimization program which includes automated exposure control, adjustment of the mA and/or kV according to patient size and/or use of iterative reconstruction technique. COMPARISON:  None Available. FINDINGS: Brain: No evidence of acute infarction, hemorrhage, hydrocephalus, extra-axial collection or mass lesion/mass effect. Mild patchy white matter hypodensities are nonspecific but compatible with chronic microvascular ischemic disease. Vascular: No hyperdense vessel or unexpected calcification. Skull: Normal. Negative for fracture or focal lesion. A white ex act Sinuses/Orbits: Mild paranasal sinus mucosal thickening. No acute orbital findings. Other: No mastoid effusions. IMPRESSION: No evidence of acute intracranial abnormality. Electronically Signed   By: Feliberto Harts M.D.   On: 07/10/2023 20:07    Procedures Procedures  {Document cardiac monitor, telemetry assessment procedure when appropriate:1}  Medications Ordered in ED Medications  acetaminophen (TYLENOL) tablet 650 mg (650 mg Oral Given 07/10/23 2048)    ED Course/ Medical Decision Making/ A&P   {   Click here for ABCD2, HEART and other calculatorsREFRESH Note before signing :1}                              Medical Decision Making Amount and/or Complexity of Data Reviewed Radiology: ordered.  Risk OTC drugs. Prescription drug management.   ***  {Document critical  care time when appropriate:1} {Document review of labs and clinical decision tools ie heart score, Chads2Vasc2 etc:1}  {Document your independent review of radiology images, and any outside records:1} {Document your discussion with family members, caretakers, and with consultants:1} {Document social determinants of health affecting pt's care:1} {Document your decision making why or why not admission, treatments were needed:1} Final Clinical Impression(s) / ED Diagnoses Final diagnoses:  None    Rx / DC Orders ED Discharge Orders     None

## 2023-07-10 NOTE — ED Provider Notes (Signed)
[facility]    CSN: [id number] Arrival date & time: 07/10/23  1603      History   Chief Complaint No chief complaint on file.   HPI Renee Knapp is a 48 y.o. female.   HPI  She reports she noticed a nodular lump on the lateral side of her right eye on Sat  She then noticed two other lumps in her neck as time progressed  She reports this AM she noticed she has difficulty opening her right eye and had a severe right sided headache She reports she has had some difficulty speaking - her mother commented on it this AM She reports her tongue is tingling as well   She denies falls or recent head injuries    Past Medical History:  Diagnosis Date   Anemia    History   Diabetes mellitus without complication (HCC)    diet and exercise controlled - no meds   Hypertension    Obesity    Plantar fasciitis, bilateral    History - recvd cortisone injections bilateral- no current prob as 01/2014   SVD (spontaneous vaginal delivery)    x 2    Patient Active Problem List   Diagnosis Date Noted   Lateral epicondylitis, right elbow 03/15/2022   Probable carpal tunnel syndrome, right 03/15/2022   Hypertension associated with diabetes (HCC) 03/10/2021   GAD (generalized anxiety disorder) 08/30/2018   Vitamin D deficiency 01/18/2018   Dyslipidemia associated with type 2 diabetes mellitus (HCC) 11/25/2017   Controlled type 2 diabetes mellitus without complication, without long-term current use of insulin (HCC) 11/10/2017   S/P laparoscopic assisted vaginal hysterectomy (LAVH) 02/24/2014    Past Surgical History:  Procedure Laterality Date   CYSTOSCOPY N/A 02/24/2014   Procedure: CYSTOSCOPY;  Surgeon: Essie Hart, MD;  Location: [facility];  Service: Gynecology;  Laterality: N/A;   DILATION AND CURETTAGE OF UTERUS     MAB   LAPAROSCOPIC ASSISTED VAGINAL HYSTERECTOMY N/A 02/24/2014   Procedure: LAPAROSCOPIC ASSISTED VAGINAL HYSTERECTOMY WITH BILATERAL SALPINGECTOMY ;  Surgeon:  Essie Hart, MD;  Location: [facility];  Service: Gynecology;  Laterality: N/A;   TUBAL LIGATION     WISDOM TOOTH EXTRACTION      OB History     Gravida  3   Para      Term      Preterm      AB  1   Living  2      SAB  1   IAB      Ectopic      Multiple      Live Births               Home Medications    Prior to Admission medications   Medication Sig Start Date End Date Taking? Authorizing Provider  Accu-Chek Softclix Lancets lancets Use as instructed 01/09/23  Yes Theresia Lo, [place] K, DO  albuterol (VENTOLIN HFA) 108 (90 Base) MCG/ACT inhaler Inhale 1-2 puffs into the lungs every 6 (six) hours as needed for wheezing or shortness of breath. 05/12/22  Yes Raspet, Noberto Retort, PA-C  Bayer Microlet Lancets lancets USE 1 SINGLE USE LANCETS AS NEEDED TO CHECK BLOOD SUGAR 04/24/19  Yes [name], MD  CONTOUR NEXT TEST test strip USE 1 LANCETS AS NEEDED TO CHECK BLOOD SUGAR 03/30/20  Yes [name], MD  escitalopram (LEXAPRO) 5 MG tablet Take 1 tablet (5 mg total) by mouth daily. 04/27/22  Yes Ardith Dark, MD  exenatide (BYETTA  5 MCG PEN) 5 MCG/0.02ML SOPN injection Inject 5 mcg into the skin 2 (two) times daily with a meal. 06/22/22  Yes Ardith Dark, MD  glipiZIDE (GLUCOTROL XL) 5 MG 24 hr tablet Take 1 tablet by mouth daily. 03/24/23 03/23/24 Yes [provider]  Insulin Pen Needle 31G X 5 MM MISC Use to inject insulin bid 06/07/22  Yes Ardith Dark, MD  amoxicillin-clavulanate (AUGMENTIN) 875-125 MG tablet Take 1 tablet by mouth every 12 (twelve) hours. 05/28/23   Garrison, [place] N, FNP  cyclobenzaprine (FLEXERIL) 10 MG tablet Take 1 tablet (10 mg total) by mouth 3 (three) times daily as needed for muscle spasms. 06/14/22   Ardith Dark, MD  diclofenac (VOLTAREN) 75 MG EC tablet Take 1 tablet (75 mg total) by mouth 2 (two) times daily. 06/14/22   Ardith Dark, MD  EPINEPHrine (EPIPEN 2-PAK) 0.3 mg/0.3 mL IJ SOAJ injection Inject 0.3 mg into the muscle  as needed for anaphylaxis. 06/14/22   Ardith Dark, MD  hydrOXYzine (ATARAX) 25 MG tablet Take 1 tablet (25 mg total) by mouth every 6 (six) hours. 10/09/21   Blue, Soijett A, PA-C  ibuprofen (ADVIL) 800 MG tablet TAKE 1 TABLET BY MOUTH EVERY 8 HOURS AS NEEDED 04/27/22   Romualdo Bolk, MD  metFORMIN (GLUCOPHAGE) 500 MG tablet Take 1 tablet (500 mg total) by mouth 2 (two) times daily with a meal. 01/09/23 02/08/23  Elayne Snare K, DO  ondansetron (ZOFRAN) 4 MG tablet Take 1 tablet (4 mg total) by mouth every 8 (eight) hours as needed for nausea or vomiting. 11/09/22   Blue, Soijett A, PA-C  promethazine-dextromethorphan (PROMETHAZINE-DM) 6.25-15 MG/5ML syrup Take 5 mLs by mouth every 8 (eight) hours as needed for cough. 05/20/23   Doreene Eland, MD  rosuvastatin (CRESTOR) 10 MG tablet Take 1 tablet (10 mg total) by mouth daily. 10/12/20   [name], MD    Family History Family History  Problem Relation Age of Onset   Diabetes Mother    Heart disease Mother    Hypertension Mother    Hypertension Father    High Cholesterol Father    Hypertension Sister    Kidney disease Sister    Stroke Sister    Hypertension Sister    Breast cancer Maternal Aunt    Hearing loss Son    Cancer Other    Hypertension Other    Diabetes Other     Social History Social History   Tobacco Use   Smoking status: Never   Smokeless tobacco: Never  Vaping Use   Vaping status: Never Used  Substance Use Topics   Alcohol use: No   Drug use: No     Allergies   Bee venom and Reglan [metoclopramide]   Review of Systems Review of Systems  Respiratory:  Negative for shortness of breath and wheezing.   Neurological:  Positive for facial asymmetry, speech difficulty and headaches. Negative for weakness and numbness.     Physical Exam Triage Vital Signs ED Triage Vitals [07/10/23 1812]  Encounter Vitals Group     BP 123/87     Systolic BP Percentile      Diastolic BP Percentile       Pulse Rate 73     Resp 15     Temp 98.3 F (36.8 C)     Temp Source Oral     SpO2 97 %     Weight      Height  Head Circumference      Peak Flow      Pain Score      Pain Loc      Pain Education      Exclude from Growth Chart    No data found.  Updated Vital Signs BP 123/87 (BP Location: Left Arm)   Pulse 73   Temp 98.3 F (36.8 C) (Oral)   Resp 15   LMP 02/06/2014 (LMP Unknown)   SpO2 97%   Visual Acuity Right Eye Distance:   Left Eye Distance:   Bilateral Distance:    Right Eye Near:   Left Eye Near:    Bilateral Near:     Physical Exam Vitals reviewed.  Constitutional:      General: She is awake.     Appearance: Normal appearance. She is well-developed and well-groomed.  HENT:     Head: Normocephalic and atraumatic.  Pulmonary:     Effort: Pulmonary effort is normal.  Lymphadenopathy:     Head:     Right side of head: Submandibular adenopathy present. No submental or preauricular adenopathy.     Left side of head: No submental, submandibular or preauricular adenopathy.     Cervical:     Right cervical: No superficial cervical adenopathy.    Left cervical: No superficial cervical adenopathy.     Upper Body:     Right upper body: No supraclavicular adenopathy.     Left upper body: No supraclavicular adenopathy.  Neurological:     Mental Status: She is alert and oriented to person, place, and time.     GCS: GCS eye subscore is 4. GCS verbal subscore is 5. GCS motor subscore is 6.     Cranial Nerves: Facial asymmetry present. No cranial nerve deficit or dysarthria.     Motor: No weakness, tremor, atrophy or abnormal muscle tone.     Gait: Gait is intact.     Comments: Comments: MENTAL STATUS: AAOx3, memory intact, fund of knowledge appropriate   LANG/SPEECH: Naming and repetition intact, fluent, no dysarthria, follows 3-step commands, answers questions appropriately     CRANIAL NERVES:   II: Pupils equal and reactive, no RAPD   III, IV, VI: EOM  intact, no gaze preference or deviation, no nystagmus.   V: normal sensation in V1, V2, and V3 segments bilaterally   VII: no asymmetry, no nasolabial fold flattening   VIII: normal hearing to speech   IX, X: normal palatal elevation, no uvular deviation   XI: 5/5 head turn and 5/5 shoulder shrug bilaterally   XII: midline tongue protrusion   MOTOR:  5/5 bilateral grip strength 5/5 strength dorsiflexion/plantarflexion b/l   SENSORY:  Normal to light touch  COORD: Normal heel to shin, no tremor, no dysmetria   STATION: normal stance, no truncal ataxia   GAIT: Normal; patient able to tip-toe, heel-walk.   Psychiatric:        Mood and Affect: Mood normal.        Behavior: Behavior normal. Behavior is cooperative.        Thought Content: Thought content normal.        Judgment: Judgment normal.      UC Treatments / Results  Labs (all labs ordered are listed, but only abnormal results are displayed) Labs Reviewed - No data to display  EKG   Radiology No results found.  Procedures Procedures (including critical care time)  Medications Ordered in UC Medications - No data to display  Initial Impression / Assessment and  Plan / UC Course  I have reviewed the triage vital signs and the nursing notes.  Pertinent labs & imaging results that were available during my care of the patient were reviewed by me and considered in my medical decision making (see chart for details).      Final Clinical Impressions(s) / UC Diagnoses   Final diagnoses:  Facial asymmetry  Acute nonintractable headache, unspecified headache type   Acute, new concern Patient reports that she has had nodular lumps along the right side of her face since Saturday.  She also reports some facial drooping since Saturday. She reports that this morning she noticed that she could not open her right eye and started to have a severe headache along the right side of her head. She has not taken any medications  for this.  With a reassuring neuroexam and vitals I am less suspicious for stroke but I am not able to complete appropriate imaging for definitive rule out.  More suspicious of Bell's palsy or potential allergic reaction given swelling along right orbit.  At this time I recommend that she seeks evaluation at the emergency room for further management.  Patient was agreeable and expressed understanding of this recommendation.  She reports that she will go to the emergency room for stroke rule out     Discharge Instructions      At this time I recommend that you go to the emergency room for imaging to rule out a stroke.  Since you have been having facial asymmetry and drooping, and intense headache and changes to your speech these are all very concerning signs of a potential stroke.  Please make sure that you let them know that you are worried about this and ensure that they do a CT scan or further imaging to rule this out.     ED Prescriptions   None    PDMP not reviewed this encounter.   Roselind Messier 07/10/23 1849

## 2023-07-11 ENCOUNTER — Ambulatory Visit (INDEPENDENT_AMBULATORY_CARE_PROVIDER_SITE_OTHER): Payer: Medicaid Other | Admitting: Internal Medicine

## 2023-07-11 ENCOUNTER — Ambulatory Visit: Payer: Self-pay | Admitting: Family Medicine

## 2023-07-11 ENCOUNTER — Encounter: Payer: Self-pay | Admitting: Internal Medicine

## 2023-07-11 VITALS — BP 102/80 | HR 90 | Wt 205.0 lb

## 2023-07-11 DIAGNOSIS — L03211 Cellulitis of face: Secondary | ICD-10-CM

## 2023-07-11 MED ORDER — DOXYCYCLINE HYCLATE 100 MG PO TABS: 100.0000 mg | ORAL_TABLET | Freq: Two times a day (BID) | ORAL | 0 refills | Status: DC

## 2023-07-11 MED ORDER — CEPHALEXIN 500 MG PO CAPS: 500.0000 mg | ORAL_CAPSULE | Freq: Four times a day (QID) | ORAL | 0 refills | Status: AC

## 2023-07-11 NOTE — Discharge Instructions (Addendum)
Thank you for letting us evaluate you today.  Your lab work was unremarkable.  Your CT scan was negative for hemorrhage.  We cannot rule out a stroke as we were unable to obtain the MRI scan.  Unsure whether your symptoms are due to Bell's palsy versus complex migraine versus stroke without completion of workup  Please return to emergency department if you experience unilateral weakness or numbness to include arms and legs, inability to walk, significant worsening of symptoms

## 2023-07-11 NOTE — Telephone Encounter (Signed)
  Chief Complaint: Headache, neck pain Symptoms:  right sided drooping eye, mouth, slurred speech yesterday Frequency: Since Saturday Pertinent Negatives: Patient denies fever, numbness, weakness Disposition: [] ED /[] Urgent Care (no appt availability in office) / [x] Appointment(In office/virtual)/ []  [facility]/ [] Home Care/ [] Refused Recommended Disposition /[] [facility]/ []  Follow-up with PCP Additional Notes: Pt reports she began developing some facial swelling on Saturday evening into Sunday after noting 2 small "bumps" above her right eye. She does report she is also recovering from shingles. Pt reports when she awoke Monday she was unable to open her right eye, she spoke to her mother on the phone who advised her speech was slurred and pt reports right sided facial drooping. She reports being seen in ED for stroke work up and was advised it may be Bell's Palsy. Pt notes 4/10 pain in her right face, head, jaw, neck. Pt notes continued drooping and tingling on the right side of her face. Denies fever, visions changes, weakness or numbness. OV scheduled today. This RN educated pt on home care, new-worsening symptoms, when to call back/seek emergent care. Pt verbalized understanding and agrees to plan.    Copied from CRM [id number]. Topic: Clinical - Red Word Triage >> Jul 11, 2023  1:28 PM Gurney Maxin H wrote: [facility] that prompted transfer to Nurse Triage: Patient recently was in emergency room they aren't sure if she had a stroke or Bell's Palsey, still having some pain. Headache in back of head, right side of a face hurts and has the droopiness. Patient recently was in emergency room they aren't sure if she had a stroke or Bell's Palsey Reason for Disposition  [1] Tingling (e.g., pins and needles) of the face, arm / hand, or leg / foot on one side of the body AND [2] present now (Exceptions: Chronic or recurrent symptom lasting > 4 weeks; or tingling from known cause, such as:  bumped elbow, carpal tunnel syndrome, pinched nerve, frostbite.)  Answer Assessment - Initial Assessment Questions 1. SYMPTOM: "What is the main symptom you are concerned about?" (e.g., weakness, numbness)     Right side facial drooping 2. ONSET: "When did this start?" (minutes, hours, days; while sleeping)     Saturday 3. LAST NORMAL: "When was the last time you (the patient) were normal (no symptoms)?"     Pt noticed Saturday evening, felt a knot above her right eye, had some facial swelling, when she woke up Monday morning, eye was swollen shut, slurred speech 4. PATTERN "Does this come and go, or has it been constant since it started?"  "Is it present now?"     Constant  6. NEUROLOGIC SYMPTOMS: "Have you had any of the following symptoms: headache, dizziness, vision loss, double vision, changes in speech, unsteady on your feet?"     Slurred speech, tingling in face has improved some but not resolved, HA  Protocols used: Neurologic Deficit-A-AH

## 2023-07-11 NOTE — Telephone Encounter (Signed)
 See note

## 2023-07-11 NOTE — Progress Notes (Addendum)
 Patient Care Team: Ardith Dark, MD as PCP - General (Family Medicine)  Visit Date: 07/11/23  Subjective:   Chief Complaint  Patient presents with   Facial drooping   Headache    Right side face tingling and and numbness.   Patient WU:Renee Knapp,Female DOB:10/19/1975,47 y.o. NWG:[id number]   48 y.o. Female presents today for Emergency Dept follow-up from 07/10/2022.  Was dx with Bell's palsy. CBC was normal, C-MET normal. CT Head W/o contrast found no evidence of acute intracranial abnormality. Reports that on Saturday she woke up with firm swelling on the right side of her face, mostly around her eye associated with a tingling sensation. On Sunday, swelling had worsened. Notes right-sided facial tenderness and right eye drainage.   Notes that she was seen at [facility] in early   January for Herpes Zoster/Shingles, which was initially treated with Valacyclovir HCL, which she reports she did not tolerate so was given Prednisone.    Had lower respiratory infection seen at an urgent care in late December treated with cough medication and CXR was reportedly normal. Past Medical History:  Diagnosis Date   Anemia    History   Diabetes mellitus without complication (HCC)    diet and exercise controlled - no meds   Hypertension    Obesity    Plantar fasciitis, bilateral    History - recvd cortisone injections bilateral- no current prob as 01/2014   SVD (spontaneous vaginal delivery)    x 2    Allergies  Allergen Reactions   Bee Venom Anaphylaxis   Reglan [Metoclopramide] Nausea And Vomiting and Anxiety    Family History  Problem Relation Age of Onset   Diabetes Mother    Heart disease Mother    Hypertension Mother    Hypertension Father    High Cholesterol Father    Hypertension Sister    Kidney disease Sister    Stroke Sister    Hypertension Sister    Breast cancer Maternal Aunt    Hearing loss Son    Cancer Other    Hypertension Other    Diabetes Other     Social History   Social History Narrative   Not on file   Review of Systems  Eyes:  Positive for discharge (right).  Skin:        (+) Hardened Swelling, Facial, Right-sided   Neurological:  Positive for tingling.     Objective:  Vitals: BP 102/80   Pulse 90   Wt 205 lb (93 kg)   LMP 02/06/2014 (LMP Unknown)   SpO2 96%   BMI 35.19 kg/m   Physical Exam Vitals and nursing note reviewed.  Constitutional:      General: She is not in acute distress.    Appearance: Normal appearance. She is not toxic-appearing.  HENT:     Head: Normocephalic and atraumatic.     Comments: Right-sided Facial Swelling superior to right zygomatic bone surrounding Right outer occipital, skin thick and erythematic. Mild skin breakage, similar to an open sore.       Mouth/Throat:     Pharynx: No oropharyngeal exudate or posterior oropharyngeal erythema.  Eyes:     Conjunctiva/sclera:     Right eye: Right conjunctiva is injected.     Left eye: Left conjunctiva is injected.     Pupils:     Right eye: Pupil is reactive (to moving object).     Left eye: Pupil is not reactive (to moving object).  Comments: PERRL Right-sided swelling above   Pulmonary:     Effort: Pulmonary effort is normal.  Lymphadenopathy:     Head:     Right side of head: Preauricular (small, mobile) adenopathy present.  Skin:    General: Skin is warm and dry.  Neurological:     Mental Status: She is alert and oriented to person, place, and time. Mental status is at baseline.  Psychiatric:        Mood and Affect: Mood normal.        Behavior: Behavior normal.        Thought Content: Thought content normal.        Judgment: Judgment normal.     Results:  Studies Obtained And Personally Reviewed By Me:  CT HEAD WITHOUT CONTRAST 07/10/2023  FINDINGS: Brain: No evidence of acute infarction, hemorrhage, hydrocephalus, extra-axial collection or mass lesion/mass effect. Mild patchy white matter hypodensities are  nonspecific but compatible with chronic microvascular ischemic disease.   Vascular: No hyperdense vessel or unexpected calcification.   Skull: Normal. Negative for fracture or focal lesion. A white ex act   Sinuses/Orbits: Mild paranasal sinus mucosal thickening. No acute orbital findings.   Other: No mastoid effusions.   IMPRESSION: No evidence of acute intracranial abnormality.   Labs:     Component Value Date/Time   NA 139 07/10/2023 2047   K 3.5 07/10/2023 2047   CL 101 07/10/2023 2047   CO2 27 07/10/2023 2047   GLUCOSE 99 07/10/2023 2047   BUN 6 07/10/2023 2047   CREATININE 0.77 07/10/2023 2047   CREATININE 0.76 02/15/2019 1527   CALCIUM 9.2 07/10/2023 2047   PROT 8.0 07/10/2023 2047   ALBUMIN 3.8 07/10/2023 2047   AST 25 07/10/2023 2047   ALT 24 07/10/2023 2047   ALKPHOS 77 07/10/2023 2047   BILITOT 0.7 07/10/2023 2047   GFRNONAA >60 07/10/2023 2047   GFRAA >60 06/28/2018 1723    Lab Results  Component Value Date   WBC 9.9 07/10/2023   HGB 14.4 07/10/2023   HCT 44.6 07/10/2023   MCV 84.2 07/10/2023   PLT 344 07/10/2023   Lab Results  Component Value Date   CHOL 218 (H) 10/11/2021   HDL 43.90 10/11/2021   LDLCALC 119 (H) 02/15/2019   LDLDIRECT 140.0 10/11/2021   TRIG 291.0 (H) 10/11/2021   CHOLHDL 5 10/11/2021   Lab Results  Component Value Date   HGBA1C 6.8 (A) 02/07/2022    Lab Results  Component Value Date   TSH 0.85 10/11/2021    Assessment & Plan:   Cellulitis of Face, Right-sided: labs and CT reviewed from yesterday, normal. Etiology unclear. Sending in 100 mg Doxycycline - take 1 tablet (100 mg total) by mouth 2 (two) times daily for 10 days  Time spent with chart review of recent visits, seeing patient, medical decision making and e-scribing medication is 30 minutes.  I,Emily Lagle,acting as a Neurosurgeon for Margaree Mackintosh, MD.,have documented all relevant documentation on the behalf of Margaree Mackintosh, MD,as directed by  Margaree Mackintosh, MD  while in the presence of Margaree Mackintosh, MD.   I, Margaree Mackintosh, MD, have reviewed all documentation for this visit. The documentation on 07/12/23 for the exam, diagnosis, procedures, and orders are all accurate and complete.

## 2023-07-18 ENCOUNTER — Ambulatory Visit: Payer: Self-pay | Admitting: Family Medicine

## 2023-07-18 NOTE — Telephone Encounter (Signed)
 Copied from CRM [id number]. Topic: Clinical - Red Word Triage >> Jul 18, 2023  2:08 PM Theodis Sato wrote: Red Word that prompted transfer to Nurse Triage: Patient is having a constant headache that radiates down to her neck and states she was just diagnosed with bells palsy.\   Chief Complaint: Severe headache  Symptoms: Headache, radiating to neck Frequency: constant Pertinent Negatives: Patient denies fever Disposition: [] ED /[] Urgent Care (no appt availability in office) / [] Appointment(In office/virtual)/ []  [facility]/ [] Home Care/ [x] Refused Recommended Disposition /[]  Mobile Bus/ []  Follow-up with PCP Additional Notes: Patient repors severe headache x 5 days, diagnosed with Bells Palsy on 2/20 in ED when she presented there with Headache and facial numbness. Pt started on Famotidine, Acyclovir, Erythromycin, Zofran, Gabapentin, and prednisone, but states she still having severe pain. ,RN protocol advising eval within 4 hours, no appts avail at PCP office. RN advising UC, pt refusing due to bad experience. Pt unable to visit other locations due to transportation and would prefer to see Dr. [name] at his next available on 2/27. Will route HP to clinic for follow-up  Reason for Disposition  [1] SEVERE headache (e.g., excruciating) AND [2] not improved after 2 hours of pain medicine  Answer Assessment - Initial Assessment Questions 1. LOCATION: "Where does it hurt?"      Top of head  2. ONSET: "When did the headache start?" (Minutes, hours or days)      X 5 days  3. PATTERN: "Does the pain come and go, or has it been constant since it started?"     Constant  4. SEVERITY: "How bad is the pain?" and "What does it keep you from doing?"  (e.g., Scale 1-10; mild, moderate, or severe)   - MILD (1-3): doesn't interfere with normal activities    - MODERATE (4-7): interferes with normal activities or awakens from sleep    - SEVERE (8-10): excruciating pain, unable to do  any normal activities        8/10 pain  5. RECURRENT SYMPTOM: "Have you ever had headaches before?" If Yes, ask: "When was the last time?" and "What happened that time?"      Years ago, was given Reglan, but had a bad reaction  6. CAUSE: "What do you think is causing the headache?"     Unsure  7. MIGRAINE: "Have you been diagnosed with migraine headaches?" If Yes, ask: "Is this headache similar?"      No migraine  8. HEAD INJURY: "Has there been any recent injury to the head?"      No head injury  9. OTHER SYMPTOMS: "Do you have any other symptoms?" (fever, stiff neck, eye pain, sore throat, cold symptoms)     Radiates to neck  10. PREGNANCY: "Is there any chance you are pregnant?" "When was your last menstrual period?"       No  Protocols used: Headache-A-AH

## 2023-07-18 NOTE — Telephone Encounter (Signed)
 Please see triage note for patient; pt refused recommended treatment

## 2023-07-19 NOTE — Patient Instructions (Signed)
 I feel your current presentation is c/w cellulitis of face. Have prescribed. Doxycycline for a 10 day course. Call your regular primary doctor if not responding within 2-3 days or sooner if worse.

## 2023-07-21 ENCOUNTER — Ambulatory Visit: Payer: Medicaid Other | Admitting: Family Medicine

## 2023-09-28 ENCOUNTER — Encounter (HOSPITAL_COMMUNITY): Payer: Self-pay | Admitting: *Deleted

## 2023-09-28 ENCOUNTER — Ambulatory Visit (HOSPITAL_COMMUNITY)
Admission: EM | Admit: 2023-09-28 | Discharge: 2023-09-28 | Disposition: A | Discharge status: Home / Self Care | Attending: Family Medicine | Admitting: Family Medicine

## 2023-09-28 DIAGNOSIS — M545 Low back pain, unspecified: Secondary | ICD-10-CM

## 2023-09-28 HISTORY — DX: Bell's palsy: G51.0

## 2023-09-28 MED ORDER — KETOROLAC TROMETHAMINE 30 MG/ML IJ SOLN
30.0000 mg | Freq: Once | INTRAMUSCULAR | Status: AC
  Administered 2023-09-28: 30 mg via INTRAMUSCULAR

## 2023-09-28 MED ORDER — TIZANIDINE HCL 4 MG PO TABS: 4.0000 mg | ORAL_TABLET | Freq: Three times a day (TID) | ORAL | 0 refills | Status: DC | PRN

## 2023-09-28 MED ORDER — KETOROLAC TROMETHAMINE 30 MG/ML IJ SOLN
INTRAMUSCULAR | Status: AC
  Filled 2023-09-28: qty 1

## 2023-09-28 NOTE — ED Triage Notes (Signed)
 Pt states she thinks she pulled a muscle at work, she works at [employer]. She has having low back pain X 2 week. She has been taking aleve , and used Bengay helped a little.

## 2023-09-28 NOTE — Discharge Instructions (Addendum)
 You were seen today for low back pain.  This appears to be muscular.  I have given you a shot of toradol  for pain.  I have sent out a muscle relaxer as well.  You may use motrin /aleve  for pain, as well as topical lidoderm  patches which are over the counter.  You may use heat/ice, which ever feels better.  Please return if not improving or worsening.

## 2023-09-28 NOTE — ED Provider Notes (Signed)
 [facility]    CSN: [id number] Arrival date & time: 09/28/23  1335      History   Chief Complaint Chief Complaint  Patient presents with   Back Pain    HPI DASHANA GROOVER is a 48 y.o. female.    Back Pain  Patient is here for back pain x 3 days.   Pain is across the low back.  She works for [employer] x 2 weeks and does a lot of lifting.  She has been using aleve  and gabapentin .  No pain into the legs/buttocks      Past Medical History:  Diagnosis Date   Anemia    History   Bell's palsy    06/2023   Diabetes mellitus without complication (HCC)    diet and exercise controlled - no meds   Hypertension    Obesity    Plantar fasciitis, bilateral    History - recvd cortisone injections bilateral- no current prob as 01/2014   SVD (spontaneous vaginal delivery)    x 2    Patient Active Problem List   Diagnosis Date Noted   Lateral epicondylitis, right elbow 03/15/2022   Probable carpal tunnel syndrome, right 03/15/2022   Hypertension associated with diabetes (HCC) 03/10/2021   GAD (generalized anxiety disorder) 08/30/2018   Vitamin D  deficiency 01/18/2018   Dyslipidemia associated with type 2 diabetes mellitus (HCC) 11/25/2017   Controlled type 2 diabetes mellitus without complication, without long-term current use of insulin  (HCC) 11/10/2017   S/P laparoscopic assisted vaginal hysterectomy (LAVH) 02/24/2014    Past Surgical History:  Procedure Laterality Date   CYSTOSCOPY N/A 02/24/2014   Procedure: CYSTOSCOPY;  Surgeon: Artemisa Bile, MD;  Location: [facility];  Service: Gynecology;  Laterality: N/A;   DILATION AND CURETTAGE OF UTERUS     MAB   LAPAROSCOPIC ASSISTED VAGINAL HYSTERECTOMY N/A 02/24/2014   Procedure: LAPAROSCOPIC ASSISTED VAGINAL HYSTERECTOMY WITH BILATERAL SALPINGECTOMY ;  Surgeon: Artemisa Bile, MD;  Location: [facility];  Service: Gynecology;  Laterality: N/A;   TUBAL LIGATION     WISDOM TOOTH EXTRACTION      OB History     Gravida  3    Para      Term      Preterm      AB  1   Living  2      SAB  1   IAB      Ectopic      Multiple      Live Births               Home Medications    Prior to Admission medications   Medication Sig Start Date End Date Taking? Authorizing Provider  glipiZIDE (GLUCOTROL) 5 MG tablet Take 5 mg by mouth daily before breakfast.   Yes [provider]  OZEMPIC , 0.25 OR 0.5 MG/DOSE, 2 MG/3ML SOPN Inject 0.5 mg once per week. 06/30/23  Yes [provider]  Accu-Chek Softclix Lancets lancets Use as instructed Patient not taking: Reported on 07/11/2023 01/09/23   Kingsley, Victoria K, DO  albuterol  (VENTOLIN  HFA) 108 (90 Base) MCG/ACT inhaler Inhale 1-2 puffs into the lungs every 6 (six) hours as needed for wheezing or shortness of breath. Patient not taking: Reported on 07/11/2023 05/12/22   Raspet, Chasidy Janak K, PA-C  Bayer Microlet Lancets lancets USE 1 SINGLE USE LANCETS AS NEEDED TO CHECK BLOOD SUGAR Patient not taking: Reported on 07/11/2023 04/24/19   Raymona Caldwell, MD  CONTOUR NEXT TEST test strip USE  1 LANCETS AS NEEDED TO CHECK BLOOD SUGAR Patient not taking: Reported on 07/11/2023 03/30/20   Raymona Caldwell, MD  cyclobenzaprine  (FLEXERIL ) 10 MG tablet Take 1 tablet (10 mg total) by mouth 3 (three) times daily as needed for muscle spasms. Patient not taking: Reported on 07/11/2023 06/14/22   Rodney Clamp, MD  diclofenac  (VOLTAREN ) 75 MG EC tablet Take 1 tablet (75 mg total) by mouth 2 (two) times daily. Patient not taking: Reported on 07/11/2023 06/14/22   Rodney Clamp, MD  doxycycline  (VIBRA -TABS) 100 MG tablet Take 1 tablet (100 mg total) by mouth 2 (two) times daily. 07/11/23   Sylvan Evener, MD  EPINEPHrine  (EPIPEN  2-PAK) 0.3 mg/0.3 mL IJ SOAJ injection Inject 0.3 mg into the muscle as needed for anaphylaxis. 06/14/22   Rodney Clamp, MD  escitalopram  (LEXAPRO ) 5 MG tablet Take 1 tablet (5 mg total) by mouth daily. Patient not taking: Reported on 07/11/2023  04/27/22   Rodney Clamp, MD  exenatide  (BYETTA  5 MCG PEN) 5 MCG/0.02ML SOPN injection Inject 5 mcg into the skin 2 (two) times daily with a meal. Patient not taking: Reported on 07/11/2023 06/22/22   Rodney Clamp, MD  hydrOXYzine  (ATARAX ) 25 MG tablet Take 1 tablet (25 mg total) by mouth every 6 (six) hours. Patient not taking: Reported on 07/11/2023 10/09/21   Blue, Soijett A, PA-C  ibuprofen  (ADVIL ) 800 MG tablet TAKE 1 TABLET BY MOUTH EVERY 8 HOURS AS NEEDED Patient not taking: Reported on 07/11/2023 04/27/22   Jertson, Jill Evelyn, MD  Insulin  Pen Needle 31G X 5 MM MISC Use to inject insulin  bid Patient not taking: Reported on 07/11/2023 06/07/22   Rodney Clamp, MD  metFORMIN  (GLUCOPHAGE ) 500 MG tablet Take 1 tablet (500 mg total) by mouth 2 (two) times daily with a meal. 01/09/23 02/08/23  Kingsley, Victoria K, DO  ondansetron  (ZOFRAN ) 4 MG tablet Take 1 tablet (4 mg total) by mouth every 8 (eight) hours as needed for nausea or vomiting. Patient not taking: Reported on 07/11/2023 11/09/22   Blue, Soijett A, PA-C  promethazine -dextromethorphan (PROMETHAZINE -DM) 6.25-15 MG/5ML syrup Take 5 mLs by mouth every 8 (eight) hours as needed for cough. Patient not taking: Reported on 07/11/2023 05/20/23   Arn Lane, MD  rosuvastatin  (CRESTOR ) 10 MG tablet Take 1 tablet (10 mg total) by mouth daily. Patient not taking: Reported on 07/11/2023 10/12/20   Raymona Caldwell, MD    Family History Family History  Problem Relation Age of Onset   Diabetes Mother    Heart disease Mother    Hypertension Mother    Hypertension Father    High Cholesterol Father    Hypertension Sister    Kidney disease Sister    Stroke Sister    Hypertension Sister    Breast cancer Maternal Aunt    Hearing loss Son    Cancer Other    Hypertension Other    Diabetes Other     Social History Social History   Tobacco Use   Smoking status: Never   Smokeless tobacco: Never  Vaping Use   Vaping status: Never Used   Substance Use Topics   Alcohol use: No   Drug use: No     Allergies   Bee venom and Reglan  [metoclopramide ]   Review of Systems Review of Systems  Constitutional: Negative.   HENT: Negative.    Respiratory: Negative.    Cardiovascular: Negative.   Gastrointestinal: Negative.   Genitourinary: Negative.   Musculoskeletal:  Positive  for back pain.  Psychiatric/Behavioral: Negative.       Physical Exam Triage Vital Signs ED Triage Vitals  Encounter Vitals Group     BP 09/28/23 1404 137/86     Systolic BP Percentile --      Diastolic BP Percentile --      Pulse Rate 09/28/23 1404 71     Resp 09/28/23 1404 20     Temp 09/28/23 1404 97.9 F (36.6 C)     Temp Source 09/28/23 1404 Oral     SpO2 09/28/23 1404 97 %     Weight --      Height --      Head Circumference --      Peak Flow --      Pain Score 09/28/23 1402 9     Pain Loc --      Pain Education --      Exclude from Growth Chart --    No data found.  Updated Vital Signs BP 137/86 (BP Location: Left Arm)   Pulse 71   Temp 97.9 F (36.6 C) (Oral)   Resp 20   LMP 02/06/2014 (LMP Unknown)   SpO2 97%   Visual Acuity Right Eye Distance:   Left Eye Distance:   Bilateral Distance:    Right Eye Near:   Left Eye Near:    Bilateral Near:     Physical Exam Constitutional:      Appearance: Normal appearance. She is normal weight.  Cardiovascular:     Rate and Rhythm: Normal rate and regular rhythm.  Musculoskeletal:     Comments: Mild spinous tenderness at the lumbar spine, and across the low back ; full rom of the bilateral legs/hips with minimal pain with movement.  Straight leg raise is negative.  Neurological:     General: No focal deficit present.     Mental Status: She is alert.  Psychiatric:        Mood and Affect: Mood normal.      UC Treatments / Results  Labs (all labs ordered are listed, but only abnormal results are displayed) Labs Reviewed - No data to  display  EKG   Radiology No results found.  Procedures Procedures (including critical care time)  Medications Ordered in UC Medications  ketorolac  (TORADOL ) 30 MG/ML injection 30 mg (has no administration in time range)    Initial Impression / Assessment and Plan / UC Course  I have reviewed the triage vital signs and the nursing notes.  Pertinent labs & imaging results that were available during my care of the patient were reviewed by me and considered in my medical decision making (see chart for details).   Final Clinical Impressions(s) / UC Diagnoses   Final diagnoses:  Acute bilateral low back pain without sciatica     Discharge Instructions      You were seen today for low back pain.  This appears to be muscular.  I have given you a shot of toradol  for pain.  I have sent out a muscle relaxer as well.  You may use motrin /aleve  for pain, as well as topical lidoderm  patches which are over the counter.  You may use heat/ice, which ever feels better.  Please return if not improving or worsening.   ED Prescriptions     Medication Sig Dispense Auth. Provider   tiZANidine (ZANAFLEX) 4 MG tablet Take 1 tablet (4 mg total) by mouth every 8 (eight) hours as needed. 30 tablet Lesle Ras, MD  PDMP not reviewed this encounter.   Lesle Ras, MD 09/28/23 1434

## 2023-10-04 ENCOUNTER — Encounter (HOSPITAL_COMMUNITY): Payer: Self-pay | Admitting: *Deleted

## 2023-10-04 ENCOUNTER — Ambulatory Visit (HOSPITAL_COMMUNITY)
Admission: EM | Admit: 2023-10-04 | Discharge: 2023-10-04 | Disposition: A | Discharge status: Home / Self Care | Attending: Family Medicine | Admitting: Family Medicine

## 2023-10-04 DIAGNOSIS — M545 Low back pain, unspecified: Secondary | ICD-10-CM

## 2023-10-04 LAB — POCT URINALYSIS DIP (MANUAL ENTRY)
Bilirubin, UA: NEGATIVE
Glucose, UA: NEGATIVE mg/dL
Leukocytes, UA: NEGATIVE
Nitrite, UA: NEGATIVE
Protein Ur, POC: NEGATIVE mg/dL
Spec Grav, UA: 1.025 (ref 1.010–1.025)
Urobilinogen, UA: 0.2 U/dL
pH, UA: 6 (ref 5.0–8.0)

## 2023-10-04 MED ORDER — PREDNISONE 20 MG PO TABS: 40.0000 mg | ORAL_TABLET | Freq: Every day | ORAL | 0 refills | Status: DC

## 2023-10-04 NOTE — ED Triage Notes (Signed)
 Pt seen 09/28/23 at [facility]. C/O pain across lower back onset approx 5 days ago. Denies injury. 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 Pt has concerns she may have UTI, but denies dysuria or polyuria. Has tried muscle relaxer, Aleve , hot showers. States the injection she received helped some, but then pain returned, and muscle relaxer causes too much drowsiness.

## 2023-10-04 NOTE — Discharge Instructions (Signed)
 HOME CARE INSTRUCTIONS: For many people, back pain returns. Since low back pain is rarely dangerous, it is often a condition that people can learn to manage on their own. Please remain active. It is stressful on the back to sit or stand in one place. Do not sit, drive, or stand in one place for more than 30 minutes at a time. Take short walks on level surfaces as soon as pain allows. Try to increase the length of time you walk each day. Do not stay in bed. Resting more than 1 or 2 days can delay your recovery. Do not avoid exercise or work. Your body is made to move. It is not dangerous to be active, even though your back may hurt. Your back will likely heal faster if you return to being active before your pain is gone. Over-the-counter medicines to reduce pain and inflammation are often the most helpful.  SEEK MEDICAL CARE IF: You have pain that is not relieved with rest or medicine. You have pain that does not improve in 1 week. You have new symptoms. You are generally not feeling well.  SEEK IMMEDIATE MEDICAL CARE IF: You have pain that radiates from your back into your legs. You develop new bowel or bladder control problems. You have unusual weakness or numbness in your arms or legs. You develop nausea or vomiting. You develop abdominal pain. You feel faint.  Be aware the medication prescribed today will cause your blood sugars to go up temporarily. Watch closely.

## 2023-10-05 ENCOUNTER — Ambulatory Visit (HOSPITAL_COMMUNITY)

## 2023-10-07 NOTE — ED Provider Notes (Signed)
 [facility]   [id number] 10/04/23 Arrival Time: 1535  ASSESSMENT & PLAN:  1. Acute right-sided low back pain without sciatica    Able to ambulate here and hemodynamically stable. No indication for imaging of back at this time given no trauma and normal neurological exam. Discussed.  Results for orders placed or performed during the hospital encounter of 10/04/23  POC urinalysis dipstick   Collection Time: 10/04/23  4:10 PM  Result Value Ref Range   Color, UA yellow yellow   Clarity, UA clear clear   Glucose, UA negative negative mg/dL   Bilirubin, UA negative negative   Ketones, POC UA trace (5) (A) negative mg/dL   Spec Grav, UA 5.784 6.962 - 1.025   Blood, UA trace-lysed (A) negative   pH, UA 6.0 5.0 - 8.0   Protein Ur, POC negative negative mg/dL   Urobilinogen, UA 0.2 0.2 or 1.0 E.U./dL   Nitrite, UA Negative Negative   Leukocytes, UA Negative Negative    Meds ordered this encounter  Medications   predniSONE  (DELTASONE ) 20 MG tablet    Sig: Take 2 tablets (40 mg total) by mouth daily.    Dispense:  10 tablet    Refill:  0   Work/school excuse note: provided. Medication sedation precautions given. Encourage ROM/movement as tolerated.  Recommend:  Follow-up Information     [facility].   Why: If worsening or failing to improve as anticipated. Contact information: [address] [place]  [address] [phone]                 Discharge Instructions      HOME CARE INSTRUCTIONS: For many people, back pain returns. Since low back pain is rarely dangerous, it is often a condition that people can learn to manage on their own. Please remain active. It is stressful on the back to sit or stand in one place. Do not sit, drive, or stand in one place for more than 30 minutes at a time. Take short walks on level surfaces as soon as pain allows. Try to increase the length of time you walk each day. Do not  stay in bed. Resting more than 1 or 2 days can delay your recovery. Do not avoid exercise or work. Your body is made to move. It is not dangerous to be active, even though your back may hurt. Your back will likely heal faster if you return to being active before your pain is gone. Over-the-counter medicines to reduce pain and inflammation are often the most helpful.  SEEK MEDICAL CARE IF: You have pain that is not relieved with rest or medicine. You have pain that does not improve in 1 week. You have new symptoms. You are generally not feeling well.  SEEK IMMEDIATE MEDICAL CARE IF: You have pain that radiates from your back into your legs. You develop new bowel or bladder control problems. You have unusual weakness or numbness in your arms or legs. You develop nausea or vomiting. You develop abdominal pain. You feel faint.  Be aware the medication prescribed today will cause your blood sugars to go up temporarily. Watch closely.   Reviewed expectations re: course of current medical issues. Questions answered. Outlined signs and symptoms indicating need for more acute intervention. Patient verbalized understanding. After Visit Summary given.   SUBJECTIVE: History from: patient.  Renee Knapp is a 48 y.o. female. Pt seen 09/28/23 at [facility]. C/O pain across lower back onset approx 5  days ago. Denies injury. 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 Pt has concerns she may have UTI, but denies dysuria or polyuria. Has tried muscle relaxer, Aleve , hot showers. States the injection she received helped some, but then pain returned, and muscle relaxer causes too much drowsiness.    OBJECTIVE:  Vitals:   10/04/23 1601  BP: 114/76  Pulse: 84  Resp: 16  Temp: 98.1 F (36.7 C)  TempSrc: Oral  SpO2: 94%    General appearance: alert; no distress HEENT: [place]; AT Neck: supple with FROM; without midline tenderness CV: regular Lungs: unlabored respirations; speaks full sentences without  difficulty Abdomen: soft, non-tender; non-distended Back: moderate and poorly localized tenderness to palpation over lumbar paraspinal musculature; FROM at waist; bruising: none; without midline tenderness Extremities: without edema; symmetrical without gross deformities; normal ROM of bilateral LE Skin: warm and dry Neurologic: normal gait; normal sensation and strength of bilateral LE Psychological: alert and cooperative; normal mood and affect  Labs: Results for orders placed or performed during the hospital encounter of 10/04/23  POC urinalysis dipstick   Collection Time: 10/04/23  4:10 PM  Result Value Ref Range   Color, UA yellow yellow   Clarity, UA clear clear   Glucose, UA negative negative mg/dL   Bilirubin, UA negative negative   Ketones, POC UA trace (5) (A) negative mg/dL   Spec Grav, UA 1.610 9.604 - 1.025   Blood, UA trace-lysed (A) negative   pH, UA 6.0 5.0 - 8.0   Protein Ur, POC negative negative mg/dL   Urobilinogen, UA 0.2 0.2 or 1.0 E.U./dL   Nitrite, UA Negative Negative   Leukocytes, UA Negative Negative   Labs Reviewed  POCT URINALYSIS DIP (MANUAL ENTRY) - Abnormal; Notable for the following components:      Result Value   Ketones, POC UA trace (5) (*)    Blood, UA trace-lysed (*)    All other components within normal limits    Imaging: No results found.  Allergies  Allergen Reactions   Bee Venom Anaphylaxis   Reglan  [Metoclopramide ] Nausea And Vomiting and Anxiety    Past Medical History:  Diagnosis Date   Anemia    History   Bell's palsy    06/2023   Diabetes mellitus without complication (HCC)    diet and exercise controlled - no meds   Hypertension    Obesity    Plantar fasciitis, bilateral    History - recvd cortisone injections bilateral- no current prob as 01/2014   SVD (spontaneous vaginal delivery)    x 2   Social History   Socioeconomic History   Marital status: Divorced    Spouse name: Not on file   Number of children:  Not on file   Years of education: Not on file   Highest education level: Not on file  Occupational History   Not on file  Tobacco Use   Smoking status: Never   Smokeless tobacco: Never  Vaping Use   Vaping status: Never Used  Substance and Sexual Activity   Alcohol use: No   Drug use: No   Sexual activity: Yes    Birth control/protection: Surgical  Other Topics Concern   Not on file  Social History Narrative   Not on file   Social Drivers of Health   Financial Resource Strain: Not on file  Food Insecurity: High Risk (03/29/2023)   Received from [facility]   Hunger Vital Sign    Worried About Running Out of Food in  the Last Year: Often true    Ran Out of Food in the Last Year: Often true  Transportation Needs: No Transportation Needs (03/29/2023)   Received from [facility]    In the past 12 months, has lack of reliable transportation kept you from medical appointments, meetings, work or from getting things needed for daily living? : No  Physical Activity: Not on file  Stress: Not on file  Social Connections: Unknown (03/20/2023)   Received from [facility]   Social Network    Social Network: Not on file  Intimate Partner Violence: Not At Risk (07/12/2023)   Received from [facility]   HITS    Over the last 12 months how often did your partner physically hurt you?: Never    Over the last 12 months how often did your partner insult you or talk down to you?: Never    Over the last 12 months how often did your partner threaten you with physical harm?: Never    Over the last 12 months how often did your partner scream or curse at you?: Never   Family History  Problem Relation Age of Onset   Diabetes Mother    Heart disease Mother    Hypertension Mother    Hypertension Father    High Cholesterol Father    Hypertension Sister    Kidney disease Sister    Stroke Sister    Hypertension Sister    Breast cancer Maternal Aunt    Hearing loss Son     Cancer Other    Hypertension Other    Diabetes Other    Past Surgical History:  Procedure Laterality Date   CYSTOSCOPY N/A 02/24/2014   Procedure: CYSTOSCOPY;  Surgeon: Artemisa Bile, MD;  Location: [facility];  Service: Gynecology;  Laterality: N/A;   DILATION AND CURETTAGE OF UTERUS     MAB   LAPAROSCOPIC ASSISTED VAGINAL HYSTERECTOMY N/A 02/24/2014   Procedure: LAPAROSCOPIC ASSISTED VAGINAL HYSTERECTOMY WITH BILATERAL SALPINGECTOMY ;  Surgeon: Artemisa Bile, MD;  Location: [facility];  Service: Gynecology;  Laterality: N/A;   TUBAL LIGATION     WISDOM TOOTH EXTRACTION        Afton Albright, MD 10/07/23 1022

## 2023-10-15 ENCOUNTER — Other Ambulatory Visit: Payer: Self-pay | Admitting: Family Medicine

## 2023-12-08 ENCOUNTER — Encounter: Payer: Self-pay | Admitting: Advanced Practice Midwife

## 2024-02-03 ENCOUNTER — Other Ambulatory Visit: Payer: Self-pay

## 2024-02-03 ENCOUNTER — Ambulatory Visit (HOSPITAL_COMMUNITY)
Admission: EM | Admit: 2024-02-03 | Discharge: 2024-02-03 | Disposition: A | Discharge status: Home / Self Care | Attending: Family Medicine | Admitting: Family Medicine

## 2024-02-03 ENCOUNTER — Encounter (HOSPITAL_COMMUNITY): Payer: Self-pay | Admitting: *Deleted

## 2024-02-03 DIAGNOSIS — M109 Gout, unspecified: Secondary | ICD-10-CM | POA: Diagnosis not present

## 2024-02-03 DIAGNOSIS — M79671 Pain in right foot: Secondary | ICD-10-CM

## 2024-02-03 MED ORDER — DEXAMETHASONE SODIUM PHOSPHATE 10 MG/ML IJ SOLN
10.0000 mg | Freq: Once | INTRAMUSCULAR | Status: AC
  Administered 2024-02-03: 10 mg via INTRAMUSCULAR

## 2024-02-03 MED ORDER — COLCHICINE 0.6 MG PO TABS: 0.6000 mg | ORAL_TABLET | Freq: Every day | ORAL | 0 refills | Status: AC | PRN

## 2024-02-03 MED ORDER — DEXAMETHASONE 10 MG/ML FOR PEDIATRIC ORAL USE
INTRAMUSCULAR | Status: AC
  Filled 2024-02-03: qty 1

## 2024-02-03 NOTE — Discharge Instructions (Signed)
 You have been given a shot of dexamethasone  10 mg, steroid.  Colchicine  0.6 mg--1 tablet daily as needed for gout pain

## 2024-02-03 NOTE — ED Triage Notes (Signed)
 PT reports RT foot pain that started Friday. . Pt denies any injury. Pt took ibuprofen  for pain with not relief.

## 2024-02-03 NOTE — ED Provider Notes (Signed)
 [facility]    CSN: [id number] Arrival date & time: 02/03/24  1305      History   Chief Complaint Chief Complaint  Patient presents with   Foot Pain    HPI Renee Knapp is a 48 y.o. female.    Foot Pain  Here for pain in her right foot.  It is bothering her around that first MTP and in the instep.  It began last night.  No trauma or fall.  She has had gout but has been several years since she had an attack.  She does have diabetes and her sugars have been mildly elevated lately.  She is allergic to Reglan   Last eGFR was greater than 60 in February.  Past Medical History:  Diagnosis Date   Anemia    History   Bell's palsy    06/2023   Diabetes mellitus without complication (HCC)    diet and exercise controlled - no meds   Hypertension    Obesity    Plantar fasciitis, bilateral    History - recvd cortisone injections bilateral- no current prob as 01/2014   SVD (spontaneous vaginal delivery)    x 2    Patient Active Problem List   Diagnosis Date Noted   Lateral epicondylitis, right elbow 03/15/2022   Probable carpal tunnel syndrome, right 03/15/2022   Hypertension associated with diabetes (HCC) 03/10/2021   GAD (generalized anxiety disorder) 08/30/2018   Vitamin D  deficiency 01/18/2018   Dyslipidemia associated with type 2 diabetes mellitus (HCC) 11/25/2017   Controlled type 2 diabetes mellitus without complication, without long-term current use of insulin  (HCC) 11/10/2017   S/P laparoscopic assisted vaginal hysterectomy (LAVH) 02/24/2014    Past Surgical History:  Procedure Laterality Date   CYSTOSCOPY N/A 02/24/2014   Procedure: CYSTOSCOPY;  Surgeon: Gigi Botts, MD;  Location: [facility];  Service: Gynecology;  Laterality: N/A;   DILATION AND CURETTAGE OF UTERUS     MAB   LAPAROSCOPIC ASSISTED VAGINAL HYSTERECTOMY N/A 02/24/2014   Procedure: LAPAROSCOPIC ASSISTED VAGINAL HYSTERECTOMY WITH BILATERAL SALPINGECTOMY ;  Surgeon: Gigi Botts, MD;   Location: [facility];  Service: Gynecology;  Laterality: N/A;   TUBAL LIGATION     WISDOM TOOTH EXTRACTION      OB History     Gravida  3   Para      Term      Preterm      AB  1   Living  2      SAB  1   IAB      Ectopic      Multiple      Live Births               Home Medications    Prior to Admission medications   Medication Sig Start Date End Date Taking? Authorizing Provider  colchicine  0.6 MG tablet Take 1 tablet (0.6 mg total) by mouth daily as needed (gout pain). 02/03/24  Yes Amro Winebarger, Sharlet POUR, MD  OZEMPIC , 0.25 OR 0.5 MG/DOSE, 2 MG/3ML SOPN Inject 0.5 mg once per week. 06/30/23  Yes [provider]  Accu-Chek Softclix Lancets lancets Use as instructed Patient not taking: Reported on 07/11/2023 01/09/23   Kingsley, Victoria K, DO  albuterol  (VENTOLIN  HFA) 108 [phone] Base) MCG/ACT inhaler Inhale 1-2 puffs into the lungs every 6 (six) hours as needed for wheezing or shortness of breath. Patient not taking: Reported on 07/11/2023 05/12/22   Raspet, Erin K, PA-C  Bayer Microlet Lancets lancets USE 1  SINGLE USE LANCETS AS NEEDED TO CHECK BLOOD SUGAR Patient not taking: Reported on 07/11/2023 04/24/19   Waddell Rake, MD  CONTOUR NEXT TEST test strip USE 1 LANCETS AS NEEDED TO CHECK BLOOD SUGAR Patient not taking: Reported on 07/11/2023 03/30/20   Waddell Rake, MD  EPINEPHrine  (EPIPEN  2-PAK) 0.3 mg/0.3 mL IJ SOAJ injection Inject 0.3 mg into the muscle as needed for anaphylaxis. 06/14/22   Kennyth Worth HERO, MD  glipiZIDE (GLUCOTROL) 5 MG tablet Take 5 mg by mouth daily before breakfast.    [provider]  Insulin  Pen Needle 31G X 5 MM MISC Use to inject insulin  bid Patient not taking: Reported on 07/11/2023 06/07/22   Kennyth Worth HERO, MD    Family History Family History  Problem Relation Age of Onset   Diabetes Mother    Heart disease Mother    Hypertension Mother    Hypertension Father    High Cholesterol Father    Hypertension Sister    Kidney  disease Sister    Stroke Sister    Hypertension Sister    Breast cancer Maternal Aunt    Hearing loss Son    Cancer Other    Hypertension Other    Diabetes Other     Social History Social History   Tobacco Use   Smoking status: Never   Smokeless tobacco: Never  Vaping Use   Vaping status: Never Used  Substance Use Topics   Alcohol use: No   Drug use: No     Allergies   Bee venom and Reglan  [metoclopramide ]   Review of Systems Review of Systems   Physical Exam Triage Vital Signs ED Triage Vitals  Encounter Vitals Group     BP 02/03/24 1448 124/85     Girls Systolic BP Percentile --      Girls Diastolic BP Percentile --      Boys Systolic BP Percentile --      Boys Diastolic BP Percentile --      Pulse Rate 02/03/24 1448 81     Resp 02/03/24 1448 18     Temp 02/03/24 1448 98.2 F (36.8 C)     Temp src --      SpO2 02/03/24 1448 96 %     Weight --      Height --      Head Circumference --      Peak Flow --      Pain Score 02/03/24 1446 9     Pain Loc --      Pain Education --      Exclude from Growth Chart --    No data found.  Updated Vital Signs BP 124/85   Pulse 81   Temp 98.2 F (36.8 C)   Resp 18   LMP 02/06/2014 (LMP Unknown)   SpO2 96%   Visual Acuity Right Eye Distance:   Left Eye Distance:   Bilateral Distance:    Right Eye Near:   Left Eye Near:    Bilateral Near:     Physical Exam Vitals reviewed.  Constitutional:      General: She is not in acute distress.    Appearance: She is not ill-appearing, toxic-appearing or diaphoretic.  Musculoskeletal:     Comments: There is mild erythema and warmth of the medial distal right foot.  It is also tender.  No fluctuance.  There is some mild swelling over the medial portion of the distal right foot.  Skin:    Coloration: Skin is not  jaundiced or pale.  Neurological:     Mental Status: She is alert and oriented to person, place, and time.  Psychiatric:        Behavior: Behavior  normal.      UC Treatments / Results  Labs (all labs ordered are listed, but only abnormal results are displayed) Labs Reviewed - No data to display  EKG   Radiology No results found.  Procedures Procedures (including critical care time)  Medications Ordered in UC Medications  dexamethasone  (DECADRON ) injection 10 mg (has no administration in time range)    Initial Impression / Assessment and Plan / UC Course  I have reviewed the triage vital signs and the nursing notes.  Pertinent labs & imaging results that were available during my care of the patient were reviewed by me and considered in my medical decision making (see chart for details).     I think this is most consistent with gout.  She said prednisone  pills make her feel depressed, so Decadron  injection is given here.  We discussed this still could have side effects for her.  We discussed being as good as she can on her low-carb diet.  Colchicine  is also sent in.  She will follow-up with her primary care at the end of the month Final Clinical Impressions(s) / UC Diagnoses   Final diagnoses:  Exacerbation of gout  Foot pain, right     Discharge Instructions      You have been given a shot of dexamethasone  10 mg, steroid.  Colchicine  0.6 mg--1 tablet daily as needed for gout pain       ED Prescriptions     Medication Sig Dispense Auth. Provider   colchicine  0.6 MG tablet Take 1 tablet (0.6 mg total) by mouth daily as needed (gout pain). 15 tablet Cung Masterson K, MD      PDMP not reviewed this encounter.   Vonna Sharlet POUR, MD 02/03/24 [phone]

## 2024-02-04 ENCOUNTER — Ambulatory Visit (HOSPITAL_COMMUNITY)

## 2024-03-15 ENCOUNTER — Ambulatory Visit (HOSPITAL_COMMUNITY)
Admission: EM | Admit: 2024-03-15 | Discharge: 2024-03-15 | Disposition: A | Discharge status: Home / Self Care | Attending: Family Medicine | Admitting: Family Medicine

## 2024-03-15 ENCOUNTER — Encounter (HOSPITAL_COMMUNITY): Payer: Self-pay

## 2024-03-15 DIAGNOSIS — B349 Viral infection, unspecified: Secondary | ICD-10-CM

## 2024-03-15 DIAGNOSIS — J069 Acute upper respiratory infection, unspecified: Secondary | ICD-10-CM | POA: Diagnosis not present

## 2024-03-15 LAB — POC COVID19/FLU A&B COMBO
Covid Antigen, POC: NEGATIVE
Influenza A Antigen, POC: NEGATIVE
Influenza B Antigen, POC: NEGATIVE

## 2024-03-15 MED ORDER — ONDANSETRON HCL 4 MG PO TABS: 4.0000 mg | ORAL_TABLET | Freq: Four times a day (QID) | ORAL | 0 refills | Status: DC

## 2024-03-15 NOTE — Discharge Instructions (Addendum)
-  Take 1 tablet of Zofran  (place under your tongue and let it dissolve) every 8 hours as needed for nausea - Continue to take Tylenol  and ibuprofen  for pain relief  The COVID and Flu tests were negative

## 2024-03-15 NOTE — ED Provider Notes (Signed)
 [facility]    CSN: [id number] Arrival date & time: 03/15/24  1330      History   Chief Complaint Chief Complaint  Patient presents with   Cough    HPI Renee Knapp is a 48 y.o. female.    Cough  4 days of nausea, headaches, body aches, chills, dry cough with diarrhea.  Works at [employer], but no known sick contacts.  Taking over-the-counter Tylenol /ibuprofen , but not since Tuesday.  Past Medical History:  Diagnosis Date   Anemia    History   Bell's palsy    06/2023   Diabetes mellitus without complication (HCC)    diet and exercise controlled - no meds   Hypertension    Obesity    Plantar fasciitis, bilateral    History - recvd cortisone injections bilateral- no current prob as 01/2014   SVD (spontaneous vaginal delivery)    x 2    Patient Active Problem List   Diagnosis Date Noted   Lateral epicondylitis, right elbow 03/15/2022   Probable carpal tunnel syndrome, right 03/15/2022   Hypertension associated with diabetes (HCC) 03/10/2021   GAD (generalized anxiety disorder) 08/30/2018   Vitamin D  deficiency 01/18/2018   Dyslipidemia associated with type 2 diabetes mellitus (HCC) 11/25/2017   Controlled type 2 diabetes mellitus without complication, without long-term current use of insulin  (HCC) 11/10/2017   S/P laparoscopic assisted vaginal hysterectomy (LAVH) 02/24/2014    Past Surgical History:  Procedure Laterality Date   CYSTOSCOPY N/A 02/24/2014   Procedure: CYSTOSCOPY;  Surgeon: Gigi Botts, MD;  Location: [facility];  Service: Gynecology;  Laterality: N/A;   DILATION AND CURETTAGE OF UTERUS     MAB   LAPAROSCOPIC ASSISTED VAGINAL HYSTERECTOMY N/A 02/24/2014   Procedure: LAPAROSCOPIC ASSISTED VAGINAL HYSTERECTOMY WITH BILATERAL SALPINGECTOMY ;  Surgeon: Gigi Botts, MD;  Location: [facility];  Service: Gynecology;  Laterality: N/A;   TUBAL LIGATION     WISDOM TOOTH EXTRACTION      OB History     Gravida  3   Para      Term      Preterm       AB  1   Living  2      SAB  1   IAB      Ectopic      Multiple      Live Births               Home Medications    Prior to Admission medications   Medication Sig Start Date End Date Taking? Authorizing Provider  losartan (COZAAR) 25 MG tablet Take 25 mg by mouth daily. 01/16/24  Yes [provider]  ondansetron  (ZOFRAN ) 4 MG tablet Take 1 tablet (4 mg total) by mouth every 6 (six) hours. 03/15/24  Yes Howell Lunger, DO  OZEMPIC , 0.25 OR 0.5 MG/DOSE, 2 MG/3ML SOPN Inject 0.5 mg once per week. 06/30/23  Yes [provider]  Accu-Chek Softclix Lancets lancets Use as instructed Patient not taking: Reported on 07/11/2023 01/09/23   Kingsley, Victoria K, DO  albuterol  (VENTOLIN  HFA) 108 (90 Base) MCG/ACT inhaler Inhale 1-2 puffs into the lungs every 6 (six) hours as needed for wheezing or shortness of breath. Patient not taking: Reported on 07/11/2023 05/12/22   Raspet, Erin K, PA-C  Bayer Microlet Lancets lancets USE 1 SINGLE USE LANCETS AS NEEDED TO CHECK BLOOD SUGAR Patient not taking: Reported on 07/11/2023 04/24/19   Waddell Rake, MD  colchicine  0.6 MG tablet Take 1  tablet (0.6 mg total) by mouth daily as needed (gout pain). 02/03/24   Vonna Sharlet POUR, MD  CONTOUR NEXT TEST test strip USE 1 LANCETS AS NEEDED TO CHECK BLOOD SUGAR Patient not taking: Reported on 07/11/2023 03/30/20   Waddell Rake, MD  EPINEPHrine  (EPIPEN  2-PAK) 0.3 mg/0.3 mL IJ SOAJ injection Inject 0.3 mg into the muscle as needed for anaphylaxis. 06/14/22   Kennyth Worth HERO, MD  Insulin  Pen Needle 31G X 5 MM MISC Use to inject insulin  bid Patient not taking: Reported on 07/11/2023 06/07/22   Kennyth Worth HERO, MD    Family History Family History  Problem Relation Age of Onset   Diabetes Mother    Heart disease Mother    Hypertension Mother    Hypertension Father    High Cholesterol Father    Hypertension Sister    Kidney disease Sister    Stroke Sister    Hypertension Sister     Breast cancer Maternal Aunt    Hearing loss Son    Cancer Other    Hypertension Other    Diabetes Other     Social History Social History   Tobacco Use   Smoking status: Never   Smokeless tobacco: Never  Vaping Use   Vaping status: Never Used  Substance Use Topics   Alcohol use: No   Drug use: No     Allergies   Bee venom and Reglan  [metoclopramide ]   Review of Systems Review of Systems  Respiratory:  Positive for cough.      Physical Exam Triage Vital Signs ED Triage Vitals [03/15/24 1519]  Encounter Vitals Group     BP      Girls Systolic BP Percentile      Girls Diastolic BP Percentile      Boys Systolic BP Percentile      Boys Diastolic BP Percentile      Pulse      Resp      Temp      Temp src      SpO2      Weight      Height      Head Circumference      Peak Flow      Pain Score 7     Pain Loc      Pain Education      Exclude from Growth Chart    No data found.  Updated Vital Signs BP 127/83 (BP Location: Right Arm)   Pulse 84   Temp 97.8 F (36.6 C) (Oral)   Resp 18   Ht 5' 4 (1.626 m)   Wt 200 lb 12.8 oz (91.1 kg)   LMP 02/06/2014 (LMP Unknown)   SpO2 95%   BMI 34.47 kg/m   Visual Acuity Right Eye Distance:   Left Eye Distance:   Bilateral Distance:    Right Eye Near:   Left Eye Near:    Bilateral Near:     Physical Exam Constitutional:      General: She is not in acute distress.    Appearance: Normal appearance.  HENT:     Right Ear: Tympanic membrane, ear canal and external ear normal.     Left Ear: Tympanic membrane, ear canal and external ear normal.     Mouth/Throat:     Pharynx: Posterior oropharyngeal erythema present. No oropharyngeal exudate.  Eyes:     Extraocular Movements: Extraocular movements intact.     Conjunctiva/sclera: Conjunctivae normal.     Pupils: Pupils are equal, round,  and reactive to light.  Cardiovascular:     Rate and Rhythm: Normal rate and regular rhythm.     Heart sounds: Normal  heart sounds.  Pulmonary:     Effort: Pulmonary effort is normal. No respiratory distress.     Breath sounds: Normal breath sounds. No stridor. No wheezing or rhonchi.  Abdominal:     General: Abdomen is flat. Bowel sounds are normal. There is no distension.     Palpations: Abdomen is soft.  Musculoskeletal:     Cervical back: Normal range of motion. No rigidity.  Lymphadenopathy:     Cervical: No cervical adenopathy.  Skin:    General: Skin is warm and dry.  Neurological:     Mental Status: She is alert.      UC Treatments / Results  Labs (all labs ordered are listed, but only abnormal results are displayed) Labs Reviewed  POC COVID19/FLU A&B COMBO    EKG   Radiology No results found.  Procedures Procedures (including critical care time)  Medications Ordered in UC Medications - No data to display  Initial Impression / Assessment and Plan / UC Course  I have reviewed the triage vital signs and the nursing notes.  Pertinent labs & imaging results that were available during my care of the patient were reviewed by me and considered in my medical decision making (see chart for details).     Afebrile, well-appearing 48 year old female with viral URI symptoms.  With normal vitals and benign exam, low concern for significant LRI including: Bronchitis, pneumonia.  Patient requesting flu/COVID testing. Zofran  prescribed for nausea.  Primarily suspect viral URI.   Patient handed off to Dr. Vonna @ 1600. COVID/flu swab pending at time of hand-off.  Anticipate discharge home with supportive care.  Final Clinical Impressions(s) / UC Diagnoses   Final diagnoses:  Viral URI with cough     Discharge Instructions      -Take 1 tablet of Zofran  (place under your tongue and let it dissolve) every 8 hours as needed for nausea - Continue to take Tylenol  and ibuprofen  for pain relief     ED Prescriptions     Medication Sig Dispense Auth. Provider   ondansetron  (ZOFRAN )  4 MG tablet Take 1 tablet (4 mg total) by mouth every 6 (six) hours. 12 tablet Howell Lunger, DO      PDMP not reviewed this encounter.   Howell Lunger, [place] 03/15/24 [phone]

## 2024-03-15 NOTE — ED Triage Notes (Signed)
 Nausea, headaches, body aches, chills, and slight dry cough onset 4 days ago. 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   Has not taken anything for current symptoms.

## 2024-04-02 ENCOUNTER — Ambulatory Visit (HOSPITAL_COMMUNITY)
Admission: EM | Admit: 2024-04-02 | Discharge: 2024-04-02 | Disposition: A | Discharge status: Home / Self Care | Attending: Emergency Medicine | Admitting: Emergency Medicine

## 2024-04-02 ENCOUNTER — Encounter (HOSPITAL_COMMUNITY): Payer: Self-pay | Admitting: *Deleted

## 2024-04-02 DIAGNOSIS — M7989 Other specified soft tissue disorders: Secondary | ICD-10-CM

## 2024-04-02 MED ORDER — PREDNISONE 20 MG PO TABS: 40.0000 mg | ORAL_TABLET | Freq: Every day | ORAL | 0 refills | Status: AC

## 2024-04-02 NOTE — ED Triage Notes (Signed)
 Pt states she has had bilateral hand itching and swelling since Sunday. The itching has stopped mostly but she states her right hand keeps going numb and she has decreased gripe strength.

## 2024-04-02 NOTE — Discharge Instructions (Addendum)
 As discussed your swelling and decreased strength could likely be related to some underlying arthritis.  Wear the Ace wraps to provide compression and help reduce swelling. Start taking 2 tablets of prednisone  once daily for 5 days for additional relief of this. You can alternate between 650 mg of Tylenol  and 400 to 600 mg of ibuprofen  every 6-8 hours as needed for any pain related to this. Rest, ice, and elevate to help reduce swelling as well. Follow up with [facility] if your swelling continues. Otherwise follow-up with your primary care provider or return here as needed.

## 2024-04-02 NOTE — ED Provider Notes (Signed)
 [facility]    CSN: [id number] Arrival date & time: 04/02/24  1734      History   Chief Complaint Chief Complaint  Patient presents with   Hand Problem    HPI Renee Knapp is a 48 y.o. female.   Patient presents with bilateral hand swelling that began on 11/9.  Patient states that she did have some itching to her hands as well, but states the itching has since stopped.  Patient states that her right hand seems to be worse than her left and she is having some decreased strength and a little bit of numbness to her right hand as well.  Patient denies any known injuries.  Patient denies any history of similar symptoms.  The history is provided by the patient and medical records.    Past Medical History:  Diagnosis Date   Anemia    History   Bell's palsy    06/2023   Diabetes mellitus without complication (HCC)    diet and exercise controlled - no meds   Hypertension    Obesity    Plantar fasciitis, bilateral    History - recvd cortisone injections bilateral- no current prob as 01/2014   SVD (spontaneous vaginal delivery)    x 2    Patient Active Problem List   Diagnosis Date Noted   Lateral epicondylitis, right elbow 03/15/2022   Probable carpal tunnel syndrome, right 03/15/2022   Hypertension associated with diabetes (HCC) 03/10/2021   GAD (generalized anxiety disorder) 08/30/2018   Vitamin D  deficiency 01/18/2018   Dyslipidemia associated with type 2 diabetes mellitus (HCC) 11/25/2017   Controlled type 2 diabetes mellitus without complication, without long-term current use of insulin  (HCC) 11/10/2017   S/P laparoscopic assisted vaginal hysterectomy (LAVH) 02/24/2014    Past Surgical History:  Procedure Laterality Date   CYSTOSCOPY N/A 02/24/2014   Procedure: CYSTOSCOPY;  Surgeon: Gigi Botts, MD;  Location: [facility];  Service: Gynecology;  Laterality: N/A;   DILATION AND CURETTAGE OF UTERUS     MAB   LAPAROSCOPIC ASSISTED VAGINAL HYSTERECTOMY N/A  02/24/2014   Procedure: LAPAROSCOPIC ASSISTED VAGINAL HYSTERECTOMY WITH BILATERAL SALPINGECTOMY ;  Surgeon: Gigi Botts, MD;  Location: [facility];  Service: Gynecology;  Laterality: N/A;   TUBAL LIGATION     WISDOM TOOTH EXTRACTION      OB History     Gravida  3   Para      Term      Preterm      AB  1   Living  2      SAB  1   IAB      Ectopic      Multiple      Live Births               Home Medications    Prior to Admission medications   Medication Sig Start Date End Date Taking? Authorizing Provider  losartan (COZAAR) 25 MG tablet Take 25 mg by mouth daily. 01/16/24  Yes [provider]  OZEMPIC , 0.25 OR 0.5 MG/DOSE, 2 MG/3ML SOPN Inject 0.5 mg once per week. 06/30/23  Yes [provider]  predniSONE  (DELTASONE ) 20 MG tablet Take 2 tablets (40 mg total) by mouth daily for 5 days. 04/02/24 04/07/24 Yes Johnie Flaming A, NP  Accu-Chek Softclix Lancets lancets Use as instructed Patient not taking: Reported on 07/11/2023 01/09/23   Kingsley, Victoria K, DO  albuterol  (VENTOLIN  HFA) 108 [phone] Base) MCG/ACT inhaler Inhale 1-2 puffs into the lungs  every 6 (six) hours as needed for wheezing or shortness of breath. Patient not taking: Reported on 07/11/2023 05/12/22   Raspet, Erin K, PA-C  Bayer Microlet Lancets lancets USE 1 SINGLE USE LANCETS AS NEEDED TO CHECK BLOOD SUGAR Patient not taking: Reported on 07/11/2023 04/24/19   Waddell Rake, MD  colchicine  0.6 MG tablet Take 1 tablet (0.6 mg total) by mouth daily as needed (gout pain). 02/03/24   Vonna Sharlet POUR, MD  CONTOUR NEXT TEST test strip USE 1 LANCETS AS NEEDED TO CHECK BLOOD SUGAR Patient not taking: Reported on 07/11/2023 03/30/20   Waddell Rake, MD  EPINEPHrine  (EPIPEN  2-PAK) 0.3 mg/0.3 mL IJ SOAJ injection Inject 0.3 mg into the muscle as needed for anaphylaxis. 06/14/22   Kennyth Worth HERO, MD  Insulin  Pen Needle 31G X 5 MM MISC Use to inject insulin  bid Patient not taking: Reported on 07/11/2023  06/07/22   Kennyth Worth HERO, MD  ondansetron  (ZOFRAN ) 4 MG tablet Take 1 tablet (4 mg total) by mouth every 6 (six) hours. 03/15/24   Howell Lunger, DO    Family History Family History  Problem Relation Age of Onset   Diabetes Mother    Heart disease Mother    Hypertension Mother    Hypertension Father    High Cholesterol Father    Hypertension Sister    Kidney disease Sister    Stroke Sister    Hypertension Sister    Breast cancer Maternal Aunt    Hearing loss Son    Cancer Other    Hypertension Other    Diabetes Other     Social History Social History   Tobacco Use   Smoking status: Never   Smokeless tobacco: Never  Vaping Use   Vaping status: Never Used  Substance Use Topics   Alcohol use: No   Drug use: No     Allergies   Bee venom, Reglan  [metoclopramide ], and Metformin  and related   Review of Systems Review of Systems  Per HPI  Physical Exam Triage Vital Signs ED Triage Vitals  Encounter Vitals Group     BP 04/02/24 1826 139/85     Girls Systolic BP Percentile --      Girls Diastolic BP Percentile --      Boys Systolic BP Percentile --      Boys Diastolic BP Percentile --      Pulse Rate 04/02/24 1826 78     Resp 04/02/24 1826 18     Temp 04/02/24 1826 98 F (36.7 C)     Temp Source 04/02/24 1826 Oral     SpO2 04/02/24 1826 96 %     Weight --      Height --      Head Circumference --      Peak Flow --      Pain Score 04/02/24 1825 7     Pain Loc --      Pain Education --      Exclude from Growth Chart --    No data found.  Updated Vital Signs BP 139/85 (BP Location: Left Arm)   Pulse 78   Temp 98 F (36.7 C) (Oral)   Resp 18   LMP 02/06/2014 (LMP Unknown)   SpO2 96%   Visual Acuity Right Eye Distance:   Left Eye Distance:   Bilateral Distance:    Right Eye Near:   Left Eye Near:    Bilateral Near:     Physical Exam Vitals and nursing note reviewed.  Constitutional:      General: She is awake. She is not in acute  distress.    Appearance: Normal appearance. She is well-developed and well-groomed. She is not ill-appearing.  Musculoskeletal:     Right hand: Swelling present. No deformity, tenderness or bony tenderness. Normal range of motion. Normal strength. Normal sensation. There is no disruption of two-point discrimination. Normal capillary refill. Normal pulse.     Left hand: Swelling present. No deformity, tenderness or bony tenderness. Normal range of motion. Normal strength. Normal sensation. There is no disruption of two-point discrimination. Normal capillary refill. Normal pulse.       Hands:     Comments: Swelling noted to lateral and dorsal aspects of bilateral hands over MCP joints and metacarpals  Skin:    General: Skin is warm and dry.  Neurological:     Mental Status: She is alert.  Psychiatric:        Behavior: Behavior is cooperative.      UC Treatments / Results  Labs (all labs ordered are listed, but only abnormal results are displayed) Labs Reviewed - No data to display  EKG   Radiology No results found.  Procedures Procedures (including critical care time)  Medications Ordered in UC Medications - No data to display  Initial Impression / Assessment and Plan / UC Course  I have reviewed the triage vital signs and the nursing notes.  Pertinent labs & imaging results that were available during my care of the patient were reviewed by me and considered in my medical decision making (see chart for details).     Patient is overall well-appearing.  Vitals are stable.  Exam findings could be related to some underlying arthritis.  Prescribed short course of prednisone  to assist with swelling related to this.  Given Ace wraps in clinic compression.  Discussed RICE therapy.  Given orthopedic follow-up if needed.  Discussed follow-up and return precautions. Final Clinical Impressions(s) / UC Diagnoses   Final diagnoses:  Bilateral hand swelling     Discharge Instructions       As discussed your swelling and decreased strength could likely be related to some underlying arthritis.  Wear the Ace wraps to provide compression and help reduce swelling. Start taking 2 tablets of prednisone  once daily for 5 days for additional relief of this. You can alternate between 650 mg of Tylenol  and 400 to 600 mg of ibuprofen  every 6-8 hours as needed for any pain related to this. Rest, ice, and elevate to help reduce swelling as well. Follow up with [facility] if your swelling continues. Otherwise follow-up with your primary care provider or return here as needed.    ED Prescriptions     Medication Sig Dispense Auth. Provider   predniSONE  (DELTASONE ) 20 MG tablet Take 2 tablets (40 mg total) by mouth daily for 5 days. 10 tablet Johnie Flaming A, NP      PDMP not reviewed this encounter.   Johnie Flaming A, NP 04/02/24 [phone]

## 2024-05-17 ENCOUNTER — Encounter (HOSPITAL_COMMUNITY): Payer: Self-pay

## 2024-05-17 ENCOUNTER — Ambulatory Visit (HOSPITAL_COMMUNITY): Admission: EM | Admit: 2024-05-17 | Discharge: 2024-05-17 | Disposition: A | Discharge status: Home / Self Care

## 2024-05-17 DIAGNOSIS — S39012A Strain of muscle, fascia and tendon of lower back, initial encounter: Secondary | ICD-10-CM | POA: Diagnosis not present

## 2024-05-17 DIAGNOSIS — M545 Low back pain, unspecified: Secondary | ICD-10-CM | POA: Diagnosis not present

## 2024-05-17 LAB — POCT URINE DIPSTICK
Bilirubin, UA: NEGATIVE
Glucose, UA: NEGATIVE mg/dL
Ketones, POC UA: NEGATIVE mg/dL
Leukocytes, UA: NEGATIVE
Nitrite, UA: NEGATIVE
Protein Ur, POC: NEGATIVE mg/dL
Spec Grav, UA: <=1.005 — AB
Urobilinogen, UA: 0.2 U/dL
pH, UA: 6

## 2024-05-17 MED ORDER — BACLOFEN 10 MG PO TABS: 10.0000 mg | ORAL_TABLET | Freq: Three times a day (TID) | ORAL | 0 refills | Status: AC

## 2024-05-17 NOTE — ED Provider Notes (Addendum)
 " [facility]    CSN: [id number] Arrival date & time: 05/17/24  1724      History   Chief Complaint Chief Complaint  Patient presents with   Flank Pain    HPI Renee Knapp is a 48 y.o. female.   Renee Knapp is a 48 y.o. female presenting for chief complaint of low back pain that started 2 days ago on May 15, 2024. Pain initially started to the left low back on 12/24, then migrated to the right low back yesterday. Pain is currently an 8/10, described as a sharp pain with movement, and is triggered/worsened by position changes. Pain has gradually become more constant over the last 2 days since starting. Renee Knapp denies recent known injuries to the low back, falls, radicular pain to the legs or abdomen, recent heavy lifting, previous spinal surgeries, saddle anesthesia, paresthesias to the legs, incontinence of bowel/bladder, or extremity weakness.  Renee Knapp has had urinary frequency over the last several days but admits to drinking 1 can of Dr.Pepper per day for the last few weeks. Renee Knapp drinks plenty of water . Denies dysuria, gross hematuria, urinary urgency, flank pain, dizziness, headaches, and fever/chills.  Renee Knapp is having normal bowel movements without N/V/D.  Renee Knapp has been taking Aleve  without much relief.    Flank Pain    Past Medical History:  Diagnosis Date   Anemia    History   Bell's palsy    06/2023   Diabetes mellitus without complication (HCC)    diet and exercise controlled - no meds   Hypertension    Obesity    Plantar fasciitis, bilateral    History - recvd cortisone injections bilateral- no current prob as 01/2014   SVD (spontaneous vaginal delivery)    x 2    Patient Active Problem List   Diagnosis Date Noted   Lateral epicondylitis, right elbow 03/15/2022   Probable carpal tunnel syndrome, right 03/15/2022   Hypertension associated with diabetes (HCC) 03/10/2021   GAD (generalized anxiety disorder) 08/30/2018   Vitamin D  deficiency  01/18/2018   Dyslipidemia associated with type 2 diabetes mellitus (HCC) 11/25/2017   Controlled type 2 diabetes mellitus without complication, without long-term current use of insulin  (HCC) 11/10/2017   S/P laparoscopic assisted vaginal hysterectomy (LAVH) 02/24/2014    Past Surgical History:  Procedure Laterality Date   CYSTOSCOPY N/A 02/24/2014   Procedure: CYSTOSCOPY;  Surgeon: Gigi Botts, MD;  Location: [facility];  Service: Gynecology;  Laterality: N/A;   DILATION AND CURETTAGE OF UTERUS     MAB   LAPAROSCOPIC ASSISTED VAGINAL HYSTERECTOMY N/A 02/24/2014   Procedure: LAPAROSCOPIC ASSISTED VAGINAL HYSTERECTOMY WITH BILATERAL SALPINGECTOMY ;  Surgeon: Gigi Botts, MD;  Location: [facility];  Service: Gynecology;  Laterality: N/A;   TUBAL LIGATION     WISDOM TOOTH EXTRACTION      OB History     Gravida  3   Para      Term      Preterm      AB  1   Living  2      SAB  1   IAB      Ectopic      Multiple      Live Births               Home Medications    Prior to Admission medications  Medication Sig Start Date End Date Taking? Authorizing Provider  baclofen  (LIORESAL ) 10 MG tablet Take 1 tablet (10 mg total) by mouth  3 (three) times daily. 05/17/24  Yes Enedelia Dorna HERO, FNP  OZEMPIC , 1 MG/DOSE, 4 MG/3ML SOPN Inject 1 mg into the skin once a week. 04/24/24  Yes [provider]  Semaglutide , 2 MG/DOSE, (OZEMPIC , 2 MG/DOSE,) 8 MG/3ML SOPN Inject 2 mg into the skin once a week. 05/07/24  Yes [provider]  Accu-Chek Softclix Lancets lancets Use as instructed Patient not taking: Reported on 07/11/2023 01/09/23   Kingsley, Victoria K, DO  albuterol  (VENTOLIN  HFA) 108 [phone] Base) MCG/ACT inhaler Inhale 1-2 puffs into the lungs every 6 (six) hours as needed for wheezing or shortness of breath. Patient not taking: Reported on 07/11/2023 05/12/22   Raspet, Erin K, PA-C  Bayer Microlet Lancets lancets USE 1 SINGLE USE LANCETS AS NEEDED TO CHECK BLOOD  SUGAR Patient not taking: Reported on 07/11/2023 04/24/19   Waddell Rake, MD  colchicine  0.6 MG tablet Take 1 tablet (0.6 mg total) by mouth daily as needed (gout pain). 02/03/24   Vonna Sharlet POUR, MD  CONTOUR NEXT TEST test strip USE 1 LANCETS AS NEEDED TO CHECK BLOOD SUGAR Patient not taking: Reported on 07/11/2023 03/30/20   Waddell Rake, MD  EPINEPHrine  (EPIPEN  2-PAK) 0.3 mg/0.3 mL IJ SOAJ injection Inject 0.3 mg into the muscle as needed for anaphylaxis. 06/14/22   Kennyth Worth HERO, MD  Insulin  Pen Needle 31G X 5 MM MISC Use to inject insulin  bid Patient not taking: Reported on 07/11/2023 06/07/22   Kennyth Worth HERO, MD  losartan (COZAAR) 25 MG tablet Take 25 mg by mouth daily. 01/16/24   [provider]    Family History Family History  Problem Relation Age of Onset   Diabetes Mother    Heart disease Mother    Hypertension Mother    Hypertension Father    High Cholesterol Father    Hypertension Sister    Kidney disease Sister    Stroke Sister    Hypertension Sister    Breast cancer Maternal Aunt    Hearing loss Son    Cancer Other    Hypertension Other    Diabetes Other     Social History Social History[1]   Allergies   Bee venom, Reglan  [metoclopramide ], and Metformin  and related   Review of Systems Review of Systems  Genitourinary:  Positive for flank pain.  Per HPI  Physical Exam Triage Vital Signs ED Triage Vitals  Encounter Vitals Group     BP 05/17/24 1918 (!) 134/90     Girls Systolic BP Percentile --      Girls Diastolic BP Percentile --      Boys Systolic BP Percentile --      Boys Diastolic BP Percentile --      Pulse Rate 05/17/24 1918 78     Resp 05/17/24 1918 18     Temp 05/17/24 1918 98.2 F (36.8 C)     Temp Source 05/17/24 1918 Oral     SpO2 05/17/24 1918 95 %     Weight 05/17/24 1917 200 lb (90.7 kg)     Height 05/17/24 1917 5' 4 (1.626 m)     Head Circumference --      Peak Flow --      Pain Score 05/17/24 1916 8     Pain  Loc --      Pain Education --      Exclude from Growth Chart --    No data found.  Updated Vital Signs BP (!) 134/90 (BP Location: Right Arm)   Pulse  78   Temp 98.2 F (36.8 C) (Oral)   Resp 18   Ht 5' 4 (1.626 m)   Wt 200 lb (90.7 kg)   LMP 02/06/2014   SpO2 95%   BMI 34.33 kg/m   Visual Acuity Right Eye Distance:   Left Eye Distance:   Bilateral Distance:    Right Eye Near:   Left Eye Near:    Bilateral Near:     Physical Exam Vitals and nursing note reviewed.  Constitutional:      Appearance: Renee Knapp is not ill-appearing or toxic-appearing.  HENT:     Head: Normocephalic and atraumatic.     Right Ear: Hearing and external ear normal.     Left Ear: Hearing and external ear normal.     Nose: Nose normal.     Mouth/Throat:     Lips: Pink.  Eyes:     General: Lids are normal. Vision grossly intact. Gaze aligned appropriately.     Extraocular Movements: Extraocular movements intact.     Conjunctiva/sclera: Conjunctivae normal.  Cardiovascular:     Rate and Rhythm: Normal rate and regular rhythm.     Heart sounds: Normal heart sounds, S1 normal and S2 normal.  Pulmonary:     Effort: Pulmonary effort is normal. No respiratory distress.     Breath sounds: Normal breath sounds and air entry.  Abdominal:     General: Bowel sounds are normal.     Palpations: Abdomen is soft.     Tenderness: There is no abdominal tenderness. There is no right CVA tenderness, left CVA tenderness or guarding.  Musculoskeletal:     Cervical back: Normal and neck supple.     Thoracic back: Normal.     Lumbar back: Tenderness present. No swelling, edema, deformity, signs of trauma, lacerations, spasms or bony tenderness. Normal range of motion. Negative right straight leg raise test and negative left straight leg raise test. No scoliosis.       Back:     Comments: Ambulatory with steady gait without difficulty/limp. Strength and sensation intact to distal bilateral lower extremities.    Skin:    General: Skin is warm and dry.     Capillary Refill: Capillary refill takes less than 2 seconds.     Findings: No rash.  Neurological:     General: No focal deficit present.     Mental Status: Renee Knapp is alert and oriented to person, place, and time. Mental status is at baseline.     Cranial Nerves: No dysarthria or facial asymmetry.  Psychiatric:        Mood and Affect: Mood normal.        Speech: Speech normal.        Behavior: Behavior normal.        Thought Content: Thought content normal.        Judgment: Judgment normal.      UC Treatments / Results  Labs (all labs ordered are listed, but only abnormal results are displayed) Labs Reviewed  POCT URINE DIPSTICK - Abnormal; Notable for the following components:      Result Value   Clarity, UA hazy (*)    Spec Grav, UA <=1.005 (*)    Blood, UA trace-intact (*)    All other components within normal limits    EKG   Radiology No results found.  Procedures Procedures (including critical care time)  Medications Ordered in UC Medications - No data to display  Initial Impression / Assessment and Plan / UC Course  I have reviewed the triage vital signs and the nursing notes.  Pertinent labs & imaging results that were available during my care of the patient were reviewed by me and considered in my medical decision making (see chart for details).   1. Acute bilateral low back pain without sciatica, lumbar strain Evaluation suggests pain is due to lumbar strain.   Urinalysis unremarkable for signs of UTI. Low suspicion for nephrolithiasis, though Renee Knapp has a history of this and Renee Knapp agrees today's presentation does not feel similar to when Renee Knapp has had kidney stones in the past.  Will manage this with rest, gentle ROM exercises, heat therapy, aleve  as needed for pain, and as needed use of muscle relaxer. Drowsiness precautions discussed  Imaging: no indication for imaging based on stable musculoskeletal exam  findings May follow-up with orthopedics as needed. Work/school excise note given.  Counseled patient on potential for adverse effects with medications prescribed/recommended today, strict ER and return-to-clinic precautions discussed, patient verbalized understanding.    Final Clinical Impressions(s) / UC Diagnoses   Final diagnoses:  Acute bilateral low back pain without sciatica  Strain of lumbar region, initial encounter     Discharge Instructions      Your back pain is likely due to a muscle strain which will improve on its own with time.   You may take ibuprofen  600mg  every 6 hours as needed for pain.   Take muscle relaxer as needed for muscle spasm, mostly take this at bedtime as this medicine can cause drowsiness.  Apply heat to the pulled muscle 20 minutes on 20 minutes off as needed, heat relaxes muscles.  Perform gentle exercises and stretches to area of tenderness.  I would like for you to rest, however I do not want you to avoid moving the area. Movement and stretching will help with healing.  Red flag symptoms to watch out for are numbness/tingling to the legs, weakness, loss of bowel/bladder control, and/or worsening pain that does not respond well to medicines.  Follow-up with your primary care provider or return to urgent care if your symptoms do not improve in the next 3 to 4 days with medications and interventions recommended today. If your symptoms are severe (red flag), please go to the emergency room.        ED Prescriptions     Medication Sig Dispense Auth. Provider   baclofen  (LIORESAL ) 10 MG tablet Take 1 tablet (10 mg total) by mouth 3 (three) times daily. 30 each Enedelia Dorna HERO, FNP      PDMP not reviewed this encounter.    Enedelia Dorna HERO, [place] 05/19/24 1211     [1]  Social History Tobacco Use   Smoking status: Never   Smokeless tobacco: Never  Vaping Use   Vaping status: Never Used  Substance Use Topics   Alcohol use:  No   Drug use: No     Enedelia Dorna HERO, FNP 05/19/24 1213  "

## 2024-05-17 NOTE — Discharge Instructions (Signed)
 Your back pain is likely due to a muscle strain which will improve on its own with time.   You may take ibuprofen  600mg  every 6 hours as needed for pain.   Take muscle relaxer as needed for muscle spasm, mostly take this at bedtime as this medicine can cause drowsiness.  Apply heat to the pulled muscle 20 minutes on 20 minutes off as needed, heat relaxes muscles.  Perform gentle exercises and stretches to area of tenderness.  I would like for you to rest, however I do not want you to avoid moving the area. Movement and stretching will help with healing.  Red flag symptoms to watch out for are numbness/tingling to the legs, weakness, loss of bowel/bladder control, and/or worsening pain that does not respond well to medicines.  Follow-up with your primary care provider or return to urgent care if your symptoms do not improve in the next 3 to 4 days with medications and interventions recommended today. If your symptoms are severe (red flag), please go to the emergency room.

## 2024-05-17 NOTE — ED Triage Notes (Signed)
 Patient having bilateral flank and low back pain onset this past Wednesday. No known falls, injuries, or strains. Patient having urinary frequency but denies any dysuria. States the pain started in the left flank and then spread.   Patient tried Aleve  with little relief of pain.

## 2024-06-05 ENCOUNTER — Other Ambulatory Visit: Payer: Self-pay

## 2024-06-05 ENCOUNTER — Ambulatory Visit (HOSPITAL_COMMUNITY)
Admission: EM | Admit: 2024-06-05 | Discharge: 2024-06-05 | Disposition: A | Discharge status: Home / Self Care | Attending: Family Medicine | Admitting: Family Medicine

## 2024-06-05 DIAGNOSIS — R519 Headache, unspecified: Secondary | ICD-10-CM

## 2024-06-05 DIAGNOSIS — I10 Essential (primary) hypertension: Secondary | ICD-10-CM

## 2024-06-05 MED ORDER — LOSARTAN POTASSIUM 25 MG PO TABS: 25.0000 mg | ORAL_TABLET | Freq: Every day | ORAL | 2 refills | Status: AC

## 2024-06-05 MED ORDER — AMOXICILLIN 875 MG PO TABS: 875.0000 mg | ORAL_TABLET | Freq: Two times a day (BID) | ORAL | 0 refills | Status: AC

## 2024-06-05 NOTE — ED Triage Notes (Signed)
 Blood pressure today was 174/101 Blood sugar was 112  Patient points to middle of forehead as location of headache pain.  Complains of stuffy head  Patient has not had any medicine for this.    Took a covid test this morning and test was negative.  Patient has had mild nausea.    Patient talks of some medicine doses recently being changes and concerned these changes may have played a role in todays complaints  Ran out of blood pressure medication one week ago

## 2024-06-06 NOTE — ED Provider Notes (Signed)
 " [facility] CARE CENTER   [id number] 06/05/24 Arrival Time: 1703  ASSESSMENT & PLAN:  1. Acute nonintractable headache, unspecified headache type   2. Elevated blood pressure reading with diagnosis of hypertension    Suspect frontal sinusitis. BP a little elevated here. Meds ordered this encounter  Medications   losartan  (COZAAR ) 25 MG tablet    Sig: Take 1 tablet (25 mg total) by mouth daily.    Dispense:  30 tablet    Refill:  2   amoxicillin  (AMOXIL ) 875 MG tablet    Sig: Take 1 tablet (875 mg total) by mouth 2 (two) times daily for 7 days.    Dispense:  14 tablet    Refill:  0     OTC symptom care as needed.  Meds ordered this encounter  Medications   losartan  (COZAAR ) 25 MG tablet    Sig: Take 1 tablet (25 mg total) by mouth daily.    Dispense:  30 tablet    Refill:  2   amoxicillin  (AMOXIL ) 875 MG tablet    Sig: Take 1 tablet (875 mg total) by mouth 2 (two) times daily for 7 days.    Dispense:  14 tablet    Refill:  0     Follow-up Information     [facility] at [facility].   Specialty: Urgent Care Why: As needed. Contact information: [address] [place]  [id number] [phone]        [facility] Emergency Department at [facility].   Specialty: Emergency Medicine Why: If your headache worsens in any way. Contact information: [address] [place]  [address] [phone]                Reviewed expectations re: course of current medical issues. Questions answered. Outlined signs and symptoms indicating need for more acute intervention. Understanding verbalized. After Visit Summary given.   SUBJECTIVE: History from: Patient. Renee Knapp is a 49 y.o. female. Blood pressure today was 174/101 Blood sugar was 112. Patient points to middle of forehead as location of headache pain.  Complains of stuffy head. Patient has not had any medicine for this.   Took a covid test  this morning and test was negative.  Patient has had mild nausea.   Patient talks of some medicine doses recently being changes and concerned these changes may have played a role in todays complaints. Ran out of blood pressure medication one week ago. Denies: fever. Normal PO intake without n/v/d.  OBJECTIVE:  Vitals:   06/05/24 1903  BP: (!) 146/88  Pulse: 81  Resp: 20  Temp: 98.5 F (36.9 C)  TempSrc: Oral  SpO2: 97%    General appearance: alert; no distress Eyes: PERRLA; EOMI; conjunctiva normal HENT: [place]; AT; with nasal congestion; frontal sinus TTP Neck: supple  Lungs: speaks full sentences without difficulty; unlabored Extremities: no edema Skin: warm and dry Neurologic: normal gait Psychological: alert and cooperative; normal mood and affect  Labs: Results for orders placed or performed during the hospital encounter of 05/17/24  POC Urinalysis Dipstick   Collection Time: 05/17/24  7:51 PM  Result Value Ref Range   Color, UA yellow yellow   Clarity, UA hazy (A) clear   Glucose, UA negative negative mg/dL   Bilirubin, UA negative negative   Ketones, POC UA negative negative mg/dL   Spec Grav, UA <=8.994 (A) 1.010 - 1.025   Blood, UA trace-intact (A) negative   pH, UA 6.0 5.0 -  8.0   Protein Ur, POC negative negative mg/dL   Urobilinogen, UA 0.2 0.2 or 1.0 E.U./dL   Nitrite, UA Negative Negative   Leukocytes, UA Negative Negative   Labs Reviewed - No data to display  Imaging: No results found.  Allergies[1]  Past Medical History:  Diagnosis Date   Anemia    History   Bell's palsy    06/2023   Diabetes mellitus without complication (HCC)    diet and exercise controlled - no meds   Hypertension    Obesity    Plantar fasciitis, bilateral    History - recvd cortisone injections bilateral- no current prob as 01/2014   SVD (spontaneous vaginal delivery)    x 2   Social History   Socioeconomic History   Marital status: Divorced    Spouse name: Not on  file   Number of children: Not on file   Years of education: Not on file   Highest education level: Not on file  Occupational History   Not on file  Tobacco Use   Smoking status: Never   Smokeless tobacco: Never  Vaping Use   Vaping status: Never Used  Substance and Sexual Activity   Alcohol use: No   Drug use: No   Sexual activity: Yes    Birth control/protection: Surgical  Other Topics Concern   Not on file  Social History Narrative   Not on file   Social Drivers of Health   Tobacco Use: Low Risk (05/17/2024)   Patient History    Smoking Tobacco Use: Never    Smokeless Tobacco Use: Never    Passive Exposure: Not on file  Financial Resource Strain: Medium Risk (03/19/2024)   Received from [facility]   Overall Financial Resource Strain (CARDIA)    How hard is it for you to pay for the very basics like food, housing, medical care, and heating?: Somewhat hard  Food Insecurity: Food Insecurity Present (03/19/2024)   Received from [facility]   Epic    Within the past 12 months, you worried that your food would run out before you got the money to buy more.: Often true    Within the past 12 months, the food you bought just didn't last and you didn't have money to get more.: Often true  Transportation Needs: No Transportation Needs (03/19/2024)   Received from [facility]    In the past 12 months, has lack of transportation kept you from medical appointments or from getting medications?: No    In the past 12 months, has lack of transportation kept you from meetings, work, or from getting things needed for daily living?: No  Physical Activity: Insufficiently Active (03/19/2024)   Received from [facility]   Exercise Vital Sign    On average, how many days per week do you engage in moderate to strenuous exercise (like a brisk walk)?: 3 days    On average, how many minutes do you engage in exercise at this level?: 20 min  Stress: Stress Concern Present  (03/19/2024)   Received from [facility] of Occupational Health - Occupational Stress Questionnaire    Do you feel stress - tense, restless, nervous, or anxious, or unable to sleep at night because your mind is troubled all the time - these days?: To some extent  Social Connections: Socially Integrated (03/19/2024)   Received from [facility]   Social Network    How would you rate your social network (family,  work, friends)?: Good participation with social networks  Intimate Partner Violence: Not At Risk (05/19/2024)   Received from [facility]   HITS    Over the last 12 months how often did your partner physically hurt you?: Never    Over the last 12 months how often did your partner insult you or talk down to you?: Never    Over the last 12 months how often did your partner threaten you with physical harm?: Never    Over the last 12 months how often did your partner scream or curse at you?: Never  Depression (PHQ2-9): Low Risk (07/11/2023)   Depression (PHQ2-9)    PHQ-2 Score: 0  Alcohol Screen: Not on file  Housing: High Risk (03/19/2024)   [facility] from [facility]    In the last 12 months, was there a time when you were not able to pay the mortgage or rent on time?: Yes    In the past 12 months, how many times have you moved where you were living?: 0    At any time in the past 12 months, were you homeless or living in a shelter (including now)?: No  Utilities: At Risk (03/19/2024)   Received from [facility]    In the past 12 months has the electric, gas, oil, or water  company threatened to shut off services in your home?: Yes  Health Literacy: Not on file   Family History  Problem Relation Age of Onset   Diabetes Mother    Heart disease Mother    Hypertension Mother    Hypertension Father    High Cholesterol Father    Hypertension Sister    Kidney disease Sister    Stroke Sister    Hypertension Sister    Breast cancer Maternal  Aunt    Hearing loss Son    Cancer Other    Hypertension Other    Diabetes Other    Past Surgical History:  Procedure Laterality Date   CYSTOSCOPY N/A 02/24/2014   Procedure: CYSTOSCOPY;  Surgeon: Gigi Botts, MD;  Location: [facility];  Service: Gynecology;  Laterality: N/A;   DILATION AND CURETTAGE OF UTERUS     MAB   LAPAROSCOPIC ASSISTED VAGINAL HYSTERECTOMY N/A 02/24/2014   Procedure: LAPAROSCOPIC ASSISTED VAGINAL HYSTERECTOMY WITH BILATERAL SALPINGECTOMY ;  Surgeon: Gigi Botts, MD;  Location: [facility];  Service: Gynecology;  Laterality: N/A;   TUBAL LIGATION     WISDOM TOOTH EXTRACTION        [1]  Allergies Allergen Reactions   Bee Venom Anaphylaxis   Reglan  [Metoclopramide ] Nausea And Vomiting and Anxiety   Metformin  And Related Diarrhea     Rolinda Rogue, MD 06/06/24 [phone]  "

## 2024-06-25 ENCOUNTER — Ambulatory Visit: Payer: Self-pay

## 2024-06-27 ENCOUNTER — Ambulatory Visit

## 2024-07-04 ENCOUNTER — Ambulatory Visit: Payer: Self-pay
